# Patient Record
Sex: Female | Born: 1945
Health system: Southern US, Community
[De-identification: ages and names within clinical notes are randomized; demographics above are authoritative.]

## PROBLEM LIST (undated history)

## (undated) ENCOUNTER — Ambulatory Visit: Admission: EM

## (undated) DIAGNOSIS — N393 Stress incontinence (female) (male): Secondary | ICD-10-CM

## (undated) DIAGNOSIS — M1990 Unspecified osteoarthritis, unspecified site: Secondary | ICD-10-CM

## (undated) DIAGNOSIS — G629 Polyneuropathy, unspecified: Secondary | ICD-10-CM

## (undated) DIAGNOSIS — M797 Fibromyalgia: Secondary | ICD-10-CM

## (undated) DIAGNOSIS — I10 Essential (primary) hypertension: Secondary | ICD-10-CM

## (undated) DIAGNOSIS — R002 Palpitations: Secondary | ICD-10-CM

## (undated) DIAGNOSIS — K219 Gastro-esophageal reflux disease without esophagitis: Secondary | ICD-10-CM

## (undated) DIAGNOSIS — R0602 Shortness of breath: Secondary | ICD-10-CM

## (undated) DIAGNOSIS — R51 Headache: Secondary | ICD-10-CM

## (undated) DIAGNOSIS — I499 Cardiac arrhythmia, unspecified: Secondary | ICD-10-CM

## (undated) DIAGNOSIS — F419 Anxiety disorder, unspecified: Secondary | ICD-10-CM

## (undated) HISTORY — PX: CYSTOSCOPY: SUR368

## (undated) HISTORY — PX: FOOT SURGERY: SHX648

## (undated) HISTORY — PX: ABDOMINAL HYSTERECTOMY: SHX81

## (undated) HISTORY — PX: ARTHROSCOPY KNEE W/ DRILLING: SUR92

---

## 1981-04-19 HISTORY — PX: BARTHOLIN GLAND CYST EXCISION: SHX565

## 1987-04-20 HISTORY — PX: CHOLECYSTECTOMY: SHX55

## 2003-07-16 ENCOUNTER — Encounter: Admission: RE | Admit: 2003-07-16 | Discharge: 2003-10-14 | Payer: Self-pay | Admitting: Internal Medicine

## 2004-10-18 ENCOUNTER — Encounter: Admission: RE | Admit: 2004-10-18 | Discharge: 2004-10-18 | Payer: Self-pay | Admitting: Internal Medicine

## 2004-11-13 ENCOUNTER — Encounter: Admission: RE | Admit: 2004-11-13 | Discharge: 2004-11-13 | Payer: Self-pay | Admitting: Neurosurgery

## 2009-12-30 ENCOUNTER — Encounter: Admission: RE | Admit: 2009-12-30 | Discharge: 2009-12-30 | Payer: Self-pay | Admitting: *Deleted

## 2010-01-19 ENCOUNTER — Encounter
Admission: RE | Admit: 2010-01-19 | Discharge: 2010-03-24 | Payer: Self-pay | Source: Home / Self Care | Attending: *Deleted | Admitting: *Deleted

## 2010-04-19 HISTORY — PX: SPINAL FUSION: SHX223

## 2010-04-27 ENCOUNTER — Encounter
Admission: RE | Admit: 2010-04-27 | Discharge: 2010-04-27 | Payer: Self-pay | Source: Home / Self Care | Attending: Orthopedic Surgery | Admitting: Orthopedic Surgery

## 2011-01-26 ENCOUNTER — Other Ambulatory Visit: Payer: Self-pay | Admitting: Neurological Surgery

## 2011-01-26 DIAGNOSIS — M545 Low back pain, unspecified: Secondary | ICD-10-CM

## 2011-01-26 DIAGNOSIS — M7918 Myalgia, other site: Secondary | ICD-10-CM

## 2011-02-01 ENCOUNTER — Ambulatory Visit
Admission: RE | Admit: 2011-02-01 | Discharge: 2011-02-01 | Disposition: A | Discharge status: Home / Self Care | Payer: Medicare Other | Source: Ambulatory Visit | Attending: Neurological Surgery | Admitting: Neurological Surgery

## 2011-02-01 DIAGNOSIS — M545 Low back pain, unspecified: Secondary | ICD-10-CM

## 2011-02-01 DIAGNOSIS — M7918 Myalgia, other site: Secondary | ICD-10-CM

## 2011-03-12 ENCOUNTER — Encounter (HOSPITAL_COMMUNITY): Payer: Self-pay | Admitting: Pharmacy Technician

## 2011-03-17 ENCOUNTER — Encounter (HOSPITAL_COMMUNITY)
Admission: RE | Admit: 2011-03-17 | Discharge: 2011-03-17 | Disposition: A | Discharge status: Home / Self Care | Payer: Medicare Other | Source: Ambulatory Visit | Attending: Neurological Surgery | Admitting: Neurological Surgery

## 2011-03-17 ENCOUNTER — Other Ambulatory Visit: Payer: Self-pay

## 2011-03-17 ENCOUNTER — Encounter (HOSPITAL_COMMUNITY): Payer: Self-pay

## 2011-03-17 HISTORY — DX: Cardiac arrhythmia, unspecified: I49.9

## 2011-03-17 HISTORY — DX: Gastro-esophageal reflux disease without esophagitis: K21.9

## 2011-03-17 HISTORY — DX: Essential (primary) hypertension: I10

## 2011-03-17 HISTORY — DX: Anxiety disorder, unspecified: F41.9

## 2011-03-17 HISTORY — DX: Unspecified osteoarthritis, unspecified site: M19.90

## 2011-03-17 HISTORY — DX: Headache: R51

## 2011-03-17 HISTORY — DX: Shortness of breath: R06.02

## 2011-03-17 LAB — CBC
HCT: 36.1 % (ref 36.0–46.0)
Hemoglobin: 12 g/dL (ref 12.0–15.0)
MCH: 28.8 pg (ref 26.0–34.0)
MCHC: 33.2 g/dL (ref 30.0–36.0)
MCV: 86.6 fL (ref 78.0–100.0)
Platelets: 408 10*3/uL — ABNORMAL HIGH (ref 150–400)
RBC: 4.17 MIL/uL (ref 3.87–5.11)
RDW: 14.4 % (ref 11.5–15.5)
WBC: 7.9 10*3/uL (ref 4.0–10.5)

## 2011-03-17 LAB — DIFFERENTIAL
Basophils Absolute: 0 10*3/uL (ref 0.0–0.1)
Basophils Relative: 0 % (ref 0–1)
Eosinophils Absolute: 0.2 10*3/uL (ref 0.0–0.7)
Eosinophils Relative: 3 % (ref 0–5)
Lymphocytes Relative: 33 % (ref 12–46)
Lymphs Abs: 2.7 10*3/uL (ref 0.7–4.0)
Monocytes Absolute: 0.5 10*3/uL (ref 0.1–1.0)
Monocytes Relative: 7 % (ref 3–12)
Neutro Abs: 4.5 10*3/uL (ref 1.7–7.7)
Neutrophils Relative %: 57 % (ref 43–77)

## 2011-03-17 LAB — APTT: aPTT: 29 seconds (ref 24–37)

## 2011-03-17 LAB — TYPE AND SCREEN
ABO/RH(D): O NEG
Antibody Screen: NEGATIVE

## 2011-03-17 LAB — BASIC METABOLIC PANEL
BUN: 20 mg/dL (ref 6–23)
CO2: 28 mEq/L (ref 19–32)
Calcium: 9.6 mg/dL (ref 8.4–10.5)
Chloride: 94 mEq/L — ABNORMAL LOW (ref 96–112)
Creatinine, Ser: 1.12 mg/dL — ABNORMAL HIGH (ref 0.50–1.10)
GFR calc Af Amer: 58 mL/min — ABNORMAL LOW (ref >90)
GFR calc non Af Amer: 50 mL/min — ABNORMAL LOW (ref >90)
Glucose, Bld: 127 mg/dL — ABNORMAL HIGH (ref 70–99)
Potassium: 4.3 mEq/L (ref 3.5–5.1)
Sodium: 136 mEq/L (ref 135–145)

## 2011-03-17 LAB — PROTIME-INR
INR: 0.91 (ref 0.00–1.49)
Prothrombin Time: 12.4 seconds (ref 11.6–15.2)

## 2011-03-17 LAB — SURGICAL PCR SCREEN
MRSA, PCR: NEGATIVE
Staphylococcus aureus: NEGATIVE

## 2011-03-17 LAB — ABO/RH: ABO/RH(D): O NEG

## 2011-03-17 MED ORDER — DEXAMETHASONE SODIUM PHOSPHATE 4 MG/ML IJ SOLN: 10.0000 mg | Freq: Once | INTRAMUSCULAR | Status: DC

## 2011-03-17 MED ORDER — CHLORHEXIDINE GLUCONATE 4 % EX LIQD: Freq: Two times a day (BID) | CUTANEOUS | Status: DC

## 2011-03-17 MED ORDER — CEFAZOLIN SODIUM 1-5 GM-% IV SOLN: 1.0000 g | INTRAVENOUS | Status: DC

## 2011-03-17 NOTE — Pre-Procedure Instructions (Signed)
20 Vanessa Frank  03/17/2011   Your procedure is scheduled on:  03/26/2011  Report to [facility] at 1045 AM.  Call this number if you have problems the morning of surgery: [phone]   Remember:   Do not eat food:After Midnight.  May have clear liquids: up to 4 Hours before arrival.  Clear liquids include soda, tea, black coffee, apple or grape juice, broth.  Take these medicines the morning of surgery with A SIP OF WATER: LORCET  OXYCODONE  PRILOSEC  REGLAN  ZOLOFT   Do not wear jewelry, make-up or nail polish.  Do not wear lotions, powders, or perfumes. You may wear deodorant.  Do not shave 48 hours prior to surgery.  Do not bring valuables to the hospital.  Contacts, dentures or bridgework may not be worn into surgery.  Leave suitcase in the car. After surgery it may be brought to your room.  For patients admitted to the hospital, checkout time is 11:00 AM the day of discharge.   Patients discharged the day of surgery will not be allowed to drive home.  Name and phone number of your driver: RONMyrtie Hawk [phone]  Special Instructions: CHG Shower Use Special Wash: 1/2 bottle night before surgery and 1/2 bottle morning of surgery.   Please read over the following fact sheets that you were given: Pain Booklet, Coughing and Deep Breathing, Blood Transfusion Information, MRSA Information and Surgical Site Infection Prevention

## 2011-03-26 ENCOUNTER — Encounter (HOSPITAL_COMMUNITY): Payer: Self-pay | Admitting: Anesthesiology

## 2011-03-26 ENCOUNTER — Encounter (HOSPITAL_COMMUNITY): Payer: Self-pay | Admitting: *Deleted

## 2011-03-26 ENCOUNTER — Inpatient Hospital Stay (HOSPITAL_COMMUNITY): Payer: Medicare Other

## 2011-03-26 ENCOUNTER — Encounter (HOSPITAL_COMMUNITY): Payer: Self-pay | Admitting: Neurological Surgery

## 2011-03-26 ENCOUNTER — Encounter (HOSPITAL_COMMUNITY)
Admission: RE | Disposition: A | Discharge status: Home / Self Care | Payer: Self-pay | Source: Ambulatory Visit | Attending: Neurological Surgery

## 2011-03-26 ENCOUNTER — Inpatient Hospital Stay (HOSPITAL_COMMUNITY)
Admission: RE | Admit: 2011-03-26 | Discharge: 2011-03-29 | DRG: 455 | Disposition: A | Discharge status: Home / Self Care | Payer: Medicare Other | Source: Ambulatory Visit | Attending: Neurological Surgery | Admitting: Neurological Surgery

## 2011-03-26 ENCOUNTER — Inpatient Hospital Stay (HOSPITAL_COMMUNITY): Payer: Medicare Other | Admitting: Anesthesiology

## 2011-03-26 DIAGNOSIS — E119 Type 2 diabetes mellitus without complications: Secondary | ICD-10-CM | POA: Diagnosis present

## 2011-03-26 DIAGNOSIS — M5137 Other intervertebral disc degeneration, lumbosacral region: Principal | ICD-10-CM | POA: Diagnosis present

## 2011-03-26 DIAGNOSIS — Z981 Arthrodesis status: Secondary | ICD-10-CM

## 2011-03-26 DIAGNOSIS — M129 Arthropathy, unspecified: Secondary | ICD-10-CM | POA: Diagnosis present

## 2011-03-26 DIAGNOSIS — K5900 Constipation, unspecified: Secondary | ICD-10-CM | POA: Diagnosis not present

## 2011-03-26 DIAGNOSIS — F411 Generalized anxiety disorder: Secondary | ICD-10-CM | POA: Diagnosis present

## 2011-03-26 DIAGNOSIS — I10 Essential (primary) hypertension: Secondary | ICD-10-CM | POA: Diagnosis present

## 2011-03-26 DIAGNOSIS — K219 Gastro-esophageal reflux disease without esophagitis: Secondary | ICD-10-CM | POA: Diagnosis present

## 2011-03-26 DIAGNOSIS — M51379 Other intervertebral disc degeneration, lumbosacral region without mention of lumbar back pain or lower extremity pain: Principal | ICD-10-CM | POA: Diagnosis present

## 2011-03-26 HISTORY — PX: ANTERIOR LAT LUMBAR FUSION: SHX1168

## 2011-03-26 LAB — GLUCOSE, CAPILLARY
Glucose-Capillary: 139 mg/dL — ABNORMAL HIGH (ref 70–99)
Glucose-Capillary: 174 mg/dL — ABNORMAL HIGH (ref 70–99)
Glucose-Capillary: 190 mg/dL — ABNORMAL HIGH (ref 70–99)
Glucose-Capillary: 156 mg/dL — ABNORMAL HIGH (ref 70–99)

## 2011-03-26 SURGERY — ANTERIOR LATERAL LUMBAR FUSION 1 LEVEL
Anesthesia: General | Site: Flank | Wound class: Clean

## 2011-03-26 MED ORDER — PHENYLEPHRINE HCL 10 MG/ML IJ SOLN
10.0000 mg | INTRAVENOUS | Status: DC | PRN
  Administered 2011-03-26: 20 ug/min via INTRAVENOUS

## 2011-03-26 MED ORDER — BUPIVACAINE HCL (PF) 0.25 % IJ SOLN
INTRAMUSCULAR | Status: DC | PRN
  Administered 2011-03-26: 50 mL

## 2011-03-26 MED ORDER — METOCLOPRAMIDE HCL 10 MG PO TABS
10.0000 mg | ORAL_TABLET | Freq: Two times a day (BID) | ORAL | Status: DC
  Administered 2011-03-26 – 2011-03-29 (×5): 10 mg via ORAL
  Filled 2011-03-26 (×7): qty 1

## 2011-03-26 MED ORDER — METHOCARBAMOL 100 MG/ML IJ SOLN
500.0000 mg | INTRAVENOUS | Status: AC
  Filled 2011-03-26: qty 5

## 2011-03-26 MED ORDER — PANTOPRAZOLE SODIUM 40 MG PO TBEC
40.0000 mg | DELAYED_RELEASE_TABLET | Freq: Every day | ORAL | Status: DC
  Administered 2011-03-28 – 2011-03-29 (×2): 40 mg via ORAL
  Filled 2011-03-26 (×2): qty 1

## 2011-03-26 MED ORDER — LISINOPRIL 10 MG PO TABS
10.0000 mg | ORAL_TABLET | Freq: Every day | ORAL | Status: DC
  Administered 2011-03-26 – 2011-03-28 (×2): 10 mg via ORAL
  Filled 2011-03-26 (×4): qty 1

## 2011-03-26 MED ORDER — SODIUM CHLORIDE 0.9 % IJ SOLN
3.0000 mL | Freq: Two times a day (BID) | INTRAMUSCULAR | Status: DC
  Administered 2011-03-26 – 2011-03-29 (×5): 3 mL via INTRAVENOUS

## 2011-03-26 MED ORDER — LACTATED RINGERS IV SOLN
INTRAVENOUS | Status: DC | PRN
  Administered 2011-03-26 (×2): via INTRAVENOUS

## 2011-03-26 MED ORDER — MENTHOL 3 MG MT LOZG: 1.0000 | LOZENGE | OROMUCOSAL | Status: DC | PRN

## 2011-03-26 MED ORDER — MEPERIDINE HCL 25 MG/ML IJ SOLN: 6.2500 mg | INTRAMUSCULAR | Status: DC | PRN

## 2011-03-26 MED ORDER — LIDOCAINE HCL (CARDIAC) 20 MG/ML IV SOLN
INTRAVENOUS | Status: DC | PRN
  Administered 2011-03-26: 80 mg via INTRAVENOUS

## 2011-03-26 MED ORDER — PROPOFOL 10 MG/ML IV EMUL
INTRAVENOUS | Status: DC | PRN
  Administered 2011-03-26: 160 mg via INTRAVENOUS

## 2011-03-26 MED ORDER — ACETAMINOPHEN 650 MG RE SUPP: 650.0000 mg | RECTAL | Status: DC | PRN

## 2011-03-26 MED ORDER — METFORMIN HCL 500 MG PO TABS
1000.0000 mg | ORAL_TABLET | Freq: Two times a day (BID) | ORAL | Status: DC
  Administered 2011-03-26 – 2011-03-29 (×6): 1000 mg via ORAL
  Filled 2011-03-26 (×9): qty 2

## 2011-03-26 MED ORDER — ACETAMINOPHEN 325 MG PO TABS: 650.0000 mg | ORAL_TABLET | ORAL | Status: DC | PRN

## 2011-03-26 MED ORDER — LACTATED RINGERS IV SOLN
INTRAVENOUS | Status: DC | PRN
  Administered 2011-03-26: 07:00:00 via INTRAVENOUS

## 2011-03-26 MED ORDER — DEXAMETHASONE SODIUM PHOSPHATE 10 MG/ML IJ SOLN
INTRAMUSCULAR | Status: AC
  Filled 2011-03-26: qty 1

## 2011-03-26 MED ORDER — CEFAZOLIN SODIUM 1-5 GM-% IV SOLN
1.0000 g | Freq: Three times a day (TID) | INTRAVENOUS | Status: AC
  Administered 2011-03-26: 18:00:00 via INTRAVENOUS
  Administered 2011-03-27: 1 g via INTRAVENOUS
  Filled 2011-03-26 (×2): qty 50

## 2011-03-26 MED ORDER — HYDROMORPHONE HCL PF 1 MG/ML IJ SOLN
0.5000 mg | INTRAMUSCULAR | Status: DC | PRN
  Administered 2011-03-26: 0.5 mg via INTRAVENOUS
  Administered 2011-03-26: 1 mg via INTRAVENOUS
  Administered 2011-03-26: 0.5 mg via INTRAVENOUS
  Administered 2011-03-27 – 2011-03-28 (×7): 1 mg via INTRAVENOUS
  Filled 2011-03-26 (×8): qty 1

## 2011-03-26 MED ORDER — SENNA 8.6 MG PO TABS
1.0000 | ORAL_TABLET | Freq: Two times a day (BID) | ORAL | Status: DC
  Administered 2011-03-26 – 2011-03-29 (×6): 8.6 mg via ORAL
  Filled 2011-03-26 (×7): qty 1

## 2011-03-26 MED ORDER — SODIUM CHLORIDE 0.9 % IJ SOLN: 3.0000 mL | INTRAMUSCULAR | Status: DC | PRN

## 2011-03-26 MED ORDER — SIMVASTATIN 20 MG PO TABS
20.0000 mg | ORAL_TABLET | Freq: Every day | ORAL | Status: DC
  Administered 2011-03-26 – 2011-03-28 (×3): 20 mg via ORAL
  Filled 2011-03-26 (×4): qty 1

## 2011-03-26 MED ORDER — FUROSEMIDE 40 MG PO TABS
40.0000 mg | ORAL_TABLET | Freq: Every day | ORAL | Status: DC
  Administered 2011-03-26 – 2011-03-29 (×3): 40 mg via ORAL
  Filled 2011-03-26 (×4): qty 1

## 2011-03-26 MED ORDER — PROMETHAZINE HCL 25 MG/ML IJ SOLN: 6.2500 mg | INTRAMUSCULAR | Status: DC | PRN

## 2011-03-26 MED ORDER — DEXTROSE 5 % IV SOLN
INTRAVENOUS | Status: DC | PRN
  Administered 2011-03-26: 08:00:00 via INTRAVENOUS

## 2011-03-26 MED ORDER — DEXAMETHASONE SODIUM PHOSPHATE 10 MG/ML IJ SOLN
10.0000 mg | Freq: Once | INTRAMUSCULAR | Status: AC
  Administered 2011-03-26: 10 mg via INTRAVENOUS
  Filled 2011-03-26: qty 1

## 2011-03-26 MED ORDER — METHOCARBAMOL 100 MG/ML IJ SOLN
500.0000 mg | Freq: Four times a day (QID) | INTRAVENOUS | Status: DC | PRN
  Administered 2011-03-26: 500 mg via INTRAVENOUS
  Filled 2011-03-26 (×2): qty 5

## 2011-03-26 MED ORDER — ONDANSETRON HCL 4 MG/2ML IJ SOLN
4.0000 mg | INTRAMUSCULAR | Status: DC | PRN
  Administered 2011-03-27: 4 mg via INTRAVENOUS
  Filled 2011-03-26: qty 2

## 2011-03-26 MED ORDER — HYDROMORPHONE HCL PF 1 MG/ML IJ SOLN
0.2500 mg | INTRAMUSCULAR | Status: DC | PRN
  Administered 2011-03-26 (×4): 0.5 mg via INTRAVENOUS

## 2011-03-26 MED ORDER — PHENOL 1.4 % MT LIQD: 1.0000 | OROMUCOSAL | Status: DC | PRN

## 2011-03-26 MED ORDER — POTASSIUM CHLORIDE IN NACL 20-0.9 MEQ/L-% IV SOLN
INTRAVENOUS | Status: DC
  Administered 2011-03-26: 18:00:00 via INTRAVENOUS
  Filled 2011-03-26 (×7): qty 1000

## 2011-03-26 MED ORDER — MIDAZOLAM HCL 5 MG/5ML IJ SOLN
INTRAMUSCULAR | Status: DC | PRN
  Administered 2011-03-26: 2 mg via INTRAVENOUS

## 2011-03-26 MED ORDER — METHOCARBAMOL 500 MG PO TABS
500.0000 mg | ORAL_TABLET | Freq: Four times a day (QID) | ORAL | Status: DC | PRN
  Administered 2011-03-27 – 2011-03-28 (×4): 500 mg via ORAL
  Filled 2011-03-26 (×5): qty 1

## 2011-03-26 MED ORDER — CEFAZOLIN SODIUM-DEXTROSE 2-3 GM-% IV SOLR
2.0000 g | Freq: Once | INTRAVENOUS | Status: AC
  Administered 2011-03-26: 2 g via INTRAVENOUS

## 2011-03-26 MED ORDER — SERTRALINE HCL 100 MG PO TABS
100.0000 mg | ORAL_TABLET | Freq: Every day | ORAL | Status: DC
  Administered 2011-03-28 – 2011-03-29 (×2): 100 mg via ORAL
  Filled 2011-03-26 (×4): qty 1

## 2011-03-26 MED ORDER — SODIUM CHLORIDE 0.9 % IR SOLN
Status: DC | PRN
  Administered 2011-03-26: 1000 mL

## 2011-03-26 MED ORDER — FENTANYL CITRATE 0.05 MG/ML IJ SOLN
INTRAMUSCULAR | Status: DC | PRN
  Administered 2011-03-26 (×2): 100 ug via INTRAVENOUS
  Administered 2011-03-26 (×2): 50 ug via INTRAVENOUS

## 2011-03-26 MED ORDER — SODIUM CHLORIDE 0.9 % IV SOLN
250.0000 mL | INTRAVENOUS | Status: DC
  Administered 2011-03-26: 250 mL via INTRAVENOUS

## 2011-03-26 MED ORDER — SODIUM CHLORIDE 0.9 % IR SOLN
Status: DC | PRN
  Administered 2011-03-26: 09:00:00

## 2011-03-26 MED ORDER — ROCURONIUM BROMIDE 100 MG/10ML IV SOLN
INTRAVENOUS | Status: DC | PRN
  Administered 2011-03-26: 30 mg via INTRAVENOUS

## 2011-03-26 MED ORDER — EPHEDRINE SULFATE 50 MG/ML IJ SOLN
INTRAMUSCULAR | Status: DC | PRN
  Administered 2011-03-26: 10 mg via INTRAVENOUS

## 2011-03-26 MED ORDER — CEFAZOLIN SODIUM-DEXTROSE 2-3 GM-% IV SOLR
INTRAVENOUS | Status: AC
  Filled 2011-03-26: qty 50

## 2011-03-26 MED ORDER — ONDANSETRON HCL 4 MG/2ML IJ SOLN
INTRAMUSCULAR | Status: DC | PRN
  Administered 2011-03-26: 4 mg via INTRAVENOUS

## 2011-03-26 MED ORDER — OXYCODONE-ACETAMINOPHEN 5-325 MG PO TABS
1.0000 | ORAL_TABLET | ORAL | Status: DC | PRN
  Administered 2011-03-26 – 2011-03-27 (×3): 2 via ORAL
  Administered 2011-03-27: 1 via ORAL
  Administered 2011-03-28 – 2011-03-29 (×5): 2 via ORAL
  Administered 2011-03-29: 1 via ORAL
  Administered 2011-03-29: 2 via ORAL
  Filled 2011-03-26 (×2): qty 1
  Filled 2011-03-26 (×7): qty 2
  Filled 2011-03-26: qty 1
  Filled 2011-03-26 (×2): qty 2

## 2011-03-26 MED ORDER — PIOGLITAZONE HCL 30 MG PO TABS
30.0000 mg | ORAL_TABLET | Freq: Every day | ORAL | Status: DC
  Administered 2011-03-28 – 2011-03-29 (×2): 30 mg via ORAL
  Filled 2011-03-26 (×3): qty 1

## 2011-03-26 SURGICAL SUPPLY — 73 items
ADH SKN CLS APL DERMABOND .7 (GAUZE/BANDAGES/DRESSINGS) ×4
APL SKNCLS STERI-STRIP NONHPOA (GAUZE/BANDAGES/DRESSINGS) ×4
BAG DECANTER FOR FLEXI CONT (MISCELLANEOUS) ×6 IMPLANT
BENZOIN TINCTURE PRP APPL 2/3 (GAUZE/BANDAGES/DRESSINGS) ×6 IMPLANT
BLADE SURG ROTATE 9660 (MISCELLANEOUS) IMPLANT
BONE MATRIX OSTEOCEL PLUS 5CC (Bone Implant) ×1 IMPLANT
BUR MATCHSTICK NEURO 3.0 LAGG (BURR) ×2 IMPLANT
CANISTER SUCTION 2500CC (MISCELLANEOUS) ×3 IMPLANT
CLOTH BEACON ORANGE TIMEOUT ST (SAFETY) ×5 IMPLANT
CONT SPEC 4OZ CLIKSEAL STRL BL (MISCELLANEOUS) ×4 IMPLANT
COVER BACK TABLE 24X17X13 BIG (DRAPES) IMPLANT
COVER TABLE BACK 60X90 (DRAPES) ×2 IMPLANT
DERMABOND ADVANCED (GAUZE/BANDAGES/DRESSINGS) ×2
DERMABOND ADVANCED .7 DNX12 (GAUZE/BANDAGES/DRESSINGS) ×4 IMPLANT
DRAPE C-ARM 42X72 X-RAY (DRAPES) ×8 IMPLANT
DRAPE C-ARMOR (DRAPES) ×3 IMPLANT
DRAPE LAPAROTOMY 100X72X124 (DRAPES) ×6 IMPLANT
DRAPE POUCH INSTRU U-SHP 10X18 (DRAPES) ×5 IMPLANT
DRAPE SURG 17X23 STRL (DRAPES) ×14 IMPLANT
DRESSING TELFA 8X3 (GAUZE/BANDAGES/DRESSINGS) ×5 IMPLANT
DRSG OPSITE 4X5.5 SM (GAUZE/BANDAGES/DRESSINGS) ×10 IMPLANT
DURAPREP 26ML APPLICATOR (WOUND CARE) ×5 IMPLANT
ELECT REM PT RETURN 9FT ADLT (ELECTROSURGICAL) ×3
ELECTRODE REM PT RTRN 9FT ADLT (ELECTROSURGICAL) ×4 IMPLANT
EVACUATOR 1/8 PVC DRAIN (DRAIN) ×3 IMPLANT
GAUZE SPONGE 4X4 16PLY XRAY LF (GAUZE/BANDAGES/DRESSINGS) IMPLANT
GLOVE BIO SURGEON STRL SZ8 (GLOVE) ×6 IMPLANT
GLOVE BIOGEL PI IND STRL 7.0 (GLOVE) IMPLANT
GLOVE BIOGEL PI IND STRL 8 (GLOVE) IMPLANT
GLOVE BIOGEL PI INDICATOR 7.0 (GLOVE) ×1
GLOVE BIOGEL PI INDICATOR 8 (GLOVE) ×1
GLOVE SURG SS PI 6.5 STRL IVOR (GLOVE) ×4 IMPLANT
GLOVE SURG SS PI 7.5 STRL IVOR (GLOVE) ×3 IMPLANT
GLOVE SURG SS PI 8.0 STRL IVOR (GLOVE) ×2 IMPLANT
GOWN BRE IMP SLV AUR LG STRL (GOWN DISPOSABLE) ×1 IMPLANT
GOWN BRE IMP SLV AUR XL STRL (GOWN DISPOSABLE) ×6 IMPLANT
GOWN STRL REIN 2XL LVL4 (GOWN DISPOSABLE) ×1 IMPLANT
HEMOSTAT POWDER KIT SURGIFOAM (HEMOSTASIS) IMPLANT
IMPL COROENT XL 8X8X45 (Intraocular Lens) IMPLANT
IMPLANT COROENT XL 8X8X45 (Intraocular Lens) ×3 IMPLANT
K-WIRE NITOL (WIRE) ×2 IMPLANT
KIT BASIN OR (CUSTOM PROCEDURE TRAY) ×5 IMPLANT
KIT MAXCESS (KITS) ×1 IMPLANT
KIT ROOM TURNOVER OR (KITS) ×5 IMPLANT
M5 I-PAS III(DIAMOND TIP) INSULATED PEDICLE ACCESS SYSTEM ×1 IMPLANT
NDL HYPO 25X1 1.5 SAFETY (NEEDLE) ×4 IMPLANT
NEEDLE HYPO 25X1 1.5 SAFETY (NEEDLE) ×3 IMPLANT
NS IRRIG 1000ML POUR BTL (IV SOLUTION) ×5 IMPLANT
NVM5 EMG NEEDLE MODULE ×1 IMPLANT
PACK LAMINECTOMY NEURO (CUSTOM PROCEDURE TRAY) ×5 IMPLANT
PAD ARMBOARD 7.5X6 YLW CONV (MISCELLANEOUS) ×9 IMPLANT
PUTTY BONE DBX 2.5 MIS (Bone Implant) ×1 IMPLANT
ROD 45MM (Rod) ×1 IMPLANT
SCREW POLYAXIAL 6.5X45MM (Screw) ×1 IMPLANT
SCREW PRECEPT SET (Screw) ×2 IMPLANT
SPONGE LAP 4X18 X RAY DECT (DISPOSABLE) IMPLANT
SPONGE SURGIFOAM ABS GEL 100 (HEMOSTASIS) ×2 IMPLANT
STRIP CLOSURE SKIN 1/2X4 (GAUZE/BANDAGES/DRESSINGS) ×7 IMPLANT
SUT VIC AB 0 CT1 18XCR BRD8 (SUTURE) ×4 IMPLANT
SUT VIC AB 0 CT1 8-18 (SUTURE) ×3
SUT VIC AB 2-0 CP2 18 (SUTURE) ×5 IMPLANT
SUT VIC AB 3-0 SH 8-18 (SUTURE) ×8 IMPLANT
SWABSTICK BENZOIN STERILE (MISCELLANEOUS) ×2 IMPLANT
SYR 20ML ECCENTRIC (SYRINGE) ×5 IMPLANT
Screw 5.5x45mm ×1 IMPLANT
TAPE CLOTH 3X10 TAN LF (GAUZE/BANDAGES/DRESSINGS) ×9 IMPLANT
TOWEL OR 17X24 6PK STRL BLUE (TOWEL DISPOSABLE) ×5 IMPLANT
TOWEL OR 17X26 10 PK STRL BLUE (TOWEL DISPOSABLE) ×6 IMPLANT
TRAP SPECIMEN MUCOUS 40CC (MISCELLANEOUS) IMPLANT
TRAY FOLEY BAG SILVER LF 14FR (CATHETERS) ×1 IMPLANT
TRAY FOLEY CATH 14FRSI W/METER (CATHETERS) ×4 IMPLANT
WATER STERILE IRR 1000ML POUR (IV SOLUTION) ×5 IMPLANT
XLIF DISPOSABLE KIT ×1 IMPLANT

## 2011-03-26 NOTE — H&P (Signed)
Subjective: Patient is a 65 y.o. female admitted for XLIF L3-4. Onset of symptoms was a few yrs ago, slowly progressive since that time.  The pain is rated as significant and variable , and is located at the low back and radiates to thighs. The pain is described as aching and occurs intermittently. The symptoms have been progressive. Symptoms are exacerbated by walking, standing. MRI or CT showed severe DDD L3-4.  Past Medical History  Diagnosis Date  . Hypertension   . Dysrhythmia IRREGULAR  RATE IN HER 20'S  . Shortness of breath WITH EXERTION  . Diabetes mellitus ORAL MEDS ONLY  . GERD (gastroesophageal reflux disease)   . Headache   . Neuromuscular disorder   . Arthritis   . Anxiety     Past Surgical History  Procedure Date  . Abdominal hysterectomy 80'S  . Cholecystectomy 90'S  . Bartholin gland cyst excision 80'S  . Arthroscopy knee w/ drilling 09 LEFT   2012 RIGHT  . Cystoscopy 80'S    Prior to Admission medications   Medication Sig Start Date End Date Taking? Authorizing Provider  atorvastatin (LIPITOR) 10 MG tablet Take 10 mg by mouth daily.     Yes Historical Provider, MD  B Complex Vitamins (VITAMIN B COMPLEX IJ) Inject 1 mL as directed every 30 (thirty) days.     Yes Historical Provider, MD  Calcium Carbonate-Vitamin D (CALCIUM + D PO) Take 1 tablet by mouth daily.     Yes Historical Provider, MD  furosemide (LASIX) 40 MG tablet Take 40 mg by mouth daily.     Yes Historical Provider, MD  hydrocodone-acetaminophen (LORCET-HD) 5-500 MG per capsule Take 1 capsule by mouth 2 (two) times daily.     Yes Historical Provider, MD  lisinopril (PRINIVIL,ZESTRIL) 10 MG tablet Take 10 mg by mouth daily.     Yes Historical Provider, MD  meloxicam (MOBIC) 7.5 MG tablet Take 7.5 mg by mouth daily.     Yes Historical Provider, MD  metFORMIN (GLUCOPHAGE) 1000 MG tablet Take 1,000 mg by mouth 2 (two) times daily with a meal.     Yes Historical Provider, MD  metoCLOPramide (REGLAN) 10 MG  tablet Take 10 mg by mouth 2 (two) times daily.     Yes Historical Provider, MD  Multiple Vitamins-Minerals (MULTIVITAMINS THER. W/MINERALS) TABS Take 1 tablet by mouth daily.     Yes Historical Provider, MD  omeprazole (PRILOSEC) 20 MG capsule Take 20 mg by mouth 2 (two) times daily.     Yes Historical Provider, MD  pioglitazone (ACTOS) 30 MG tablet Take 30 mg by mouth daily.     Yes Historical Provider, MD  sertraline (ZOLOFT) 100 MG tablet Take 100 mg by mouth daily.     Yes Historical Provider, MD   Allergies  Allergen Reactions  . Adhesive (Tape) Itching  . Codeine Nausea Only  . Latex Itching    History  Substance Use Topics  . Smoking status: Not on file  . Smokeless tobacco: Not on file  . Alcohol Use: No    History reviewed. No pertinent family history.   Review of Systems  Positive ROS: neg  All other systems have been reviewed and were otherwise negative with the exception of those mentioned in the HPI and as above.  Objective: Vital signs in last 24 hours: Temp:  [98.2 F (36.8 C)] 98.2 F (36.8 C) (12/07 4098) Pulse Rate:  [96] 96  (12/07 0608) Resp:  [18] 18  (12/07 0608) BP: (115)/(77) 115/77  mmHg (12/07 0608) SpO2:  [97 %] 97 % (12/07 0454)  General Appearance: Alert, cooperative, no distress, appears stated age Head: Normocephalic, without obvious abnormality, atraumatic Eyes: PERRL, conjunctiva/corneas clear, EOM's intact, fundi benign, both eyes      Ears: Normal TM's and external ear canals, both ears Throat: Lips, mucosa, and tongue normal; teeth and gums normal Neck: Supple, symmetrical, trachea midline, no adenopathy; thyroid: No enlargement/tenderness/nodules; no carotid bruit or JVD Back: Symmetric, no curvature, ROM normal, no CVA tenderness Lungs: Clear to auscultation bilaterally, respirations unlabored Heart: Regular rate and rhythm, S1 and S2 normal, no murmur, rub or gallop Abdomen: Soft, non-tender, bowel sounds active all four quadrants,  no masses, no organomegaly Extremities: Extremities normal, atraumatic, no cyanosis or edema Pulses: 2+ and symmetric all extremities Skin: Skin color, texture, turgor normal, no rashes or lesions  NEUROLOGIC:   Mental status: Alert and oriented x4,  no aphasia, good attention span, fund of knowledge, and memory Motor Exam - grossly normal Sensory Exam - grossly normal Reflexes: 1+ Coordination - grossly normal Gait - grossly normal Balance - grossly normal Cranial Nerves: I: smell Not tested  II: visual acuity  OS: nl    OD: nl  II: visual fields Full to confrontation  II: pupils Equal, round, reactive to light  III,VII: ptosis None  III,IV,VI: extraocular muscles  Full ROM  V: mastication Normal  V: facial light touch sensation  Normal  V,VII: corneal reflex  Present  VII: facial muscle function - upper  Normal  VII: facial muscle function - lower Normal  VIII: hearing Not tested  IX: soft palate elevation  Normal  IX,X: gag reflex Present  XI: trapezius strength  5/5  XI: sternocleidomastoid strength 5/5  XI: neck flexion strength  5/5  XII: tongue strength  Normal    Data Review Lab Results  Component Value Date   WBC 7.9 03/17/2011   HGB 12.0 03/17/2011   HCT 36.1 03/17/2011   MCV 86.6 03/17/2011   PLT 408* 03/17/2011   Lab Results  Component Value Date   NA 136 03/17/2011   K 4.3 03/17/2011   CL 94* 03/17/2011   CO2 28 03/17/2011   BUN 20 03/17/2011   CREATININE 1.12* 03/17/2011   GLUCOSE 127* 03/17/2011   Lab Results  Component Value Date   INR 0.91 03/17/2011    Assessment/Plan: Patient admitted for XLIF L3-4 with Perc screws. Patient has failed conservative therapy.  I explained the condition and procedure to the patient and answered any questions.  Patient wishes to proceed with procedure as planned. Understands risks/ benefits and typical outcomes of procedure.   Kathy Wahid S 03/26/2011 6:53 AM

## 2011-03-26 NOTE — Anesthesia Procedure Notes (Signed)
Procedure Name: Intubation Date/Time: 03/26/2011 7:59 AM Performed by: Tyrone Nine Pre-anesthesia Checklist: Patient identified, Emergency Drugs available, Suction available and Patient being monitored Patient Re-evaluated:Patient Re-evaluated prior to inductionOxygen Delivery Method: Circle System Utilized Preoxygenation: Pre-oxygenation with 100% oxygen Intubation Type: IV induction Ventilation: Mask ventilation without difficulty Laryngoscope Size: Mac and 3 Grade View: Grade II Tube type: Oral Tube size: 7.5 mm Number of attempts: 1 Airway Equipment and Method: patient positioned with wedge pillow and stylet Placement Confirmation: ETT inserted through vocal cords under direct vision,  positive ETCO2 and breath sounds checked- equal and bilateral Secured at: 21 cm Tube secured with: Tape Dental Injury: Teeth and Oropharynx as per pre-operative assessment

## 2011-03-26 NOTE — Op Note (Signed)
03/26/2011  10:56 AM  PATIENT:  Vanessa Frank  65 y.o. female  PRE-OPERATIVE DIAGNOSIS:  Degenerative disc disease L3-4 with scoliosis and neural foraminal stenosis L3-4 on the right with back and right leg pain  POST-OPERATIVE DIAGNOSIS:  Same  PROCEDURE:  1. Anterolateral retroperitoneal interbody fusion L3-4 utilizing a peek interbody cage packed with morcellized allograft, 2. Posterior arthrodesis L3-4 Liza morcellized, 3. Nonsegmental fixation L3-4 utilizing percutaneous pedicle screws  SURGEON:  Marikay Alar, MD  ASSISTANTS: Dr. Gerlene Fee  ANESTHESIA:   General  EBL: 20 ml  Total I/O In: 2000 [I.V.:2000] Out: 250 [Urine:200; Blood:50]  BLOOD ADMINISTERED:none  DRAINS: None   SPECIMEN:  No Specimen  INDICATION FOR PROCEDURE: This patient presented with a long history of back pain with radiation into the right leg. She had MRI and plain films showing degenerative disc disease at L3-4 with scoliosis and collapse of the foramen on the right. She saw 2 separate surgeons who recommended a fusion at L3-4. She tried medical management quite some time without relief. Patient understood the risks, benefits, and alternatives and potential outcomes and wished to proceed with surgery in hopes of improving her pain.  PROCEDURE DETAILS: The patient was brought to the operating room and after induction of adequate generalized endotracheal anesthesia, the patient was positioned in the right lateral decubitus position exposing the left side. The patient was positioned in the typical XLIF fashion and taped into position and trajectories were confirmed with AP lateral fluoroscopy. The skin was cleaned and then prepped with DuraPrep and draped in the usual sterile fashion. 5 cc of local anesthesia was injected. A lateral incision was made over the appropriate disc space and a incision was made posterior lateral to this. Blunt finger dissection was used to enter the retroperitoneal space. I could  palpate the psoas musculature as well as the anterior face of the transverse process, and I could feel the fatty tissues of the retroperitoneum. I swept my finger up to the lateral incision and passed my first dilator down to psoas musculature utilizing my index finger. We then used EMG monitoring to monitor our dilator. We checked our position with fluoroscopy and then placed a K wire into the L3-4 disc space, and then used sequential dilators until our final retractor was in place. We then positioned it utilizing AP lateral fluoroscopy, and used our ball probe to make sure there were no neural structures within the working channel. I then placed the intradiscal shim at L3-4, and opened my retractor further. The annulus was then incised and the discectomy was done with pituitaries. The annulus was removed from the endplates with a Cobb and the opposite annulus was opened and released. I then used a series of scrapers and shavers to prepare the endplates, taking care not to violate the endplates. I then placed my 16 mm paddle across the disc space to further open opposite annulus and I checked the trajectory with AP and lateral fluoroscopy. I then used sequential trials to determine the correct size cage. The 8 mm standard trial fit the best, so a corresponding cage was selected and packed with morcellized allograft. Using sliders it was then tapped gently into the disc space utilizing AP fluoroscopy until it was appropriately positioned. I then irrigated with saline solution containing bacitracin and dried any bleeding points. I checked my final construct with AP lateral fluoroscopy, removed my retractor, and closed the incisions with layers of 0 Vicryl in the fascia, 2-0 Vicryl in the subcutaneous tissues, and  3-0 Vicryl in the subcuticular tissues. The skin was closed with Dermabond.  AP and lateral fluoroscopy was used to determine landmarks for left-sided percutaneous pedicle screws. Stab incisions were made  over the pedicles and a Jamshidi needle was passed down to identify the transverse process and the pedicle screw entry zone. Utilizing AP fluoroscopy, we started the needle at the lateral border of the pedicle and tapped it into the pedicle into it reached the medial border of the pedicle on the AP image. EMG monitoring was used. We then checked our lateral fluoroscopy to assure our needle was in good position, and then passed our K wire into the vertebral body. We did this for each pedicle in the same way. We were then able to tap over each K wire, measured the correct length of our screws, and then place our pedicle screws over the K wires until they were in correct position. The caps and the screws were monitored. We then passed our rod through the towers and locked into position with locking caps and antitorque device. We then checked our final construct with AP lateral fluoroscopy. Then irrigated with saline solution containing bacitracin and dried all bleeding points. The wounds were then closed in layers of 0 Vicryl in the fascia, 2-0 Vicryl in the subcutaneous tissues, and 3-0 Vicryl in the subcuticular tissues. The skin was closed with Dermabond.  the patient was awakened from general anesthesia and transferred to the recovery in stable condition. At the end of the procedure all sponge, needle and instrument counts were correct.  PLAN OF CARE: Admit to inpatient   PATIENT DISPOSITION:  PACU - hemodynamically stable.   Delay start of Pharmacological VTE agent (>24hrs) due to surgical blood loss or risk of bleeding:  yes

## 2011-03-26 NOTE — Anesthesia Postprocedure Evaluation (Signed)
  Anesthesia Post-op Note  Patient: Vanessa Frank  Procedure(s) Performed:  ANTERIOR LATERAL LUMBAR FUSION 1 LEVEL - Left Lumbar three-four anterolateral retroperitoneal interbody fusion ; POSTERIOR LUMBAR FUSION 1 LEVEL - Left Lumbar three-four percutaneous pedicle screws  Patient Location: PACU  Anesthesia Type: General  Level of Consciousness: awake  Airway and Oxygen Therapy: Patient Spontanous Breathing  Post-op Pain: mild  Post-op Assessment: Post-op Vital signs reviewed  Post-op Vital Signs: stable  Complications: No apparent anesthesia complications

## 2011-03-26 NOTE — Transfer of Care (Signed)
Immediate Anesthesia Transfer of Care Note  Patient: Vanessa Frank  Procedure(s) Performed:  ANTERIOR LATERAL LUMBAR FUSION 1 LEVEL - Left Lumbar three-four anterolateral retroperitoneal interbody fusion ; POSTERIOR LUMBAR FUSION 1 LEVEL - Left Lumbar three-four percutaneous pedicle screws  Patient Location: PACU  Anesthesia Type: General  Level of Consciousness: sedated, patient cooperative and responds to stimulation  Airway & Oxygen Therapy: Patient Spontanous Breathing and Patient connected to nasal cannula oxygen  Post-op Assessment: Report given to PACU RN and Post -op Vital signs reviewed and stable  Post vital signs: Reviewed and stable  Complications: No apparent anesthesia complications

## 2011-03-26 NOTE — OR Nursing (Signed)
Needle electrodes placed for Nuvasive STIM monitoring by A. Christell Constant, RN. Removed at end of case.

## 2011-03-26 NOTE — Anesthesia Preprocedure Evaluation (Addendum)
Anesthesia Evaluation  Patient identified by MRN, date of birth, ID band Patient awake    Reviewed: Allergy & Precautions, H&P , NPO status , Patient's Chart, lab work & pertinent test results  History of Anesthesia Complications (+) PONV  Airway Mallampati: II TM Distance: >3 FB Neck ROM: Full    Dental  (+) Teeth Intact,    Pulmonary  clear to auscultation        Cardiovascular Exercise Tolerance: Good hypertension, Pt. on medications Regular Normal    Neuro/Psych  Headaches,    GI/Hepatic GERD-  Medicated and Controlled,  Endo/Other  Diabetes mellitus-, Type 2, Oral Hypoglycemic Agents  Renal/GU      Musculoskeletal   Abdominal (+) obese,   Peds  Hematology   Anesthesia Other Findings   Reproductive/Obstetrics                        Anesthesia Physical Anesthesia Plan  ASA: III  Anesthesia Plan: General   Post-op Pain Management:    Induction: Intravenous  Airway Management Planned: Oral ETT  Additional Equipment:   Intra-op Plan:   Post-operative Plan: Extubation in OR  Informed Consent: I have reviewed the patients History and Physical, chart, labs and discussed the procedure including the risks, benefits and alternatives for the proposed anesthesia with the patient or authorized representative who has indicated his/her understanding and acceptance.   Dental advisory given  Plan Discussed with: CRNA  Anesthesia Plan Comments:        Anesthesia Quick Evaluation

## 2011-03-26 NOTE — Preoperative (Signed)
Beta Blockers   Reason not to administer Beta Blockers:Not Applicable 

## 2011-03-27 ENCOUNTER — Encounter (HOSPITAL_COMMUNITY): Payer: Self-pay | Admitting: *Deleted

## 2011-03-27 LAB — GLUCOSE, CAPILLARY
Glucose-Capillary: 148 mg/dL — ABNORMAL HIGH (ref 70–99)
Glucose-Capillary: 137 mg/dL — ABNORMAL HIGH (ref 70–99)

## 2011-03-27 MED ORDER — INFLUENZA VIRUS VACC SPLIT PF IM SUSP
0.5000 mL | INTRAMUSCULAR | Status: AC
  Administered 2011-03-28: 0.5 mL via INTRAMUSCULAR
  Filled 2011-03-27: qty 0.5

## 2011-03-27 NOTE — Progress Notes (Signed)
Physical Therapy Evaluation Patient Details Name: Vanessa Frank MRN: [id number] DOB: Dec 14, 1945 Today's Date: 03/27/2011  Problem List: There is no problem list on file for this patient.   Past Medical History:  Past Medical History  Diagnosis Date  . Hypertension   . Dysrhythmia IRREGULAR  RATE IN HER 20'S  . Shortness of breath WITH EXERTION  . Diabetes mellitus ORAL MEDS ONLY  . GERD (gastroesophageal reflux disease)   . Headache   . Neuromuscular disorder   . Arthritis   . Anxiety    Past Surgical History:  Past Surgical History  Procedure Date  . Abdominal hysterectomy 80'S  . Cholecystectomy 90'S  . Bartholin gland cyst excision 80'S  . Arthroscopy knee w/ drilling 09 LEFT   2012 RIGHT  . Cystoscopy 80'S    PT Assessment/Plan/Recommendation PT Assessment Clinical Impression Statement: pt is a 65 y/o female s/p L34 ant/post arthrodesis.  pt can benefit from PT on acute and at home to improve her safety and independence with mobility. PT Recommendation/Assessment: Patient will need skilled PT in the acute care venue PT Problem List: Decreased strength;Decreased activity tolerance;Decreased mobility;Decreased knowledge of use of DME;Decreased safety awareness;Decreased knowledge of precautions;Pain Barriers to Discharge: Decreased caregiver support PT Therapy Diagnosis : Generalized weakness;Acute pain PT Plan PT Frequency: Min 5X/week PT Treatment/Interventions: DME instruction;Gait training;Stair training;Functional mobility training;Therapeutic activities;Patient/family education PT Recommendation Follow Up Recommendations: Home health PT Equipment Recommended: 3 in 1 bedside comode;Rolling walker with 5" wheels PT Goals  Acute Rehab PT Goals PT Goal Formulation: With patient Time For Goal Achievement: 7 days Pt will go Supine/Side to Sit: with supervision PT Goal: Supine/Side to Sit - Progress: Other (comment) Pt will go Sit to Supine/Side: with  supervision PT Goal: Sit to Supine/Side - Progress: Other (comment) Pt will go Sit to Stand: with supervision PT Goal: Sit to Stand - Progress: Other (comment) Pt will Transfer Bed to Chair/Chair to Bed: with supervision PT Transfer Goal: Bed to Chair/Chair to Bed - Progress: Other (comment) Pt will Ambulate: >150 feet;with supervision;with least restrictive assistive device PT Goal: Ambulate - Progress: Other (comment) Pt will Go Up / Down Stairs: 3-5 stairs;with supervision;with rail(s) PT Goal: Up/Down Stairs - Progress: Other (comment)  PT Evaluation Precautions/Restrictions  Precautions Precautions: Back Required Braces or Orthoses: Yes Spinal Brace: Lumbar corset;Applied in sitting position;Other (comment) Jerrel Ivory) Spinal Brace Comments: pt needed VC's for donning the brace Prior Functioning  Home Living Lives With: Spouse;Son;Other (Comment) (x2) Receives Help From: Family Type of Home: House Home Layout: Two level;Able to live on main level with bedroom/bathroom Alternate Level Stairs-Rails: None Alternate Level Stairs-Number of Steps: 14 Home Access: Stairs to enter Entrance Stairs-Rails: None Entrance Stairs-Number of Steps: 3 Bathroom Toilet: Standard Home Adaptive Equipment: None;Crutches Prior Function Level of Independence: Independent with transfers (very little homemaking) Able to Take Stairs?: Yes Driving: Yes Cognition Cognition Arousal/Alertness: Awake/alert Overall Cognitive Status: Appears within functional limits for tasks assessed Orientation Level: Oriented X4 Sensation/Coordination Coordination Gross Motor Movements are Fluid and Coordinated: Yes Fine Motor Movements are Fluid and Coordinated: Yes Extremity Assessment RUE Assessment RUE Assessment: Within Functional Limits LUE Assessment LUE Assessment: Within Functional Limits RLE Assessment RLE Assessment: Within Functional Limits LLE Assessment LLE Assessment: Within Functional  Limits Mobility (including Balance) Bed Mobility Bed Mobility: Yes Rolling Left: 4: Min assist;With rail Rolling Left Details (indicate cue type and reason): vc for proper log rolling technique; manual A from trunk A Left Sidelying to Sit: 4: Min assist Left Sidelying to  Sit Details (indicate cue type and reason): vc's for hand placement and technique Sit to Supine - Right: Other (comment) (min guard A for sit to sidelying; vc for safe technique) Transfers Transfers: Yes Sit to Stand: Other (comment) (min guard A) Stand to Sit: To bed;Other (comment) (min guard A) Ambulation/Gait Ambulation/Gait: Yes Ambulation/Gait Assistance: Other (comment) (min guard A) Ambulation Distance (Feet): 150 Feet Assistive device: Rolling walker Gait Pattern: Within Functional Limits  Posture/Postural Control Posture/Postural Control: No significant limitations Exercise    End of Session PT - End of Session Activity Tolerance: Patient tolerated treatment well Patient left: in bed;with call bell in reach General Behavior During Session: [facility] for tasks performed Cognition: [place] for tasks performed  Morine Kohlman, Eliseo Gum 03/27/2011, 4:03 PM  03/27/2011  Odessa Bing, PT [phone] (pager)

## 2011-03-27 NOTE — Progress Notes (Signed)
Subjective: Patient reports Moderate back pain. Starting to ambulate independently. Independent activity Limited.  Objective: Vital signs in last 24 hours: Temp:  [97.4 F (36.3 C)-99.2 F (37.3 C)] 97.8 F (36.6 C) (12/08 1010) Pulse Rate:  [63-92] 63  (12/08 1010) Resp:  [10-22] 16  (12/08 1010) BP: (97-150)/(59-98) 103/64 mmHg (12/08 1010) SpO2:  [93 %-99 %] 94 % (12/08 1010) Weight:  [97.07 kg (214 lb)] 214 lb (97.07 kg) (12/07 1732)  Intake/Output from previous day: 12/07 0701 - 12/08 0700 In: 3240 [P.O.:240; I.V.:2600] Out: 1950 [Urine:1900; Blood:50] Intake/Output this shift: Total I/O In: 360 [P.O.:360] Out: -   Incisions clean and dry. No focal deficits and lower extremity motor strength.  Lab Results: No results found for this basename: WBC:2,HGB:2,HCT:2,PLT:2 in the last 72 hours BMET No results found for this basename: NA:2,K:2,CL:2,CO2:2,GLUCOSE:2,BUN:2,CREATININE:2,CALCIUM:2 in the last 72 hours  Studies/Results: Dg Lumbar Spine 2-3 Views  03/26/2011  *RADIOLOGY REPORT*  Clinical Data: L3-4 fusion.  LUMBAR SPINE - 2-3 VIEW  Comparison: 02/01/2011.  Findings: Two intraoperative C-arm views submitted for review after surgery.  This reveals placement of interbody spacer at the L3-4 level (to the right of midline) and left-sided pedicle screws and interconnecting bar.  IMPRESSION: Fusion L3-4.  Original Report Authenticated By: Fuller Canada, M.D.   Dg C-arm Gt 120 Min  03/26/2011  CLINICAL DATA: L3-4 XLIF w/perc screws   C-ARM GT 120 MIN  Fluoroscopy was utilized by the requesting physician.  No radiographic  interpretation.      Assessment/Plan: Stable day 1 status post L3-L4 decompression arthrodesis.  LOS: 1 day  Increase mobility and independence prior to discharge   Shreena Baines J 03/27/2011, 12:01 PM

## 2011-03-28 LAB — GLUCOSE, CAPILLARY
Glucose-Capillary: 116 mg/dL — ABNORMAL HIGH (ref 70–99)
Glucose-Capillary: 129 mg/dL — ABNORMAL HIGH (ref 70–99)
Glucose-Capillary: 132 mg/dL — ABNORMAL HIGH (ref 70–99)
Glucose-Capillary: 133 mg/dL — ABNORMAL HIGH (ref 70–99)
Glucose-Capillary: 141 mg/dL — ABNORMAL HIGH (ref 70–99)

## 2011-03-28 MED ORDER — POLYETHYLENE GLYCOL 3350 17 G PO PACK
17.0000 g | PACK | Freq: Every day | ORAL | Status: DC
  Administered 2011-03-28 – 2011-03-29 (×2): 17 g via ORAL
  Filled 2011-03-28 (×2): qty 1

## 2011-03-28 NOTE — Progress Notes (Signed)
Occupational Therapy Evaluation Patient Details Name: Vanessa Frank MRN: [id number] DOB: Feb 17, 1946 Today's Date: 03/28/2011  Problem List: There is no problem list on file for this patient.   Past Medical History:  Past Medical History  Diagnosis Date  . Hypertension   . Dysrhythmia IRREGULAR  RATE IN HER 20'S  . Shortness of breath WITH EXERTION  . Diabetes mellitus ORAL MEDS ONLY  . GERD (gastroesophageal reflux disease)   . Headache   . Neuromuscular disorder   . Arthritis   . Anxiety    Past Surgical History:  Past Surgical History  Procedure Date  . Abdominal hysterectomy 80'S  . Cholecystectomy 90'S  . Bartholin gland cyst excision 80'S  . Arthroscopy knee w/ drilling 09 LEFT   2012 RIGHT  . Cystoscopy 80'S    OT Assessment/Plan/Recommendation OT Assessment Clinical Impression Statement: Pt is a 65 year old woman recovering from back surgery.  Generally, pt requires min assist for mobility and min to mod assist for ADL.  Will follow acutely for ADL training.  No post hospitalization OT anticipated.  Pt will need a 3 in 1. OT Recommendation/Assessment: Patient will need skilled OT in the acute care venue OT Problem List: Impaired balance (sitting and/or standing);Decreased knowledge of use of DME or AE;Decreased knowledge of precautions;Pain;Obesity;Decreased activity tolerance OT Therapy Diagnosis : Generalized weakness;Acute pain OT Plan OT Frequency: Min 2X/week OT Treatment/Interventions: Self-care/ADL training;DME and/or AE instruction;Patient/family education OT Recommendation Follow Up Recommendations: None Equipment Recommended: 3 in 1 bedside comode;Rolling walker with 5" wheels Individuals Consulted Consulted and Agree with Results and Recommendations: Patient OT Goals Acute Rehab OT Goals OT Goal Formulation: With patient Time For Goal Achievement: 7 days ADL Goals Pt Will Perform Grooming: with modified independence;Standing at sink ADL Goal:  Grooming - Progress: Other (comment) Pt Will Perform Lower Body Bathing: with modified independence;Sitting, edge of bed;Sit to stand from bed;with adaptive equipment ADL Goal: Lower Body Bathing - Progress: Other (comment) Pt Will Perform Lower Body Dressing: with adaptive equipment;Sitting, bed;Sit to stand from bed;with modified independence ADL Goal: Lower Body Dressing - Progress: Other (comment) Pt Will Transfer to Toilet: with modified independence;3-in-1;Maintaining back safety precautions (3 in1 over toilet) ADL Goal: Toilet Transfer - Progress: Other (comment) Pt Will Perform Toileting - Hygiene: with modified independence;Sit to stand from 3-in-1/toilet (AE PRN) ADL Goal: Toileting - Hygiene - Progress: Other (comment) Pt Will Perform Tub/Shower Transfer: with supervision;Shower seat with back;Tub transfer;Maintaining back safety precautions ADL Goal: Tub/Shower Transfer - Progress: Other (comment) Miscellaneous OT Goals Miscellaneous OT Goal #1: Pt will perform bed mobility with mod I adhering to back precautions. OT Goal: Miscellaneous Goal #1 - Progress: Other (comment)  OT Evaluation Precautions/Restrictions  Precautions Precautions: Back;Fall Precaution Booklet Issued: Yes (comment) Precaution Comments: extensive education on back precautions Required Braces or Orthoses: Yes Spinal Brace: Lumbar corset;Applied in sitting position;Other (comment) Spinal Brace Comments: pt needed VC's for donning the brace Prior Functioning Home Living Lives With: Spouse;Son;Other (Comment) Receives Help From: Family Type of Home: House Home Layout: Two level;Able to live on main level with bedroom/bathroom Alternate Level Stairs-Rails: None Alternate Level Stairs-Number of Steps: 14 Home Access: Stairs to enter Entrance Stairs-Rails: None Entrance Stairs-Number of Steps: 3 Bathroom Shower/Tub: Engineer, manufacturing systems: Standard Home Adaptive Equipment: None;Crutches Prior  Function Level of Independence: Independent with transfers Able to Take Stairs?: Yes Driving: Yes ADL ADL Eating/Feeding: Simulated;Independent Where Assessed - Eating/Feeding: Bed level Grooming: Performed;Wash/dry face;Brushing hair;Set up Where Assessed - Grooming: Sitting, bed Upper  Body Bathing: Simulated;Minimal assistance Upper Body Bathing Details (indicate cue type and reason): instructed in use of long sponge for reaching back Where Assessed - Upper Body Bathing: Sitting, bed;Unsupported Lower Body Bathing: Simulated;Moderate assistance Lower Body Bathing Details (indicate cue type and reason): instructed in use and availability of AE for LB ADL Where Assessed - Lower Body Bathing: Sit to stand from bed;Sitting, bed Upper Body Dressing: Simulated;Set up Where Assessed - Upper Body Dressing: Sitting, bed Lower Body Dressing: Performed;Moderate assistance Lower Body Dressing Details (indicate cue type and reason): Instructed in use of AE for LB ADL.  Unable to cross foot over opposite knee. Where Assessed - Lower Body Dressing: Sit to stand from bed;Sitting, bed Toilet Transfer: Not assessed (pt declined) Toileting - Hygiene: Not assessed (instructed in back precautions and pericare-shown AE) Tub/Shower Transfer: Not assessed (pt has tub, discussed use of plastic chair in tub with super) Equipment Used: Rolling walker (back brace) Vision/Perception  Vision - History Baseline Vision: Wears glasses all the time Cognition Cognition Arousal/Alertness: Awake/alert Overall Cognitive Status: Appears within functional limits for tasks assessed Orientation Level: Oriented X4 Extremity Assessment RUE Assessment RUE Assessment: Within Functional Limits LUE Assessment LUE Assessment: Within Functional Limits Mobility  Bed Mobility Bed Mobility: Yes (min assist, rolling to R, R side to sit) Rolling Left: 4: Min assist;With rail Transfers Transfers: No (pt declined, took 2 steps  to [facility] only with min guard assist) Sit to Stand: 4: Min assist;From elevated surface;Without upper extremity assist;From bed Stand to Sit: To bed;5: Supervision;To elevated surface;With upper extremity assist End of Session OT - End of Session Equipment Utilized During Treatment: Back brace Activity Tolerance: Patient limited by pain Patient left: in bed;with call bell in reach;with bed alarm set General Behavior During Session: [facility] for tasks performed Cognition: [facility] for tasks performed   Evern Bio 03/28/2011, 10:11 AM [phone]

## 2011-03-28 NOTE — Progress Notes (Signed)
Patient ID: Vanessa Frank, female   DOB: Oct 05, 1945, 65 y.o.   MRN: [id number] Subjective: Patient reports Doing better. Still complains of some incisional pain. Also, constipation  Objective: Vital signs in last 24 hours: Temp:  [97.8 F (36.6 C)-98.5 F (36.9 C)] 98.2 F (36.8 C) (12/09 0865) Pulse Rate:  [63-87] 74  (12/09 0614) Resp:  [16-18] 18  (12/09 0614) BP: (103-117)/(62-81) 116/78 mmHg (12/09 0614) SpO2:  [93 %-99 %] 93 % (12/09 0614)  Intake/Output from previous day: 12/08 0701 - 12/09 0700 In: 1120 [P.O.:1120] Out: -  Intake/Output this shift:    No issues  Lab Results: No results found for this basename: WBC:2,HGB:2,HCT:2,PLT:2 in the last 72 hours BMET No results found for this basename: NA:2,K:2,CL:2,CO2:2,GLUCOSE:2,BUN:2,CREATININE:2,CALCIUM:2 in the last 72 hours  Studies/Results: Dg Lumbar Spine 2-3 Views  03/26/2011  *RADIOLOGY REPORT*  Clinical Data: L3-4 fusion.  LUMBAR SPINE - 2-3 VIEW  Comparison: 02/01/2011.  Findings: Two intraoperative C-arm views submitted for review after surgery.  This reveals placement of interbody spacer at the L3-4 level (to the right of midline) and left-sided pedicle screws and interconnecting bar.  IMPRESSION: Fusion L3-4.  Original Report Authenticated By: Fuller Canada, M.D.   Dg C-arm Gt 120 Min  03/26/2011  CLINICAL DATA: L3-4 XLIF w/perc screws   C-ARM GT 120 MIN  Fluoroscopy was utilized by the requesting physician.  No radiographic  interpretation.      Assessment/Plan: Doing well.   LOS: 2 days  laxatives   Reinaldo Meeker, MD 03/28/2011, 8:48 AM

## 2011-03-28 NOTE — Progress Notes (Signed)
Physical Therapy Treatment Patient Details Name: Vanessa Frank MRN: [id number] DOB: [birth year] Today's Date: 03/28/2011  Pt politely refusing today due to was in pain after OT eval and now too tired from muscle relaxer.    Sallyanne Kuster 03/28/2011, 1:29 PM  Sallyanne Kuster, PTA Office- [phone]

## 2011-03-29 LAB — GLUCOSE, CAPILLARY
Glucose-Capillary: 125 mg/dL — ABNORMAL HIGH (ref 70–99)
Glucose-Capillary: 121 mg/dL — ABNORMAL HIGH (ref 70–99)

## 2011-03-29 MED ORDER — OXYCODONE-ACETAMINOPHEN 5-325 MG PO TABS: 1.0000 | ORAL_TABLET | ORAL | Status: AC | PRN

## 2011-03-29 MED ORDER — METHOCARBAMOL 500 MG PO TABS: 500.0000 mg | ORAL_TABLET | Freq: Four times a day (QID) | ORAL | Status: AC | PRN

## 2011-03-29 NOTE — Progress Notes (Signed)
Physical Therapy Treatment Patient Details Name: Vanessa Frank MRN: [id number] DOB: 1945-10-02 Today's Date: 03/29/2011  PT Assessment/Plan  PT - Assessment/Plan Comments on Treatment Session: Pt initially deferring PT session & stating that she didnt feel like she needed to work on anything, but then agreeable to encouragement to perform stairs to ensure safety prior to D/C home.   PT Plan: Discharge plan remains appropriate PT Frequency: Min 5X/week Follow Up Recommendations: Home health PT Equipment Recommended: 3 in 1 bedside comode;Rolling walker with 5" wheels PT Goals  Acute Rehab PT Goals PT Goal: Supine/Side to Sit - Progress: Progressing toward goal PT Goal: Sit to Supine/Side - Progress: Progressing toward goal PT Goal: Sit to Stand - Progress: Progressing toward goal PT Goal: Ambulate - Progress: Progressing toward goal PT Goal: Up/Down Stairs - Progress: Progressing toward goal  PT Treatment Precautions/Restrictions  Precautions Precautions: Back Precaution Booklet Issued: Yes (comment) Precaution Comments: independently verbalized 3/3 back precautions but required ocassional cueing throughout to reinforce precautions.   Required Braces or Orthoses: Yes Spinal Brace: Lumbar corset;Applied in sitting position Spinal Brace Comments: Can be applied in sitting Restrictions Weight Bearing Restrictions: No Mobility (including Balance) Bed Mobility Rolling Left: 5: Supervision Rolling Left Details (indicate cue type and reason): (S) to reinforce precautions.   Right Sidelying to Sit: 5: Supervision;HOB flat Sit to Supine - Right: 4: Min assist Sit to Supine - Right Details (indicate cue type and reason): (A) to lift LE's onto bed.  Cues to reinforce technique & back precautions.   Transfers Sit to Stand: 6: Modified independent (Device/Increase time);From bed;With upper extremity assist Stand to Sit: 6: Modified independent (Device/Increase time);With upper extremity  assist;To bed Ambulation/Gait Ambulation/Gait Assistance: 5: Supervision Ambulation/Gait Assistance Details (indicate cue type and reason): (S) for safety.  Pt c/o of increased difficulty picking RLE up with increased distance.   Ambulation Distance (Feet): 200 Feet Assistive device: Rolling walker Gait Pattern: Antalgic Stairs: Yes Stairs Assistance: 4: Min assist Stairs Assistance Details (indicate cue type and reason): (A) for balance & safety.  unilateral HHA with ascending & then pt placed UE's on this clinicians' shoulders for UE support when descending steps.   Stair Management Technique: Step to pattern;Forwards;No rails Number of Stairs: 3  (2x's.  )    Exercise    End of Session PT - End of Session Equipment Utilized During Treatment: Gait belt;Back brace Activity Tolerance: Patient tolerated treatment well Patient left: in bed;with call bell in reach General Behavior During Session: [facility] for tasks performed Cognition: [facility] for tasks performed  Lara Mulch 03/29/2011, 2:20 PM [phone]

## 2011-03-29 NOTE — Progress Notes (Signed)
Occupational Therapy Evaluation Patient Details Name: Vanessa Frank MRN: [id number] DOB: [birth year] Today's Date: 03/29/2011 8:42-9:15  2sc OT Assessment/Plan OT Assessment/Plan Comments on Treatment Session: Pt doing well overall setup to modified independent level for all selfcare, toileting with appropriate DME/AE.  Will have PRN supervision from husband and son at d/c.  Plans on purchasing AE prior to d/c.  Will need 3:1 for use over the toilet at home. OT Plan: Discharge plan remains appropriate OT Frequency: Min 2X/week Follow Up Recommendations: None Equipment Recommended: 3 in 1 bedside comode;Rolling walker with 5" wheels OT Goals ADL Goals ADL Goal: Grooming - Progress: Progressing toward goals ADL Goal: Lower Body Bathing - Progress: Progressing toward goals ADL Goal: Lower Body Dressing - Progress: Progressing toward goals ADL Goal: Toilet Transfer - Progress: Met ADL Goal: Toileting - Hygiene - Progress: Met ADL Goal: Tub/Shower Transfer - Progress: Met Miscellaneous OT Goals OT Goal: Miscellaneous Goal #1 - Progress: Progressing toward goals  OT Treatment Precautions/Restrictions  Precautions Precautions: Back Required Braces or Orthoses: Yes Spinal Brace: Lumbar corset Spinal Brace Comments: Can be applied in sitting Restrictions Weight Bearing Restrictions: No   ADL ADL Eating/Feeding: Not assessed Grooming: Not assessed Upper Body Bathing: Not assessed Lower Body Bathing: Not assessed Upper Body Dressing: Performed;Set up Where Assessed - Upper Body Dressing: Sitting, bed (Pt able to donn lumbar corsett.) Lower Body Dressing: Performed;Set up Lower Body Dressing Details (indicate cue type and reason): Pt performed with AE (reacher and sockaide) Where Assessed - Lower Body Dressing: Sit to stand from bed Toilet Transfer: Simulated;Modified independent Toilet Transfer Method: Proofreader: Raised toilet seat with arms (or 3-in-1  over toilet) Toileting - Clothing Manipulation: Simulated;Modified independent Where Assessed - Toileting Clothing Manipulation: Sit to stand from 3-in-1 or toilet Toileting - Hygiene: Simulated;Modified independent Where Assessed - Toileting Hygiene: Sit to stand from 3-in-1 or toilet Tub/Shower Transfer: Performed;Supervision/safety Tub/Shower Transfer Details (indicate cue type and reason): Pt able to step over edge of tub using wall for balance.  Re-emphasized the need for a plastic chair. Tub/Shower Transfer Method: Ambulating Equipment Used: Rolling walker ADL Comments: Pt doing great, will need AE to help with LB self care. Mobility  Bed Mobility Bed Mobility: Yes Rolling Left: 5: Supervision Right Sidelying to Sit: 5: Supervision;HOB flat Transfers Sit to Stand: 6: Modified independent (Device/Increase time);With upper extremity assist;From bed Exercises    End of Session OT - End of Session Equipment Utilized During Treatment: Other (comment) (Rolling Walker) Activity Tolerance: Patient tolerated treatment well Patient left: in chair;with call bell in reach General Behavior During Session: [facility] for tasks performed Cognition: [facility] for tasks performed  Eulalah Rupert OTR/L 03/29/2011, 9:53 AM Pager number [phone]

## 2011-03-29 NOTE — Progress Notes (Signed)
Discharge planning. Spoke with patient rolling walker and 3in1 ordered.

## 2011-03-29 NOTE — Discharge Summary (Signed)
Physician Discharge Summary  Patient ID: Vanessa Frank MRN: [id number] DOB/AGE: 1946/01/18 65 y.o.  Admit date: 03/26/2011 Discharge date: 03/29/2011  Admission Diagnoses: DDD L3-4    Discharge Diagnoses: same   Discharged Condition: good  Hospital Course: The patient was admitted on 03/26/2011 and taken to the operating room where the patient underwent Fusion L3-4. The patient tolerated the procedure well and was taken to the recovery room and then to the floor in stable condition. The hospital course was routine. There were no complications. The wound remained clean dry and intact. The patient remained afebrile with stable vital signs, and tolerated a regular diet. The patient continued to increase activities, and pain was well controlled with oral pain medications.   Consults: none  Significant Diagnostic Studies:  Results for orders placed during the hospital encounter of 03/26/11  GLUCOSE, CAPILLARY      Component Value Range   Glucose-Capillary 139 (*) 70 - 99 (mg/dL)  GLUCOSE, CAPILLARY      Component Value Range   Glucose-Capillary 174 (*) 70 - 99 (mg/dL)   Comment 1 Documented in Chart     Comment 2 Notify RN    GLUCOSE, CAPILLARY      Component Value Range   Glucose-Capillary 190 (*) 70 - 99 (mg/dL)   Comment 1 Documented in Chart     Comment 2 Notify RN    GLUCOSE, CAPILLARY      Component Value Range   Glucose-Capillary 156 (*) 70 - 99 (mg/dL)  GLUCOSE, CAPILLARY      Component Value Range   Glucose-Capillary 148 (*) 70 - 99 (mg/dL)  GLUCOSE, CAPILLARY      Component Value Range   Glucose-Capillary 137 (*) 70 - 99 (mg/dL)  GLUCOSE, CAPILLARY      Component Value Range   Glucose-Capillary 129 (*) 70 - 99 (mg/dL)   Comment 1 Documented in Chart     Comment 2 Notify RN    GLUCOSE, CAPILLARY      Component Value Range   Glucose-Capillary 133 (*) 70 - 99 (mg/dL)   Comment 1 Documented in Chart     Comment 2 Notify RN    GLUCOSE, CAPILLARY      Component  Value Range   Glucose-Capillary 141 (*) 70 - 99 (mg/dL)  GLUCOSE, CAPILLARY      Component Value Range   Glucose-Capillary 132 (*) 70 - 99 (mg/dL)  GLUCOSE, CAPILLARY      Component Value Range   Glucose-Capillary 116 (*) 70 - 99 (mg/dL)   Comment 1 Documented in Chart     Comment 2 Notify RN    GLUCOSE, CAPILLARY      Component Value Range   Glucose-Capillary 125 (*) 70 - 99 (mg/dL)   Comment 1 Documented in Chart     Comment 2 Notify RN      Chest 2 View  03/17/2011  *RADIOLOGY REPORT*  Clinical Data: Preop for lumbar fusion.  CHEST - 2 VIEW  Comparison: None  Findings: The cardiac silhouette, mediastinal and hilar contours are within normal limits for age.  There is mild tortuosity and calcification of the thoracic aorta.  The lungs are clear acute process.  Small calcified granulomas are noted.  Minimal basilar scarring or atelectasis.  No effusion.  The bony thorax is intact.  IMPRESSION: Mild chronic bronchitic type lung changes and small calcified granulomas. No acute pulmonary findings.  Original Report Authenticated By: P. Loralie Champagne, M.D.   Dg Lumbar Spine 2-3 Views  03/26/2011  *  RADIOLOGY REPORT*  Clinical Data: L3-4 fusion.  LUMBAR SPINE - 2-3 VIEW  Comparison: 02/01/2011.  Findings: Two intraoperative C-arm views submitted for review after surgery.  This reveals placement of interbody spacer at the L3-4 level (to the right of midline) and left-sided pedicle screws and interconnecting bar.  IMPRESSION: Fusion L3-4.  Original Report Authenticated By: Fuller Canada, M.D.   Dg C-arm Gt 120 Min  03/26/2011  CLINICAL DATA: L3-4 XLIF w/perc screws   C-ARM GT 120 MIN  Fluoroscopy was utilized by the requesting physician.  No radiographic  interpretation.      Antibiotics:  Anti-infectives     Start     Dose/Rate Route Frequency Ordered Stop   03/26/11 1800   ceFAZolin (ANCEF) IVPB 1 g/50 mL premix        1 g 100 mL/hr over 30 Minutes Intravenous 3 times per day 03/26/11  1701 03/27/11 0558   03/26/11 0906   bacitracin 50,000 Units in sodium chloride irrigation 0.9 % 500 mL irrigation  Status:  Discontinued          As needed 03/26/11 0906 03/26/11 1103   03/26/11 0615   ceFAZolin (ANCEF) IVPB 2 g/50 mL premix        2 g 100 mL/hr over 30 Minutes Intravenous  Once 03/26/11 0605 03/26/11 0752   03/26/11 0607   ceFAZolin (ANCEF) 2-3 GM-% IVPB SOLR     Comments: SAVAGE, MICHELLE: cabinet override         03/26/11 0607 03/26/11 1814          Discharge Exam: Blood pressure 128/70, pulse 83, temperature 98.4 F (36.9 C), temperature source Oral, resp. rate 20, height 5\' 3"  (1.6 m), weight 97.07 kg (214 lb), SpO2 98.00%. Neurologic: Grossly normal  Discharge Medications:   Current Discharge Medication List    CONTINUE these medications which have NOT CHANGED   Details  atorvastatin (LIPITOR) 10 MG tablet Take 10 mg by mouth daily.      B Complex Vitamins (VITAMIN B COMPLEX IJ) Inject 1 mL as directed every 30 (thirty) days.      Calcium Carbonate-Vitamin D (CALCIUM + D PO) Take 1 tablet by mouth daily.      furosemide (LASIX) 40 MG tablet Take 40 mg by mouth daily.      hydrocodone-acetaminophen (LORCET-HD) 5-500 MG per capsule Take 1 capsule by mouth 2 (two) times daily.      lisinopril (PRINIVIL,ZESTRIL) 10 MG tablet Take 10 mg by mouth daily.      meloxicam (MOBIC) 7.5 MG tablet Take 7.5 mg by mouth daily.      metFORMIN (GLUCOPHAGE) 1000 MG tablet Take 1,000 mg by mouth 2 (two) times daily with a meal.      metoCLOPramide (REGLAN) 10 MG tablet Take 10 mg by mouth 2 (two) times daily.      Multiple Vitamins-Minerals (MULTIVITAMINS THER. W/MINERALS) TABS Take 1 tablet by mouth daily.      omeprazole (PRILOSEC) 20 MG capsule Take 20 mg by mouth 2 (two) times daily.      pioglitazone (ACTOS) 30 MG tablet Take 30 mg by mouth daily.      sertraline (ZOLOFT) 100 MG tablet Take 100 mg by mouth daily.          Disposition: home   Final  Dx: Fusion L3-4    Follow-up Information    Follow up with Alixander Rallis S in 2 weeks.   Contact information:   [address]  [place] [address] [phone]           Signed: Tia Alert 03/29/2011, 10:06 AM

## 2011-03-30 ENCOUNTER — Encounter (HOSPITAL_COMMUNITY): Payer: Self-pay | Admitting: Neurological Surgery

## 2011-04-06 ENCOUNTER — Other Ambulatory Visit: Payer: Self-pay | Admitting: Neurological Surgery

## 2011-04-06 ENCOUNTER — Ambulatory Visit
Admission: RE | Admit: 2011-04-06 | Discharge: 2011-04-06 | Disposition: A | Discharge status: Home / Self Care | Payer: Medicare Other | Source: Ambulatory Visit | Attending: Neurological Surgery | Admitting: Neurological Surgery

## 2011-04-06 DIAGNOSIS — M545 Low back pain, unspecified: Secondary | ICD-10-CM

## 2011-04-06 DIAGNOSIS — M79609 Pain in unspecified limb: Secondary | ICD-10-CM

## 2011-04-06 DIAGNOSIS — M5137 Other intervertebral disc degeneration, lumbosacral region: Secondary | ICD-10-CM

## 2011-05-11 ENCOUNTER — Other Ambulatory Visit: Payer: Self-pay | Admitting: Neurological Surgery

## 2011-05-11 DIAGNOSIS — M545 Low back pain, unspecified: Secondary | ICD-10-CM

## 2011-05-24 ENCOUNTER — Ambulatory Visit
Admission: RE | Admit: 2011-05-24 | Discharge: 2011-05-24 | Disposition: A | Discharge status: Home / Self Care | Payer: Medicare Other | Source: Ambulatory Visit | Attending: Neurological Surgery | Admitting: Neurological Surgery

## 2011-05-24 DIAGNOSIS — M545 Low back pain, unspecified: Secondary | ICD-10-CM

## 2012-01-25 ENCOUNTER — Other Ambulatory Visit: Payer: Self-pay | Admitting: Neurological Surgery

## 2012-01-25 DIAGNOSIS — M545 Low back pain, unspecified: Secondary | ICD-10-CM

## 2012-02-02 ENCOUNTER — Ambulatory Visit
Admission: RE | Admit: 2012-02-02 | Discharge: 2012-02-02 | Disposition: A | Discharge status: Home / Self Care | Payer: Medicare Other | Source: Ambulatory Visit | Attending: Neurological Surgery | Admitting: Neurological Surgery

## 2012-02-02 VITALS — BP 135/59 | HR 64

## 2012-02-02 DIAGNOSIS — M545 Low back pain, unspecified: Secondary | ICD-10-CM

## 2012-02-02 MED ORDER — IOHEXOL 180 MG/ML  SOLN
15.0000 mL | Freq: Once | INTRAMUSCULAR | Status: AC | PRN
  Administered 2012-02-02: 15 mL via INTRATHECAL

## 2012-02-02 MED ORDER — DIAZEPAM 5 MG PO TABS
5.0000 mg | ORAL_TABLET | Freq: Once | ORAL | Status: AC
  Administered 2012-02-02: 5 mg via ORAL

## 2012-02-02 MED ORDER — ONDANSETRON HCL 4 MG/2ML IJ SOLN: 4.0000 mg | Freq: Four times a day (QID) | INTRAMUSCULAR | Status: DC | PRN

## 2012-02-22 IMAGING — CT CT L SPINE W/O CM
3 of 10 series · 5 of 20 positions shown, 6 images · non-contrast
Comparison: MRI lumbar spine 02/01/2011.  Postoperative films
04/06/2011.

CLINICAL DATA: Low back and right leg pain.  Previous fusion
03/26/2011.

CT LUMBAR SPINE WITHOUT CONTRAST
TECHNIQUE: Multidetector CT imaging of the lumbar spine was
performed without intravenous contrast administration. Multiplanar
CT image reconstructions were also generated.

[Series 3: l spine bone · axial · 0.27mm/px · z∈[-88,-18]mm · 2 of 84 slices shown, 3 images]
[im 28/84  soft-tissue]
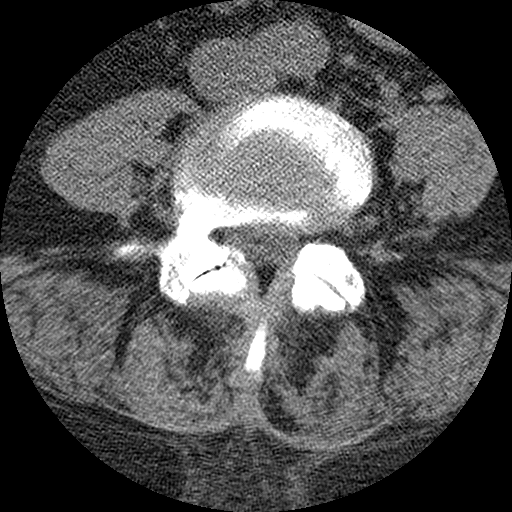
[im 28/84  bone]
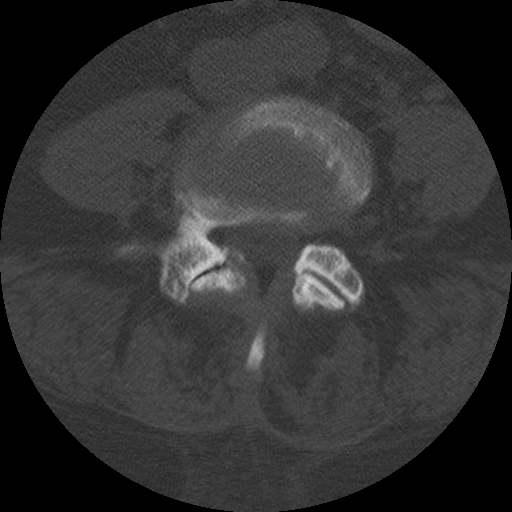
[im 56/84  bone]
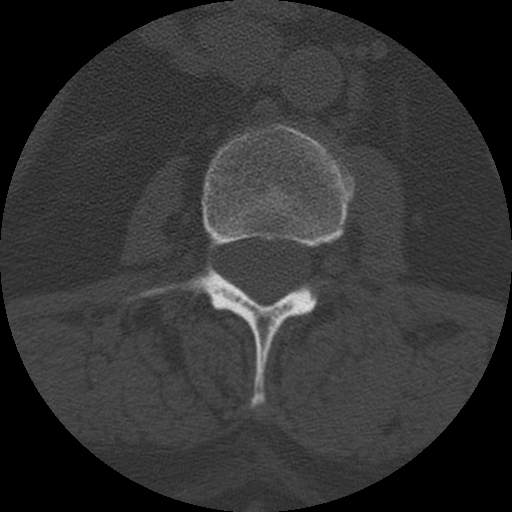

[Series 4: l spine soft · axial · 0.27mm/px · z∈[-88,-18]mm · 2 of 84 slices shown]
[im 28/84  soft-tissue]
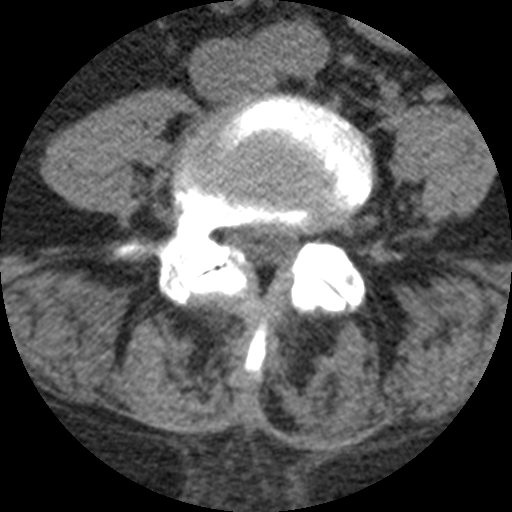
[im 56/84  soft-tissue]
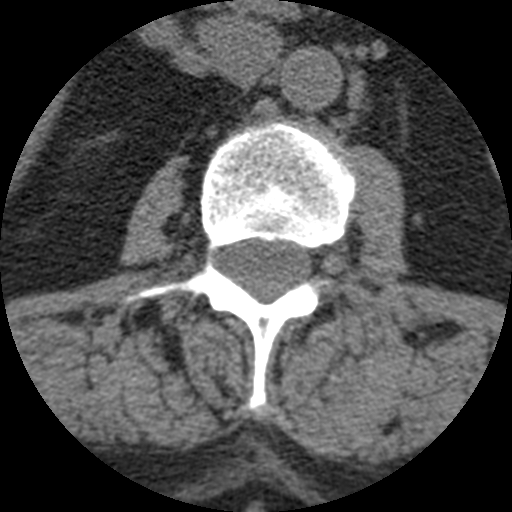

[Series 200: sagittal · sagittal · 0.42mm/px · 1 of 48 slices shown]
[im 24/48  bone]
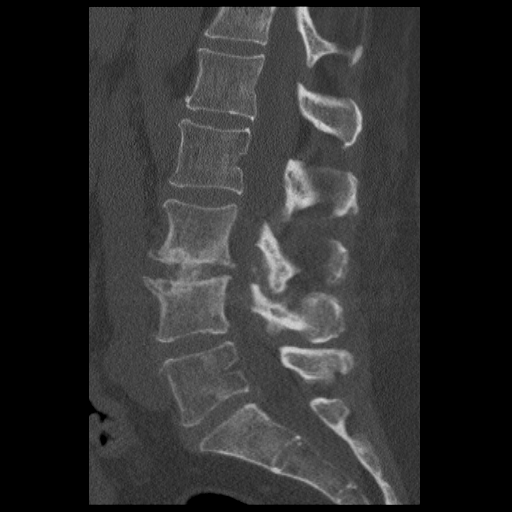

[5 of 20 positions shown; findings below may reference images not displayed]

FINDINGS: Patient is status post L3-4 XLIF with unilateral left L3
and L4 percutaneous pedicle screw fixation.  The interbody cage is
located within the interspace.  There appears to be early
incorporation into the endplates above and below L3-4 on the right.
Pedicle screws are appropriately located without loosening or
fracture.  The alignment is anatomic.  Large buttressing
osteophytes project to the right  similar to the preoperative plain
film appearance.  Overall marked improvement in interspace height
at L3-4 on the right.

Unchanged and L1-2 and L2-3 disc spaces, with a large
extraforaminal spur and far lateral protrusion on the right at L1-
L2.  At L4-5 there is mild annular bulging with marked facet
arthropathy similar to priors.  At L5-S1 there is a shallow central
protrusion with left greater than right facet arthropathy
redemonstrated.

Compared with the preoperative plain films, there is slight
improvement in the levoscoliosis centered at L3-L4.
IMPRESSION: Satisfactory appearance status post L3-4 XLIF.  Early fusion
appears to be occurring on the right.  Pedicle screws are
appropriately placed without loosening or fracture. Unchanged
remaining lumbar levels.

With regard to the the patient's right leg pain, it is unclear if
disc pathology at L1-2 could be contributing.  Selective nerve root
block might help differentiate.

## 2013-05-14 NOTE — Progress Notes (Signed)
Dr. Lequita HaltAluisio could you [facility] patient has a pre-op appointment on 05/24/2013 at 2 pm! Thank you!

## 2013-05-15 ENCOUNTER — Other Ambulatory Visit: Payer: Self-pay | Admitting: Orthopedic Surgery

## 2013-05-17 ENCOUNTER — Ambulatory Visit: Payer: Medicare Other | Attending: Specialist | Admitting: Physical Therapy

## 2013-05-17 DIAGNOSIS — M25519 Pain in unspecified shoulder: Secondary | ICD-10-CM | POA: Insufficient documentation

## 2013-05-17 DIAGNOSIS — IMO0001 Reserved for inherently not codable concepts without codable children: Secondary | ICD-10-CM | POA: Insufficient documentation

## 2013-05-17 DIAGNOSIS — I10 Essential (primary) hypertension: Secondary | ICD-10-CM | POA: Insufficient documentation

## 2013-05-17 DIAGNOSIS — M25619 Stiffness of unspecified shoulder, not elsewhere classified: Secondary | ICD-10-CM | POA: Insufficient documentation

## 2013-05-17 DIAGNOSIS — E119 Type 2 diabetes mellitus without complications: Secondary | ICD-10-CM | POA: Insufficient documentation

## 2013-05-22 ENCOUNTER — Encounter (HOSPITAL_COMMUNITY): Payer: Self-pay | Admitting: Pharmacy Technician

## 2013-05-23 NOTE — Patient Instructions (Addendum)
Vanessa HanlonLinda R Frank  05/23/2013                           YOUR PROCEDURE IS SCHEDULED ON: 05/28/13               PLEASE REPORT TO SHORT STAY CENTER AT : 5:00 AM               CALL THIS NUMBER IF ANY PROBLEMS THE DAY OF SURGERY :               [phone]                  REMEMBER:   Do not eat food or drink liquids AFTER MIDNIGHT       Take these medicines the morning of surgery with A SIP OF WATER: NEXIUM / METOCLOPRAMIDE / SERTRALINE / MAY TAKE PERCOCET IF NEEDED   Do not wear jewelry, make-up   Do not wear lotions, powders, or perfumes.   Do not shave legs or underarms 12 hrs. before surgery (men may shave face)  Do not bring valuables to the hospital.  Contacts, dentures or bridgework may not be worn into surgery.  Leave suitcase in the car. After surgery it may be brought to your room.  For patients admitted to the hospital more than one night, checkout time is                        11:00 AM                       The day of discharge.   Patients discharged the day of surgery will not be allowed to drive home.              If going home same day of surgery, must have someone stay with you first              24 hrs at home and arrange for some one to drive you home from hospital.    Special Instructions:   Please read over the following fact sheets that you were given:               1. [place] PREPARING FOR SURGERY SHEET                2. MRSA INFORMATION SHEET                3. INCENTIVE SPIROMETER                                                X_____________________________________________________________________        Failure to follow these instructions may result in cancellation of your surgery

## 2013-05-24 ENCOUNTER — Ambulatory Visit: Payer: Medicare Other | Attending: Specialist | Admitting: Physical Therapy

## 2013-05-24 ENCOUNTER — Encounter (HOSPITAL_COMMUNITY)
Admission: RE | Admit: 2013-05-24 | Discharge: 2013-05-24 | Disposition: A | Discharge status: Home / Self Care | Payer: Medicare Other | Source: Ambulatory Visit | Attending: Orthopedic Surgery | Admitting: Orthopedic Surgery

## 2013-05-24 ENCOUNTER — Encounter (HOSPITAL_COMMUNITY): Payer: Self-pay

## 2013-05-24 ENCOUNTER — Ambulatory Visit (HOSPITAL_COMMUNITY)
Admission: RE | Admit: 2013-05-24 | Discharge: 2013-05-24 | Disposition: A | Discharge status: Home / Self Care | Payer: Medicare Other | Source: Ambulatory Visit | Attending: Orthopedic Surgery | Admitting: Orthopedic Surgery

## 2013-05-24 ENCOUNTER — Encounter (INDEPENDENT_AMBULATORY_CARE_PROVIDER_SITE_OTHER): Payer: Self-pay

## 2013-05-24 DIAGNOSIS — Z01812 Encounter for preprocedural laboratory examination: Secondary | ICD-10-CM | POA: Insufficient documentation

## 2013-05-24 DIAGNOSIS — Z01810 Encounter for preprocedural cardiovascular examination: Secondary | ICD-10-CM | POA: Insufficient documentation

## 2013-05-24 DIAGNOSIS — I499 Cardiac arrhythmia, unspecified: Secondary | ICD-10-CM | POA: Insufficient documentation

## 2013-05-24 DIAGNOSIS — I10 Essential (primary) hypertension: Secondary | ICD-10-CM | POA: Insufficient documentation

## 2013-05-24 DIAGNOSIS — R0989 Other specified symptoms and signs involving the circulatory and respiratory systems: Secondary | ICD-10-CM | POA: Insufficient documentation

## 2013-05-24 DIAGNOSIS — M25619 Stiffness of unspecified shoulder, not elsewhere classified: Secondary | ICD-10-CM | POA: Insufficient documentation

## 2013-05-24 DIAGNOSIS — IMO0001 Reserved for inherently not codable concepts without codable children: Secondary | ICD-10-CM | POA: Insufficient documentation

## 2013-05-24 DIAGNOSIS — M25519 Pain in unspecified shoulder: Secondary | ICD-10-CM | POA: Insufficient documentation

## 2013-05-24 DIAGNOSIS — Z01818 Encounter for other preprocedural examination: Secondary | ICD-10-CM | POA: Insufficient documentation

## 2013-05-24 DIAGNOSIS — E119 Type 2 diabetes mellitus without complications: Secondary | ICD-10-CM | POA: Insufficient documentation

## 2013-05-24 DIAGNOSIS — R911 Solitary pulmonary nodule: Secondary | ICD-10-CM | POA: Insufficient documentation

## 2013-05-24 DIAGNOSIS — R0609 Other forms of dyspnea: Secondary | ICD-10-CM | POA: Insufficient documentation

## 2013-05-24 DIAGNOSIS — M1710 Unilateral primary osteoarthritis, unspecified knee: Secondary | ICD-10-CM | POA: Insufficient documentation

## 2013-05-24 HISTORY — DX: Fibromyalgia: M79.7

## 2013-05-24 HISTORY — DX: Stress incontinence (female) (male): N39.3

## 2013-05-24 HISTORY — DX: Polyneuropathy, unspecified: G62.9

## 2013-05-24 LAB — APTT: aPTT: 27 seconds (ref 24–37)

## 2013-05-24 LAB — CBC
HCT: 35.9 % — ABNORMAL LOW (ref 36.0–46.0)
Hemoglobin: 12.1 g/dL (ref 12.0–15.0)
MCH: 29.6 pg (ref 26.0–34.0)
MCHC: 33.7 g/dL (ref 30.0–36.0)
MCV: 87.8 fL (ref 78.0–100.0)
Platelets: 410 10*3/uL — ABNORMAL HIGH (ref 150–400)
RBC: 4.09 MIL/uL (ref 3.87–5.11)
RDW: 13.6 % (ref 11.5–15.5)
WBC: 7.7 10*3/uL (ref 4.0–10.5)

## 2013-05-24 LAB — URINALYSIS, ROUTINE W REFLEX MICROSCOPIC
Bilirubin Urine: NEGATIVE
Glucose, UA: NEGATIVE mg/dL
Hgb urine dipstick: NEGATIVE
Ketones, ur: NEGATIVE mg/dL
Nitrite: NEGATIVE
Protein, ur: NEGATIVE mg/dL
Specific Gravity, Urine: 1.008 (ref 1.005–1.030)
Urobilinogen, UA: 0.2 mg/dL (ref 0.0–1.0)
pH: 5 (ref 5.0–8.0)

## 2013-05-24 LAB — URINE MICROSCOPIC-ADD ON

## 2013-05-24 LAB — SURGICAL PCR SCREEN
MRSA, PCR: NEGATIVE
Staphylococcus aureus: NEGATIVE

## 2013-05-24 LAB — COMPREHENSIVE METABOLIC PANEL
ALT: 12 U/L (ref 0–35)
AST: 11 U/L (ref 0–37)
Albumin: 4.3 g/dL (ref 3.5–5.2)
Alkaline Phosphatase: 100 U/L (ref 39–117)
BUN: 19 mg/dL (ref 6–23)
CO2: 27 mEq/L (ref 19–32)
Calcium: 10 mg/dL (ref 8.4–10.5)
Chloride: 94 mEq/L — ABNORMAL LOW (ref 96–112)
Creatinine, Ser: 1.17 mg/dL — ABNORMAL HIGH (ref 0.50–1.10)
GFR calc Af Amer: 54 mL/min — ABNORMAL LOW (ref >90)
GFR calc non Af Amer: 46 mL/min — ABNORMAL LOW (ref >90)
Glucose, Bld: 113 mg/dL — ABNORMAL HIGH (ref 70–99)
Potassium: 4.3 mEq/L (ref 3.7–5.3)
Sodium: 136 mEq/L — ABNORMAL LOW (ref 137–147)
Total Bilirubin: 0.3 mg/dL (ref 0.3–1.2)
Total Protein: 7.8 g/dL (ref 6.0–8.3)

## 2013-05-24 LAB — PROTIME-INR
INR: 0.88 (ref 0.00–1.49)
Prothrombin Time: 11.8 seconds (ref 11.6–15.2)

## 2013-05-24 LAB — ABO/RH: ABO/RH(D): O NEG

## 2013-05-25 NOTE — Progress Notes (Signed)
05/24/13 1424  OBSTRUCTIVE SLEEP APNEA  Have you ever been diagnosed with sleep apnea through a sleep study? No  Do you snore loudly (loud enough to be heard through closed doors)?  1  Do you often feel tired, fatigued, or sleepy during the daytime? 1  Has anyone observed you stop breathing during your sleep? 0  Do you have, or are you being treated for high blood pressure? 1  BMI more than 35 kg/m2? 1  Age over 70 years old? 1  Neck circumference greater than 40 cm/18 inches? 0  Gender: 0  Obstructive Sleep Apnea Score 5  Score 4 or greater  Results sent to PCP

## 2013-05-27 ENCOUNTER — Other Ambulatory Visit: Payer: Self-pay | Admitting: Orthopedic Surgery

## 2013-05-27 NOTE — H&P (Signed)
Vanessa Frank. Kocsis  DOB: July 31, 1945 Married / Language: English / Race: White Female  Date of Admission:  05-28-2913  Chief Complaint:  Left knee pain  History of Present Illness The patient is a 68 year old female who comes in for a preoperative History and Physical. The patient is scheduled for a left total knee arthroplasty to be performed by Dr. Gus Rankin. Aluisio, MD at [facility] on 05-28-2013. The patient is a 68 year old female who presents with knee complaints. The patient is seen in referral from Dr. Yetta Barre. The patient reports left knee and right knee symptoms including: pain, instability, giving way (feels like they are going to) and (popping) which began year(s) ago without any known injury. Prior to being seen today the patient was previously evaluated by a colleague (Dr. Yetta Barre) 2 month(s) ago (or 3). Note for "Knee pain": She has been seeing Dr. Yetta Barre for her back and has had recent ESIs by Dr. Murray Hodgkins. She said she has been seen by someone else for her knees, since she was here last. She had a series of Synvisc, which was completed by our PA, Leilani Able on 06/17/11. She has not been back since she had the third injection. She feels as though the knees have gotten a lot worse since she was seen a year and a half ago. The left knee is more symptomatic than the right but both are very problematic for her. They hurt at all times. They are limiting what she can and can not do. She is not really having pain at night.She is ready to proceed with knee replacement. They have been treated conservatively in the past for the above stated problem and despite conservative measures, they continue to have progressive pain and severe functional limitations and dysfunction. They have failed non-operative management including home exercise, medications, and injections. It is felt that they would benefit from undergoing total joint replacement. Risks and benefits of the procedure have been  discussed with the patient and they elect to proceed with surgery. There are no active contraindications to surgery such as ongoing infection or rapidly progressive neurological disease.  Allergies No Known Drug Allergies. 06/01/2011 sensitivity to codeine - Patient IS able to take oxycodone and hydrocodone  Problem List/Past MedicalHistory Osteoarthritis, Knee (715.96) Adhesive capsulitis (726.0) Right shoulder pain (719.41) Osteoarthritis High blood pressure Anxiety Disorder Chronic Pain Fibromyalgia Diabetes Mellitus, Type II Impaired Hearing Shingles Depression Varicose veins Gastroesophageal Reflux Disease Hemorrhoids Urinary Incontinence Rheumatoid Arthritis Osteoporosis Degenerative Disc Disease Menopause   Family History Depression. mother and grandmother mothers side mother, sister and grandmother mothers side Cancer. First Degree Relatives. mother and father Congestive Heart Failure. First Degree Relatives. mother Severe allergy. mother Heart Disease. mother Drug / Alcohol Addiction. mother Hypertension. mother Rheumatoid Arthritis. grandmother fathers side Osteoarthritis. mother, sister and grandmother mothers side mother, grandmother mothers side and grandmother fathers side Father. Deceased, Cancer. age 76 Mother. Deceased. age 40 Siblings. Deceased. Sister, age 57, Blood Clots    Social History Exercise. Exercises rarely; does other Drug/Alcohol Rehab (Currently). no Illicit drug use. no Marital status. married Living situation. live with spouse Alcohol use. current drinker; drinks wine; only occasionally per week Current work status. 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 yes Number of flights of stairs before winded. less than 1 Previously in rehab. no Tobacco use. Never smoker. never smoker Tobacco / smoke exposure. no Drug/Alcohol Rehab (Previously).  no Post-Surgical Plans. Skilled Rehab  Medication History Butalbital-ASA-Caff-Codeine (50-325-40-30MG  Capsule, Oral) Active. Actos (30MG  Tablet, Oral) Active. Oxycodone-Acetaminophen (5-325MG  Tablet, Oral) Active. Lipitor (10MG  Tablet, Oral) Active. Furosemide (40MG  Tablet, Oral) Active. Vitamin B-12 (1000MCG/ML Solution, Injection) Inactive. Lisinopril (10MG  Tablet, Oral) Active. MetFORMIN HCl (1000MG  Tablet, Oral) Active. Sertraline HCl (100MG  Tablet, Oral) Active. Metoclopramide HCl (10MG  Tablet, Oral) Active. NexIUM (20MG  Capsule DR, Oral) Active.  Past Surgical History Spinal Fusion. Date: 2012. lower back Hysterectomy. Date: 63. Partial Spinal Surgery Arthroscopy of Knee. Date: 2007. right bilateral Gallbladder Surgery. Date: 8. open Foot Surgery. right Bartholian Gland Cyst Removal. Date: 1983.   Review of Systems General:Present- Night Sweats, Fatigue and Memory Loss. Not Present- Chills, Fever, Weight Gain and Weight Loss. Skin:Not Present- Hives, Itching, Rash, Eczema and Lesions. HEENT:Present- Tinnitus, Headache and Hearing Loss. Not Present- Double Vision, Visual Loss and Dentures. Respiratory:Present- Shortness of breath with exertion, Cough and Wheezing. Not Present- Shortness of breath at rest, Allergies, Coughing up blood and Chronic Cough. Cardiovascular:Not Present- Chest Pain, Racing/skipping heartbeats, Difficulty Breathing Lying Down, Murmur, Swelling and Palpitations. Gastrointestinal:Present- Diarrhea. Not Present- Bloody Stool, Heartburn, Abdominal Pain, Vomiting, Nausea, Constipation, Difficulty Swallowing, Jaundice and Loss of appetitie. Female Genitourinary:Present- Incontinence and Urinating at Night. Not Present- Blood in Urine, Urinary frequency, Weak urinary stream, Discharge, Flank Pain, Painful Urination, Urgency and Urinary Retention. Musculoskeletal:Present- Joint Pain, Back Pain and Morning Stiffness. Not Present-  Muscle Weakness, Muscle Pain, Joint Swelling and Spasms. Neurological:Not Present- Tremor, Dizziness, Blackout spells, Paralysis, Difficulty with balance and Weakness. Psychiatric:Not Present- Insomnia.    Vitals Weight: 222 lb Height: 62 in Weight was reported by patient. Height was reported by patient. Body Surface Area: 2.1 m Body Mass Index: 40.6 kg/m Pulse: 64 (Regular) Resp.: 14 (Unlabored) BP: 128/70 (Sitting, Left Arm, Standard)     Physical Exam The physical exam findings are as follows:   General Mental Status - Alert, cooperative and good historian. General Appearance- pleasant. Not in acute distress. Orientation- Oriented X3. Build & Nutrition- Well nourished and Well developed.   Head and Neck Head- normocephalic, atraumatic . Neck Global Assessment- supple. no bruit auscultated on the right and no bruit auscultated on the left.   Eye Vision- Wears corrective lenses. Pupil- Bilateral- Regular and Round. Motion- Bilateral- EOMI.   Chest and Lung Exam Auscultation: Breath sounds:- clear at anterior chest wall and - clear at posterior chest wall. Adventitious sounds:- No Adventitious sounds.   Cardiovascular Auscultation:Rhythm- Regular rate and rhythm. Heart Sounds- S1 WNL and S2 WNL. Murmurs & Other Heart Sounds:Auscultation of the heart reveals - No Murmurs.   Abdomen Palpation/Percussion:Tenderness- Abdomen is non-tender to palpation. Rigidity (guarding)- Abdomen is soft. Auscultation:Auscultation of the abdomen reveals - Bowel sounds normal.   Female Genitourinary Not done, not pertinent to present illness  Musculoskeletal On exam she is alert and oriented in no apparent distress. Evaluation of her hips normal range of motion, no discomfort. Left knee no effusion. Slight varus deformity. Range 5 to 125. Moderate crepitus on range of motion. Tenderness medial greater than lateral with no  instability. Right knee no deformity, range 5 to 125, moderate crepitus on range of motion, tenderness medial greater than lateral, no instability noted.  RADIOGRAPHS: AP both knees and lateral show that she has bone on bone arthritis in the medial compartment and patellofemoral compartment of the left knee. Right knee shows just about bone on bone medial and patellofemoral.   Assessment & Plan Osteoarthritis, Knee (715.96) Impression: Left Knee  Note: Plan is for a Left Total Knee Replacement by  Dr. Lequita HaltAluisio.  Plan is to go to a rehab facility.  PCP - Dr. Theressa MillardJames Osborne - Patient has been seen preoperatively and felt to be stable for surgery.  The patient does not have any contraindications and will receive TXA (tranexamic acid) prior to surgery.  Signed electronically by Lauraine RinneAlexzandrew L Perkins, [name]

## 2013-05-27 NOTE — Anesthesia Preprocedure Evaluation (Addendum)
Anesthesia Evaluation  Patient identified by MRN, date of birth, ID band Patient awake    Reviewed: Allergy & Precautions, H&P , NPO status , Patient's Chart, lab work & pertinent test results  History of Anesthesia Complications (+) PONV  Airway Mallampati: II TM Distance: >3 FB Neck ROM: Full    Dental  (+) Teeth Intact,    Pulmonary shortness of breath and with exertion,  breath sounds clear to auscultation        Cardiovascular Exercise Tolerance: Good hypertension, Pt. on medications Rhythm:Regular Rate:Normal     Neuro/Psych  Headaches, Anxiety Anterior lateral lumbar fusion 12/12 negative neurological ROS  negative psych ROS   GI/Hepatic Neg liver ROS, GERD-  Medicated and Controlled,  Endo/Other  negative endocrine ROSdiabetes, Type 2, Oral Hypoglycemic AgentsMorbid obesity  Renal/GU negative Renal ROS  negative genitourinary   Musculoskeletal  (+) Fibromyalgia -  Abdominal (+) + obese,   Peds  Hematology negative hematology ROS (+)   Anesthesia Other Findings   Reproductive/Obstetrics negative OB ROS                         Anesthesia Physical Anesthesia Plan  ASA: III  Anesthesia Plan: General   Post-op Pain Management:    Induction: Intravenous  Airway Management Planned: Oral ETT  Additional Equipment:   Intra-op Plan:   Post-operative Plan: Extubation in OR  Informed Consent: I have reviewed the patients History and Physical, chart, labs and discussed the procedure including the risks, benefits and alternatives for the proposed anesthesia with the patient or authorized representative who has indicated his/her understanding and acceptance.   Dental advisory given  Plan Discussed with: CRNA  Anesthesia Plan Comments:         Anesthesia Quick Evaluation

## 2013-05-28 ENCOUNTER — Encounter (HOSPITAL_COMMUNITY)
Admission: RE | Disposition: A | Discharge status: Skilled Nursing Facility | Payer: Self-pay | Source: Ambulatory Visit | Attending: Orthopedic Surgery

## 2013-05-28 ENCOUNTER — Inpatient Hospital Stay (HOSPITAL_COMMUNITY)
Admission: RE | Admit: 2013-05-28 | Discharge: 2013-05-31 | DRG: 470 | Disposition: A | Discharge status: Skilled Nursing Facility | Payer: Medicare Other | Source: Ambulatory Visit | Attending: Orthopedic Surgery | Admitting: Orthopedic Surgery

## 2013-05-28 ENCOUNTER — Encounter (HOSPITAL_COMMUNITY): Payer: Medicare Other | Admitting: Anesthesiology

## 2013-05-28 ENCOUNTER — Encounter (HOSPITAL_COMMUNITY): Payer: Self-pay

## 2013-05-28 ENCOUNTER — Inpatient Hospital Stay (HOSPITAL_COMMUNITY): Payer: Medicare Other | Admitting: Anesthesiology

## 2013-05-28 DIAGNOSIS — E1142 Type 2 diabetes mellitus with diabetic polyneuropathy: Secondary | ICD-10-CM | POA: Diagnosis present

## 2013-05-28 DIAGNOSIS — IMO0001 Reserved for inherently not codable concepts without codable children: Secondary | ICD-10-CM | POA: Diagnosis present

## 2013-05-28 DIAGNOSIS — E1149 Type 2 diabetes mellitus with other diabetic neurological complication: Secondary | ICD-10-CM | POA: Diagnosis present

## 2013-05-28 DIAGNOSIS — F411 Generalized anxiety disorder: Secondary | ICD-10-CM | POA: Diagnosis present

## 2013-05-28 DIAGNOSIS — M1710 Unilateral primary osteoarthritis, unspecified knee: Secondary | ICD-10-CM | POA: Diagnosis not present

## 2013-05-28 DIAGNOSIS — Z6841 Body Mass Index (BMI) 40.0 and over, adult: Secondary | ICD-10-CM

## 2013-05-28 DIAGNOSIS — I10 Essential (primary) hypertension: Secondary | ICD-10-CM | POA: Diagnosis present

## 2013-05-28 DIAGNOSIS — D62 Acute posthemorrhagic anemia: Secondary | ICD-10-CM | POA: Diagnosis not present

## 2013-05-28 DIAGNOSIS — Z981 Arthrodesis status: Secondary | ICD-10-CM

## 2013-05-28 DIAGNOSIS — E871 Hypo-osmolality and hyponatremia: Secondary | ICD-10-CM

## 2013-05-28 DIAGNOSIS — M179 Osteoarthritis of knee, unspecified: Secondary | ICD-10-CM

## 2013-05-28 DIAGNOSIS — Z8261 Family history of arthritis: Secondary | ICD-10-CM

## 2013-05-28 DIAGNOSIS — K219 Gastro-esophageal reflux disease without esophagitis: Secondary | ICD-10-CM | POA: Diagnosis present

## 2013-05-28 DIAGNOSIS — Z96652 Presence of left artificial knee joint: Secondary | ICD-10-CM

## 2013-05-28 DIAGNOSIS — Z79899 Other long term (current) drug therapy: Secondary | ICD-10-CM

## 2013-05-28 DIAGNOSIS — Z8249 Family history of ischemic heart disease and other diseases of the circulatory system: Secondary | ICD-10-CM

## 2013-05-28 HISTORY — PX: TOTAL KNEE ARTHROPLASTY: SHX125

## 2013-05-28 LAB — GLUCOSE, CAPILLARY
Glucose-Capillary: 136 mg/dL — ABNORMAL HIGH (ref 70–99)
Glucose-Capillary: 171 mg/dL — ABNORMAL HIGH (ref 70–99)
Glucose-Capillary: 188 mg/dL — ABNORMAL HIGH (ref 70–99)
Glucose-Capillary: 162 mg/dL — ABNORMAL HIGH (ref 70–99)
Glucose-Capillary: 133 mg/dL — ABNORMAL HIGH (ref 70–99)

## 2013-05-28 LAB — TYPE AND SCREEN
ABO/RH(D): O NEG
Antibody Screen: NEGATIVE

## 2013-05-28 SURGERY — ARTHROPLASTY, KNEE, TOTAL
Anesthesia: General | Site: Knee | Laterality: Left

## 2013-05-28 MED ORDER — SODIUM CHLORIDE 0.9 % IV SOLN
INTRAVENOUS | Status: DC
  Administered 2013-05-28: 12:00:00 via INTRAVENOUS

## 2013-05-28 MED ORDER — ACETAMINOPHEN 650 MG RE SUPP: 650.0000 mg | Freq: Four times a day (QID) | RECTAL | Status: DC | PRN

## 2013-05-28 MED ORDER — METFORMIN HCL 500 MG PO TABS
1000.0000 mg | ORAL_TABLET | Freq: Two times a day (BID) | ORAL | Status: DC
  Administered 2013-05-28 – 2013-05-29 (×2): 1000 mg via ORAL
  Filled 2013-05-28 (×4): qty 2

## 2013-05-28 MED ORDER — FUROSEMIDE 40 MG PO TABS
40.0000 mg | ORAL_TABLET | Freq: Every morning | ORAL | Status: DC
  Administered 2013-05-28 – 2013-05-31 (×2): 40 mg via ORAL
  Filled 2013-05-28 (×4): qty 1

## 2013-05-28 MED ORDER — ONDANSETRON HCL 4 MG/2ML IJ SOLN
INTRAMUSCULAR | Status: DC | PRN
  Administered 2013-05-28 (×2): 2 mg via INTRAVENOUS

## 2013-05-28 MED ORDER — SODIUM CHLORIDE 0.9 % IJ SOLN
INTRAMUSCULAR | Status: DC | PRN
  Administered 2013-05-28: 08:00:00

## 2013-05-28 MED ORDER — ACETAMINOPHEN 500 MG PO TABS
1000.0000 mg | ORAL_TABLET | Freq: Once | ORAL | Status: AC
  Administered 2013-05-28: 1000 mg via ORAL
  Filled 2013-05-28: qty 2

## 2013-05-28 MED ORDER — RIVAROXABAN 10 MG PO TABS
10.0000 mg | ORAL_TABLET | Freq: Every day | ORAL | Status: DC
  Administered 2013-05-29 – 2013-05-31 (×3): 10 mg via ORAL
  Filled 2013-05-28 (×4): qty 1

## 2013-05-28 MED ORDER — PHENOL 1.4 % MT LIQD
1.0000 | OROMUCOSAL | Status: DC | PRN
  Filled 2013-05-28: qty 177

## 2013-05-28 MED ORDER — BUPIVACAINE LIPOSOME 1.3 % IJ SUSP
20.0000 mL | Freq: Once | INTRAMUSCULAR | Status: DC
  Filled 2013-05-28: qty 20

## 2013-05-28 MED ORDER — POLYETHYLENE GLYCOL 3350 17 G PO PACK: 17.0000 g | PACK | Freq: Every day | ORAL | Status: DC | PRN

## 2013-05-28 MED ORDER — CEFAZOLIN SODIUM-DEXTROSE 2-3 GM-% IV SOLR
2.0000 g | Freq: Four times a day (QID) | INTRAVENOUS | Status: AC
  Administered 2013-05-28 (×2): 2 g via INTRAVENOUS
  Filled 2013-05-28 (×2): qty 50

## 2013-05-28 MED ORDER — INSULIN ASPART 100 UNIT/ML ~~LOC~~ SOLN
0.0000 [IU] | Freq: Three times a day (TID) | SUBCUTANEOUS | Status: DC
  Administered 2013-05-28 (×2): 3 [IU] via SUBCUTANEOUS
  Administered 2013-05-29: 5 [IU] via SUBCUTANEOUS
  Administered 2013-05-29 – 2013-05-30 (×2): 3 [IU] via SUBCUTANEOUS
  Administered 2013-05-30 – 2013-05-31 (×2): 2 [IU] via SUBCUTANEOUS

## 2013-05-28 MED ORDER — ONDANSETRON HCL 4 MG/2ML IJ SOLN
INTRAMUSCULAR | Status: AC
  Filled 2013-05-28: qty 2

## 2013-05-28 MED ORDER — NEOSTIGMINE METHYLSULFATE 1 MG/ML IJ SOLN
INTRAMUSCULAR | Status: AC
  Filled 2013-05-28: qty 10

## 2013-05-28 MED ORDER — ONDANSETRON HCL 4 MG PO TABS: 4.0000 mg | ORAL_TABLET | Freq: Four times a day (QID) | ORAL | Status: DC | PRN

## 2013-05-28 MED ORDER — PROPOFOL 10 MG/ML IV BOLUS
INTRAVENOUS | Status: AC
  Filled 2013-05-28: qty 20

## 2013-05-28 MED ORDER — SUCCINYLCHOLINE CHLORIDE 20 MG/ML IJ SOLN
INTRAMUSCULAR | Status: DC | PRN
  Administered 2013-05-28: 140 mg via INTRAVENOUS

## 2013-05-28 MED ORDER — FENTANYL CITRATE 0.05 MG/ML IJ SOLN
INTRAMUSCULAR | Status: AC
  Filled 2013-05-28: qty 2

## 2013-05-28 MED ORDER — SODIUM CHLORIDE 0.9 % IJ SOLN
INTRAMUSCULAR | Status: AC
  Filled 2013-05-28: qty 50

## 2013-05-28 MED ORDER — PANTOPRAZOLE SODIUM 40 MG PO TBEC
40.0000 mg | DELAYED_RELEASE_TABLET | Freq: Two times a day (BID) | ORAL | Status: DC
  Administered 2013-05-28: 40 mg via ORAL
  Filled 2013-05-28 (×3): qty 1

## 2013-05-28 MED ORDER — CEFAZOLIN SODIUM-DEXTROSE 2-3 GM-% IV SOLR
INTRAVENOUS | Status: AC
  Filled 2013-05-28: qty 50

## 2013-05-28 MED ORDER — KETAMINE HCL 10 MG/ML IJ SOLN
INTRAMUSCULAR | Status: DC | PRN
  Administered 2013-05-28: 25 mg via INTRAVENOUS

## 2013-05-28 MED ORDER — CHLORHEXIDINE GLUCONATE CLOTH 2 % EX PADS: 6.0000 | MEDICATED_PAD | Freq: Once | CUTANEOUS | Status: DC

## 2013-05-28 MED ORDER — FLEET ENEMA 7-19 GM/118ML RE ENEM: 1.0000 | ENEMA | Freq: Once | RECTAL | Status: AC | PRN

## 2013-05-28 MED ORDER — SERTRALINE HCL 100 MG PO TABS
100.0000 mg | ORAL_TABLET | Freq: Every morning | ORAL | Status: DC
  Administered 2013-05-29 – 2013-05-31 (×3): 100 mg via ORAL
  Filled 2013-05-28 (×4): qty 1

## 2013-05-28 MED ORDER — TRANEXAMIC ACID 100 MG/ML IV SOLN
1000.0000 mg | INTRAVENOUS | Status: AC
  Administered 2013-05-28: 1000 mg via INTRAVENOUS
  Filled 2013-05-28: qty 10

## 2013-05-28 MED ORDER — MORPHINE SULFATE 2 MG/ML IJ SOLN
INTRAMUSCULAR | Status: AC
  Filled 2013-05-28: qty 1

## 2013-05-28 MED ORDER — DOCUSATE SODIUM 100 MG PO CAPS
100.0000 mg | ORAL_CAPSULE | Freq: Two times a day (BID) | ORAL | Status: DC
  Administered 2013-05-28 – 2013-05-31 (×6): 100 mg via ORAL

## 2013-05-28 MED ORDER — DEXAMETHASONE SODIUM PHOSPHATE 10 MG/ML IJ SOLN
10.0000 mg | Freq: Once | INTRAMUSCULAR | Status: AC
  Administered 2013-05-28: 10 mg via INTRAVENOUS

## 2013-05-28 MED ORDER — CEFAZOLIN SODIUM-DEXTROSE 2-3 GM-% IV SOLR
2.0000 g | INTRAVENOUS | Status: AC
  Administered 2013-05-28: 2 g via INTRAVENOUS

## 2013-05-28 MED ORDER — SODIUM CHLORIDE 0.9 % IJ SOLN
INTRAMUSCULAR | Status: AC
  Filled 2013-05-28: qty 10

## 2013-05-28 MED ORDER — EPHEDRINE SULFATE 50 MG/ML IJ SOLN
INTRAMUSCULAR | Status: AC
  Filled 2013-05-28: qty 1

## 2013-05-28 MED ORDER — ACETAMINOPHEN 325 MG PO TABS: 650.0000 mg | ORAL_TABLET | Freq: Four times a day (QID) | ORAL | Status: DC | PRN

## 2013-05-28 MED ORDER — MIDAZOLAM HCL 5 MG/5ML IJ SOLN
INTRAMUSCULAR | Status: DC | PRN
  Administered 2013-05-28: 1 mg via INTRAVENOUS

## 2013-05-28 MED ORDER — TRAMADOL HCL 50 MG PO TABS: 50.0000 mg | ORAL_TABLET | Freq: Four times a day (QID) | ORAL | Status: DC | PRN

## 2013-05-28 MED ORDER — ATORVASTATIN CALCIUM 10 MG PO TABS
10.0000 mg | ORAL_TABLET | Freq: Every day | ORAL | Status: DC
Start: 2013-05-28 — End: 2013-05-31
  Administered 2013-05-28 – 2013-05-30 (×3): 10 mg via ORAL
  Filled 2013-05-28 (×5): qty 1

## 2013-05-28 MED ORDER — HYDROMORPHONE HCL PF 2 MG/ML IJ SOLN
INTRAMUSCULAR | Status: AC
  Filled 2013-05-28: qty 1

## 2013-05-28 MED ORDER — NEOSTIGMINE METHYLSULFATE 1 MG/ML IJ SOLN
INTRAMUSCULAR | Status: DC | PRN
  Administered 2013-05-28: 3 mg via INTRAVENOUS

## 2013-05-28 MED ORDER — BISACODYL 10 MG RE SUPP: 10.0000 mg | Freq: Every day | RECTAL | Status: DC | PRN

## 2013-05-28 MED ORDER — HYDROMORPHONE HCL PF 1 MG/ML IJ SOLN
INTRAMUSCULAR | Status: DC | PRN
  Administered 2013-05-28 (×3): 0.5 mg via INTRAVENOUS

## 2013-05-28 MED ORDER — HYDROMORPHONE HCL PF 1 MG/ML IJ SOLN
INTRAMUSCULAR | Status: AC
  Filled 2013-05-28: qty 1

## 2013-05-28 MED ORDER — DEXAMETHASONE SODIUM PHOSPHATE 10 MG/ML IJ SOLN
10.0000 mg | Freq: Every day | INTRAMUSCULAR | Status: AC
  Filled 2013-05-28: qty 1

## 2013-05-28 MED ORDER — PROMETHAZINE HCL 25 MG/ML IJ SOLN: 6.2500 mg | INTRAMUSCULAR | Status: DC | PRN

## 2013-05-28 MED ORDER — KETOROLAC TROMETHAMINE 15 MG/ML IJ SOLN
7.5000 mg | Freq: Four times a day (QID) | INTRAMUSCULAR | Status: AC | PRN
  Administered 2013-05-28 – 2013-05-29 (×3): 7.5 mg via INTRAVENOUS
  Filled 2013-05-28 (×3): qty 1

## 2013-05-28 MED ORDER — METOCLOPRAMIDE HCL 10 MG PO TABS
10.0000 mg | ORAL_TABLET | Freq: Two times a day (BID) | ORAL | Status: DC
  Administered 2013-05-28 – 2013-05-31 (×6): 10 mg via ORAL
  Filled 2013-05-28 (×7): qty 1

## 2013-05-28 MED ORDER — ACETAMINOPHEN 500 MG PO TABS
1000.0000 mg | ORAL_TABLET | Freq: Four times a day (QID) | ORAL | Status: AC
  Administered 2013-05-28 – 2013-05-29 (×3): 1000 mg via ORAL
  Filled 2013-05-28 (×3): qty 2

## 2013-05-28 MED ORDER — MENTHOL 3 MG MT LOZG
1.0000 | LOZENGE | OROMUCOSAL | Status: DC | PRN
  Filled 2013-05-28: qty 9

## 2013-05-28 MED ORDER — LACTATED RINGERS IV SOLN
INTRAVENOUS | Status: DC | PRN
  Administered 2013-05-28: 07:00:00 via INTRAVENOUS

## 2013-05-28 MED ORDER — KETAMINE HCL 10 MG/ML IJ SOLN
INTRAMUSCULAR | Status: AC
  Filled 2013-05-28: qty 1

## 2013-05-28 MED ORDER — OXYCODONE HCL 5 MG PO TABS
5.0000 mg | ORAL_TABLET | ORAL | Status: DC | PRN
  Administered 2013-05-28 – 2013-05-29 (×3): 10 mg via ORAL
  Administered 2013-05-29 – 2013-05-31 (×11): 15 mg via ORAL
  Filled 2013-05-28: qty 3
  Filled 2013-05-28: qty 2
  Filled 2013-05-28 (×4): qty 3
  Filled 2013-05-28: qty 2
  Filled 2013-05-28: qty 3
  Filled 2013-05-28: qty 2
  Filled 2013-05-28: qty 1
  Filled 2013-05-28 (×2): qty 3
  Filled 2013-05-28: qty 2
  Filled 2013-05-28 (×2): qty 3

## 2013-05-28 MED ORDER — DEXAMETHASONE 6 MG PO TABS
10.0000 mg | ORAL_TABLET | Freq: Every day | ORAL | Status: AC
  Administered 2013-05-29: 10 mg via ORAL
  Filled 2013-05-28: qty 1

## 2013-05-28 MED ORDER — FENTANYL CITRATE 0.05 MG/ML IJ SOLN
INTRAMUSCULAR | Status: DC | PRN
  Administered 2013-05-28: 50 ug via INTRAVENOUS
  Administered 2013-05-28: 25 ug via INTRAVENOUS
  Administered 2013-05-28: 75 ug via INTRAVENOUS
  Administered 2013-05-28 (×2): 50 ug via INTRAVENOUS

## 2013-05-28 MED ORDER — PIOGLITAZONE HCL 30 MG PO TABS
30.0000 mg | ORAL_TABLET | Freq: Every day | ORAL | Status: DC
  Administered 2013-05-28 – 2013-05-31 (×4): 30 mg via ORAL
  Filled 2013-05-28 (×5): qty 1

## 2013-05-28 MED ORDER — DEXAMETHASONE SODIUM PHOSPHATE 10 MG/ML IJ SOLN
INTRAMUSCULAR | Status: AC
  Filled 2013-05-28: qty 1

## 2013-05-28 MED ORDER — CHLORHEXIDINE GLUCONATE 4 % EX LIQD: 60.0000 mL | Freq: Once | CUTANEOUS | Status: DC

## 2013-05-28 MED ORDER — BUPIVACAINE HCL 0.25 % IJ SOLN
INTRAMUSCULAR | Status: DC | PRN
  Administered 2013-05-28: 20 mL

## 2013-05-28 MED ORDER — ONDANSETRON HCL 4 MG/2ML IJ SOLN: 4.0000 mg | Freq: Four times a day (QID) | INTRAMUSCULAR | Status: DC | PRN

## 2013-05-28 MED ORDER — MORPHINE SULFATE 2 MG/ML IJ SOLN
1.0000 mg | INTRAMUSCULAR | Status: DC | PRN
  Administered 2013-05-28: 2 mg via INTRAVENOUS

## 2013-05-28 MED ORDER — PROPOFOL 10 MG/ML IV BOLUS
INTRAVENOUS | Status: DC | PRN
  Administered 2013-05-28: 50 mg via INTRAVENOUS
  Administered 2013-05-28: 200 mg via INTRAVENOUS

## 2013-05-28 MED ORDER — EPHEDRINE SULFATE 50 MG/ML IJ SOLN
INTRAMUSCULAR | Status: DC | PRN
  Administered 2013-05-28: 10 mg via INTRAVENOUS

## 2013-05-28 MED ORDER — LIDOCAINE HCL (CARDIAC) 20 MG/ML IV SOLN
INTRAVENOUS | Status: DC | PRN
  Administered 2013-05-28: 30 mg via INTRAVENOUS

## 2013-05-28 MED ORDER — HYDROMORPHONE HCL PF 1 MG/ML IJ SOLN
0.2500 mg | INTRAMUSCULAR | Status: DC | PRN
  Administered 2013-05-28: 0.5 mg via INTRAVENOUS

## 2013-05-28 MED ORDER — METOCLOPRAMIDE HCL 10 MG PO TABS: 5.0000 mg | ORAL_TABLET | Freq: Three times a day (TID) | ORAL | Status: DC | PRN

## 2013-05-28 MED ORDER — LIDOCAINE HCL (CARDIAC) 20 MG/ML IV SOLN
INTRAVENOUS | Status: AC
  Filled 2013-05-28: qty 5

## 2013-05-28 MED ORDER — METOCLOPRAMIDE HCL 5 MG/ML IJ SOLN: 5.0000 mg | Freq: Three times a day (TID) | INTRAMUSCULAR | Status: DC | PRN

## 2013-05-28 MED ORDER — MIDAZOLAM HCL 2 MG/2ML IJ SOLN
INTRAMUSCULAR | Status: AC
  Filled 2013-05-28: qty 2

## 2013-05-28 MED ORDER — BUPIVACAINE HCL (PF) 0.25 % IJ SOLN
INTRAMUSCULAR | Status: AC
  Filled 2013-05-28: qty 30

## 2013-05-28 MED ORDER — CISATRACURIUM BESYLATE 20 MG/10ML IV SOLN
INTRAVENOUS | Status: AC
  Filled 2013-05-28: qty 10

## 2013-05-28 MED ORDER — METHOCARBAMOL 500 MG PO TABS
500.0000 mg | ORAL_TABLET | Freq: Four times a day (QID) | ORAL | Status: DC | PRN
  Administered 2013-05-29 – 2013-05-30 (×5): 500 mg via ORAL
  Filled 2013-05-28 (×5): qty 1

## 2013-05-28 MED ORDER — GLYCOPYRROLATE 0.2 MG/ML IJ SOLN
INTRAMUSCULAR | Status: DC | PRN
  Administered 2013-05-28: 0.4 mg via INTRAVENOUS

## 2013-05-28 MED ORDER — SODIUM CHLORIDE 0.9 % IV SOLN
INTRAVENOUS | Status: DC
Start: 2013-05-28 — End: 2013-05-28

## 2013-05-28 MED ORDER — LACTATED RINGERS IV SOLN: INTRAVENOUS | Status: DC

## 2013-05-28 MED ORDER — METHOCARBAMOL 100 MG/ML IJ SOLN
500.0000 mg | Freq: Four times a day (QID) | INTRAVENOUS | Status: DC | PRN
  Administered 2013-05-28 (×2): 500 mg via INTRAVENOUS
  Filled 2013-05-28 (×2): qty 5

## 2013-05-28 MED ORDER — GLYCOPYRROLATE 0.2 MG/ML IJ SOLN
INTRAMUSCULAR | Status: AC
Start: 2013-05-28 — End: 2013-05-28
  Filled 2013-05-28: qty 2

## 2013-05-28 MED ORDER — CISATRACURIUM BESYLATE (PF) 10 MG/5ML IV SOLN
INTRAVENOUS | Status: DC | PRN
  Administered 2013-05-28: 4 mg via INTRAVENOUS
  Administered 2013-05-28: 6 mg via INTRAVENOUS

## 2013-05-28 MED ORDER — DIPHENHYDRAMINE HCL 12.5 MG/5ML PO ELIX: 12.5000 mg | ORAL_SOLUTION | ORAL | Status: DC | PRN

## 2013-05-28 SURGICAL SUPPLY — 55 items
BAG SPEC THK2 15X12 ZIP CLS (MISCELLANEOUS)
BAG ZIPLOCK 12X15 (MISCELLANEOUS) ×1 IMPLANT
BANDAGE ELASTIC 6 VELCRO ST LF (GAUZE/BANDAGES/DRESSINGS) ×2 IMPLANT
BANDAGE ESMARK 6X9 LF (GAUZE/BANDAGES/DRESSINGS) ×1 IMPLANT
BLADE SAG 18X100X1.27 (BLADE) ×2 IMPLANT
BLADE SAW SGTL 11.0X1.19X90.0M (BLADE) ×2 IMPLANT
BNDG CMPR 9X6 STRL LF SNTH (GAUZE/BANDAGES/DRESSINGS) ×1
BNDG ESMARK 6X9 LF (GAUZE/BANDAGES/DRESSINGS) ×2
BOWL SMART MIX CTS (DISPOSABLE) ×2 IMPLANT
CAPT RP KNEE ×1 IMPLANT
CEMENT HV SMART SET (Cement) ×4 IMPLANT
CUFF TOURN SGL QUICK 34 (TOURNIQUET CUFF) ×2
CUFF TRNQT CYL 34X4X40X1 (TOURNIQUET CUFF) ×1 IMPLANT
DECANTER SPIKE VIAL GLASS SM (MISCELLANEOUS) ×2 IMPLANT
DRAPE EXTREMITY T 121X128X90 (DRAPE) ×2 IMPLANT
DRAPE POUCH INSTRU U-SHP 10X18 (DRAPES) ×2 IMPLANT
DRAPE U-SHAPE 47X51 STRL (DRAPES) ×2 IMPLANT
DRSG ADAPTIC 3X8 NADH LF (GAUZE/BANDAGES/DRESSINGS) ×2 IMPLANT
DURAPREP 26ML APPLICATOR (WOUND CARE) ×2 IMPLANT
ELECT REM PT RETURN 9FT ADLT (ELECTROSURGICAL) ×2
ELECTRODE REM PT RTRN 9FT ADLT (ELECTROSURGICAL) ×1 IMPLANT
EVACUATOR 1/8 PVC DRAIN (DRAIN) ×2 IMPLANT
FACESHIELD LNG OPTICON STERILE (SAFETY) ×10 IMPLANT
GLOVE BIO SURGEON STRL SZ7.5 (GLOVE) ×5 IMPLANT
GLOVE BIO SURGEON STRL SZ8 (GLOVE) ×1 IMPLANT
GLOVE BIOGEL PI IND STRL 8 (GLOVE) ×2 IMPLANT
GLOVE BIOGEL PI INDICATOR 8 (GLOVE) ×1
GLOVE SURG SS PI 6.5 STRL IVOR (GLOVE) ×1 IMPLANT
GOWN STRL REUS W/TWL LRG LVL3 (GOWN DISPOSABLE) ×2 IMPLANT
GOWN STRL REUS W/TWL XL LVL3 (GOWN DISPOSABLE) IMPLANT
HANDPIECE INTERPULSE COAX TIP (DISPOSABLE) ×2
IMMOBILIZER KNEE 20 (SOFTGOODS) ×2 IMPLANT
KIT BASIN OR (CUSTOM PROCEDURE TRAY) ×2 IMPLANT
MANIFOLD NEPTUNE II (INSTRUMENTS) ×2 IMPLANT
NDL SAFETY ECLIPSE 18X1.5 (NEEDLE) ×2 IMPLANT
NEEDLE HYPO 18GX1.5 SHARP (NEEDLE) ×4
NS IRRIG 1000ML POUR BTL (IV SOLUTION) ×2 IMPLANT
PACK TOTAL JOINT (CUSTOM PROCEDURE TRAY) ×2 IMPLANT
PAD ABD 8X10 STRL (GAUZE/BANDAGES/DRESSINGS) ×2 IMPLANT
PADDING CAST COTTON 6X4 STRL (CAST SUPPLIES) ×6 IMPLANT
POSITIONER SURGICAL ARM (MISCELLANEOUS) ×2 IMPLANT
SET HNDPC FAN SPRY TIP SCT (DISPOSABLE) ×1 IMPLANT
SPONGE GAUZE 4X4 12PLY (GAUZE/BANDAGES/DRESSINGS) ×2 IMPLANT
STRIP CLOSURE SKIN 1/2X4 (GAUZE/BANDAGES/DRESSINGS) ×4 IMPLANT
SUCTION FRAZIER 12FR DISP (SUCTIONS) ×2 IMPLANT
SUT MNCRL AB 4-0 PS2 18 (SUTURE) ×2 IMPLANT
SUT VIC AB 2-0 CT1 27 (SUTURE) ×6
SUT VIC AB 2-0 CT1 TAPERPNT 27 (SUTURE) ×3 IMPLANT
SUT VLOC 180 0 24IN GS25 (SUTURE) ×2 IMPLANT
SYR 20CC LL (SYRINGE) ×2 IMPLANT
SYR 50ML LL SCALE MARK (SYRINGE) ×2 IMPLANT
TOWEL OR 17X26 10 PK STRL BLUE (TOWEL DISPOSABLE) ×4 IMPLANT
TRAY FOLEY CATH 14FRSI W/METER (CATHETERS) ×2 IMPLANT
WATER STERILE IRR 1500ML POUR (IV SOLUTION) ×2 IMPLANT
WRAP KNEE MAXI GEL POST OP (GAUZE/BANDAGES/DRESSINGS) ×2 IMPLANT

## 2013-05-28 NOTE — Op Note (Signed)
Pre-operative diagnosis- Osteoarthritis  Left knee(s)  Post-operative diagnosis- Osteoarthritis Left knee(s)  Procedure-  Left  Total Knee Arthroplasty  Surgeon- Gus RankinFrank V. Arvel Oquinn, MD  Assistant- Dimitri PedAmber Constable, PA-C   Anesthesia-  General EBL-* No blood loss amount entered *  Drains Hemovac  Tourniquet time- 36 minutes @300  mm Hg  Complications- None  Condition-PACU - hemodynamically stable.   Brief Clinical Note  Vanessa HanlonLinda R Frank is a 68 y.o. year old female with end stage OA of her left knee with progressively worsening pain and dysfunction. She has constant pain, with activity and at rest and significant functional deficits with difficulties even with ADLs. She has had extensive non-op management including analgesics, injections of cortisone and viscosupplements, and home exercise program, but remains in significant pain with significant dysfunction. Radiographs show bone on bone arthritis medial and patellofemoral. She presents now for left Total Knee Arthroplasty.    Procedure in detail---   The patient is brought into the operating room and positioned supine on the operating table. After successful administration of  General,   a tourniquet is placed high on the  Left thigh(s) and the lower extremity is prepped and draped in the usual sterile fashion. Time out is performed by the operating team and then the  Left lower extremity is wrapped in Esmarch, knee flexed and the tourniquet inflated to 300 mmHg.       A midline incision is made with a ten blade through the subcutaneous tissue to the level of the extensor mechanism. A fresh blade is used to make a medial parapatellar arthrotomy. Soft tissue over the proximal medial tibia is subperiosteally elevated to the joint line with a knife and into the semimembranosus bursa with a Cobb elevator. Soft tissue over the proximal lateral tibia is elevated with attention being paid to avoiding the patellar tendon on the tibial tubercle. The  patella is everted, knee flexed 90 degrees and the ACL and PCL are removed. Findings are bone on bone medial and patellofemoral with large medial osteophytes.        The drill is used to create a starting hole in the distal femur and the canal is thoroughly irrigated with sterile saline to remove the fatty contents. The 5 degree Left  valgus alignment guide is placed into the femoral canal and the distal femoral cutting block is pinned to remove 10 mm off the distal femur. Resection is made with an oscillating saw.      The tibia is subluxed forward and the menisci are removed. The extramedullary alignment guide is placed referencing proximally at the medial aspect of the tibial tubercle and distally along the second metatarsal axis and tibial crest. The block is pinned to remove 2mm off the more deficient medial  side. Resection is made with an oscillating saw. Size 2.5is the most appropriate size for the tibia and the proximal tibia is prepared with the modular drill and keel punch for that size.      The femoral sizing guide is placed and size 2.5 is most appropriate. Rotation is marked off the epicondylar axis and confirmed by creating a rectangular flexion gap at 90 degrees. The size 2.5 cutting block is pinned in this rotation and the anterior, posterior and chamfer cuts are made with the oscillating saw. The intercondylar block is then placed and that cut is made.      Trial size 2.5 tibial component, trial size 2.5 posterior stabilized femur and a 10  mm posterior stabilized rotating platform insert  trial is placed. Full extension is achieved with excellent varus/valgus and anterior/posterior balance throughout full range of motion. The patella is everted and thickness measured to be 22  mm. Free hand resection is taken to 12 mm, a 35 template is placed, lug holes are drilled, trial patella is placed, and it tracks normally. Osteophytes are removed off the posterior femur with the trial in place. All  trials are removed and the cut bone surfaces prepared with pulsatile lavage. Cement is mixed and once ready for implantation, the size 2.5 tibial implant, size  2.5 posterior stabilized femoral component, and the size 35 patella are cemented in place and the patella is held with the clamp. The trial insert is placed and the knee held in full extension. The Exparel (20 ml mixed with 30 ml saline) and .25% Bupivicaine, are injected into the extensor mechanism, posterior capsule, medial and lateral gutters and subcutaneous tissues.  All extruded cement is removed and once the cement is hard the permanent 10 mm posterior stabilized rotating platform insert is placed into the tibial tray.      The wound is copiously irrigated with saline solution and the extensor mechanism closed over a hemovac drain with #1 PDS suture. The tourniquet is released for a total tourniquet time of 36  minutes. Flexion against gravity is 140 degrees and the patella tracks normally. Subcutaneous tissue is closed with 2.0 vicryl and subcuticular with running 4.0 Monocryl. The incision is cleaned and dried and steri-strips and a bulky sterile dressing are applied. The limb is placed into a knee immobilizer and the patient is awakened and transported to recovery in stable condition.      Please note that a surgical assistant was a medical necessity for this procedure in order to perform it in a safe and expeditious manner. Surgical assistant was necessary to retract the ligaments and vital neurovascular structures to prevent injury to them and also necessary for proper positioning of the limb to allow for anatomic placement of the prosthesis.   Gus Rankin Lajuan Godbee, MD    05/28/2013, 8:19 AM

## 2013-05-28 NOTE — Evaluation (Signed)
Physical Therapy Evaluation Patient Details Name: Vanessa HanlonLinda R Frank MRN: [id number] DOB: [birth year] Today's Date: 05/28/2013 Time: [id number] PT Time Calculation (min): 20 min  PT Assessment / Plan / Recommendation History of Present Illness  s/p L TKA  Clinical Impression  Pt will benefit from PT to address deficits below; Pt is planning for SNF, she wants Dr. Lequita HaltAluisio to choose for her    PT Assessment       Follow Up Recommendations  SNF    Does the patient have the potential to tolerate intense rehabilitation      Barriers to Discharge        Equipment Recommendations  None recommended by PT    Recommendations for Other Services     Frequency      Precautions / Restrictions Precautions Precautions: Knee Restrictions Other Position/Activity Restrictions: WBAT   Pertinent Vitals/Pain VSS, pain controlled      Mobility  Bed Mobility Overal bed mobility: Needs Assistance Bed Mobility: Supine to Sit Supine to sit: Mod assist General bed mobility comments: cues for technique and to self assist Transfers Overall transfer level: Needs assistance Equipment used: Rolling walker (2 wheeled) Transfers: Sit to/from Stand General transfer comment: cues for hand placement, LE management,  and  overall safe technique  Ambulation/Gait Ambulation/Gait assistance: Min assist Ambulation Distance (Feet): 60 Feet Assistive device: Rolling walker (2 wheeled) Gait Pattern/deviations: Step-to pattern Gait velocity: decr General Gait Details: cues for sequence and RW safety    Exercises Total Joint Exercises Ankle Circles/Pumps: AROM;Both;5 reps   PT Diagnosis:    PT Problem List:   PT Treatment Interventions:       PT Goals(Current goals can be found in the care plan section) Acute Rehab PT Goals Patient Stated Goal: I  PT Goal Formulation: With patient Time For Goal Achievement: 06/04/13 Potential to Achieve Goals: Good  Visit Information  Last PT Received On:  05/28/13 Assistance Needed: +1 History of Present Illness: s/p L TKA       Prior Functioning  Home Living Family/patient expects to be discharged to:: Skilled nursing facility Living Arrangements: Spouse/significant other Prior Function Level of Independence: Independent Communication Communication: No difficulties    Cognition  Cognition Arousal/Alertness: Awake/alert Behavior During Therapy: WFL for tasks assessed/performed Overall Cognitive Status: Within Functional Limits for tasks assessed    Extremity/Trunk Assessment Upper Extremity Assessment Upper Extremity Assessment:  (pt reports "bad shoulder on R") Lower Extremity Assessment Lower Extremity Assessment: LLE deficits/detail LLE Deficits / Details: assist with SLR, ankle WFL   Balance    End of Session PT - End of Session Equipment Utilized During Treatment: Gait belt;Left knee immobilizer Activity Tolerance: Patient tolerated treatment well Patient left: in chair;with call bell/phone within reach Nurse Communication: Mobility status CPM Left Knee CPM Left Knee: Off  GP     [facility] County Memorial HospitalWILLIAMS,Asher Babilonia 05/28/2013, 4:47 PM

## 2013-05-28 NOTE — Transfer of Care (Signed)
Immediate Anesthesia Transfer of Care Note  Patient: Vanessa HanlonLinda R Arrowood  Procedure(s) Performed: Procedure(s): LEFT TOTAL KNEE ARTHROPLASTY (Left)  Patient Location: PACU  Anesthesia Type:General  Level of Consciousness: awake, alert , oriented and patient cooperative  Airway & Oxygen Therapy: Patient Spontanous Breathing and Patient connected to face mask oxygen  Post-op Assessment: Report given to PACU RN and Post -op Vital signs reviewed and stable  Post vital signs: stable  Complications: No apparent anesthesia complications

## 2013-05-28 NOTE — Anesthesia Postprocedure Evaluation (Signed)
Anesthesia Post Note  Patient: Vanessa HanlonLinda R Hochman  Procedure(s) Performed: Procedure(s) (LRB): LEFT TOTAL KNEE ARTHROPLASTY (Left)  Anesthesia type: General  Patient location: PACU  Post pain: Pain level controlled  Post assessment: Post-op Vital signs reviewed  Last Vitals:  Filed Vitals:   05/28/13 1421  BP: 123/73  Pulse: 85  Temp: 36.9 C  Resp: 16    Post vital signs: Reviewed  Level of consciousness: sedated  Complications: No apparent anesthesia complications

## 2013-05-28 NOTE — H&P (View-Only) (Signed)
Vanessa Frank. Isley  DOB: July 31, 1945 Married / Language: English / Race: White Female  Date of Admission:  05-28-2913  Chief Complaint:  Left knee pain  History of Present Illness The patient is a 68 year old female who comes in for a preoperative History and Physical. The patient is scheduled for a left total knee arthroplasty to be performed by Dr. Gus Rankin. Aluisio, MD at [facility] on 05-28-2013. The patient is a 68 year old female who presents with knee complaints. The patient is seen in referral from Dr. Yetta Barre. The patient reports left knee and right knee symptoms including: pain, instability, giving way (feels like they are going to) and (popping) which began year(s) ago without any known injury. Prior to being seen today the patient was previously evaluated by a colleague (Dr. Yetta Barre) 2 month(s) ago (or 3). Note for "Knee pain": She has been seeing Dr. Yetta Barre for her back and has had recent ESIs by Dr. Murray Hodgkins. She said she has been seen by someone else for her knees, since she was here last. She had a series of Synvisc, which was completed by our PA, Leilani Able on 06/17/11. She has not been back since she had the third injection. She feels as though the knees have gotten a lot worse since she was seen a year and a half ago. The left knee is more symptomatic than the right but both are very problematic for her. They hurt at all times. They are limiting what she can and can not do. She is not really having pain at night.She is ready to proceed with knee replacement. They have been treated conservatively in the past for the above stated problem and despite conservative measures, they continue to have progressive pain and severe functional limitations and dysfunction. They have failed non-operative management including home exercise, medications, and injections. It is felt that they would benefit from undergoing total joint replacement. Risks and benefits of the procedure have been  discussed with the patient and they elect to proceed with surgery. There are no active contraindications to surgery such as ongoing infection or rapidly progressive neurological disease.  Allergies No Known Drug Allergies. 06/01/2011 sensitivity to codeine - Patient IS able to take oxycodone and hydrocodone  Problem List/Past MedicalHistory Osteoarthritis, Knee (715.96) Adhesive capsulitis (726.0) Right shoulder pain (719.41) Osteoarthritis High blood pressure Anxiety Disorder Chronic Pain Fibromyalgia Diabetes Mellitus, Type II Impaired Hearing Shingles Depression Varicose veins Gastroesophageal Reflux Disease Hemorrhoids Urinary Incontinence Rheumatoid Arthritis Osteoporosis Degenerative Disc Disease Menopause   Family History Depression. mother and grandmother mothers side mother, sister and grandmother mothers side Cancer. First Degree Relatives. mother and father Congestive Heart Failure. First Degree Relatives. mother Severe allergy. mother Heart Disease. mother Drug / Alcohol Addiction. mother Hypertension. mother Rheumatoid Arthritis. grandmother fathers side Osteoarthritis. mother, sister and grandmother mothers side mother, grandmother mothers side and grandmother fathers side Father. Deceased, Cancer. age 76 Mother. Deceased. age 40 Siblings. Deceased. Sister, age 57, Blood Clots    Social History Exercise. Exercises rarely; does other Drug/Alcohol Rehab (Currently). no Illicit drug use. no Marital status. married Living situation. live with spouse Alcohol use. current drinker; drinks wine; only occasionally per week Current work status. 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 yes Number of flights of stairs before winded. less than 1 Previously in rehab. no Tobacco use. Never smoker. never smoker Tobacco / smoke exposure. no Drug/Alcohol Rehab (Previously).  no Post-Surgical Plans. Skilled Rehab  Medication History Butalbital-ASA-Caff-Codeine (50-325-40-30MG  Capsule, Oral) Active. Actos (30MG  Tablet, Oral) Active. Oxycodone-Acetaminophen (5-325MG  Tablet, Oral) Active. Lipitor (10MG  Tablet, Oral) Active. Furosemide (40MG  Tablet, Oral) Active. Vitamin B-12 (1000MCG/ML Solution, Injection) Inactive. Lisinopril (10MG  Tablet, Oral) Active. MetFORMIN HCl (1000MG  Tablet, Oral) Active. Sertraline HCl (100MG  Tablet, Oral) Active. Metoclopramide HCl (10MG  Tablet, Oral) Active. NexIUM (20MG  Capsule DR, Oral) Active.  Past Surgical History Spinal Fusion. Date: 2012. lower back Hysterectomy. Date: 63. Partial Spinal Surgery Arthroscopy of Knee. Date: 2007. right bilateral Gallbladder Surgery. Date: 8. open Foot Surgery. right Bartholian Gland Cyst Removal. Date: 1983.   Review of Systems General:Present- Night Sweats, Fatigue and Memory Loss. Not Present- Chills, Fever, Weight Gain and Weight Loss. Skin:Not Present- Hives, Itching, Rash, Eczema and Lesions. HEENT:Present- Tinnitus, Headache and Hearing Loss. Not Present- Double Vision, Visual Loss and Dentures. Respiratory:Present- Shortness of breath with exertion, Cough and Wheezing. Not Present- Shortness of breath at rest, Allergies, Coughing up blood and Chronic Cough. Cardiovascular:Not Present- Chest Pain, Racing/skipping heartbeats, Difficulty Breathing Lying Down, Murmur, Swelling and Palpitations. Gastrointestinal:Present- Diarrhea. Not Present- Bloody Stool, Heartburn, Abdominal Pain, Vomiting, Nausea, Constipation, Difficulty Swallowing, Jaundice and Loss of appetitie. Female Genitourinary:Present- Incontinence and Urinating at Night. Not Present- Blood in Urine, Urinary frequency, Weak urinary stream, Discharge, Flank Pain, Painful Urination, Urgency and Urinary Retention. Musculoskeletal:Present- Joint Pain, Back Pain and Morning Stiffness. Not Present-  Muscle Weakness, Muscle Pain, Joint Swelling and Spasms. Neurological:Not Present- Tremor, Dizziness, Blackout spells, Paralysis, Difficulty with balance and Weakness. Psychiatric:Not Present- Insomnia.    Vitals Weight: 222 lb Height: 62 in Weight was reported by patient. Height was reported by patient. Body Surface Area: 2.1 m Body Mass Index: 40.6 kg/m Pulse: 64 (Regular) Resp.: 14 (Unlabored) BP: 128/70 (Sitting, Left Arm, Standard)     Physical Exam The physical exam findings are as follows:   General Mental Status - Alert, cooperative and good historian. General Appearance- pleasant. Not in acute distress. Orientation- Oriented X3. Build & Nutrition- Well nourished and Well developed.   Head and Neck Head- normocephalic, atraumatic . Neck Global Assessment- supple. no bruit auscultated on the right and no bruit auscultated on the left.   Eye Vision- Wears corrective lenses. Pupil- Bilateral- Regular and Round. Motion- Bilateral- EOMI.   Chest and Lung Exam Auscultation: Breath sounds:- clear at anterior chest wall and - clear at posterior chest wall. Adventitious sounds:- No Adventitious sounds.   Cardiovascular Auscultation:Rhythm- Regular rate and rhythm. Heart Sounds- S1 WNL and S2 WNL. Murmurs & Other Heart Sounds:Auscultation of the heart reveals - No Murmurs.   Abdomen Palpation/Percussion:Tenderness- Abdomen is non-tender to palpation. Rigidity (guarding)- Abdomen is soft. Auscultation:Auscultation of the abdomen reveals - Bowel sounds normal.   Female Genitourinary Not done, not pertinent to present illness  Musculoskeletal On exam she is alert and oriented in no apparent distress. Evaluation of her hips normal range of motion, no discomfort. Left knee no effusion. Slight varus deformity. Range 5 to 125. Moderate crepitus on range of motion. Tenderness medial greater than lateral with no  instability. Right knee no deformity, range 5 to 125, moderate crepitus on range of motion, tenderness medial greater than lateral, no instability noted.  RADIOGRAPHS: AP both knees and lateral show that she has bone on bone arthritis in the medial compartment and patellofemoral compartment of the left knee. Right knee shows just about bone on bone medial and patellofemoral.   Assessment & Plan Osteoarthritis, Knee (715.96) Impression: Left Knee  Note: Plan is for a Left Total Knee Replacement by  Dr. Lequita HaltAluisio.  Plan is to go to a rehab facility.  PCP - Dr. Theressa MillardJames Osborne - Patient has been seen preoperatively and felt to be stable for surgery.  The patient does not have any contraindications and will receive TXA (tranexamic acid) prior to surgery.  Signed electronically by Lauraine RinneAlexzandrew L Tresia Revolorio, [name]

## 2013-05-28 NOTE — Progress Notes (Signed)
Pt arrived to room 1616 from PACU on bed w/ CPM to LLE. Pt drowsy but arousable, Ox4. VSS, foley draining cl/y urine. Hemovac to lt knee w/ scant bloody drainage. NV checks WNL to BLE. Pt and husband oriented to callbell and environment. POC discussed.

## 2013-05-28 NOTE — Interval H&P Note (Signed)
History and Physical Interval Note:  05/28/2013 6:54 AM  Vanessa HanlonLinda R Frank  has presented today for surgery, with the diagnosis of Osteoarthritis of the Left Knee  The various methods of treatment have been discussed with the patient and family. After consideration of risks, benefits and other options for treatment, the patient has consented to  Procedure(s): LEFT TOTAL KNEE ARTHROPLASTY (Left) as a surgical intervention .  The patient's history has been reviewed, patient examined, no change in status, stable for surgery.  I have reviewed the patient's chart and labs.  Questions were answered to the patient's satisfaction.     Loanne DrillingALUISIO,Shayleen Eppinger V

## 2013-05-28 NOTE — Progress Notes (Signed)
Utilization review completed.  

## 2013-05-29 DIAGNOSIS — E871 Hypo-osmolality and hyponatremia: Secondary | ICD-10-CM | POA: Diagnosis present

## 2013-05-29 DIAGNOSIS — D62 Acute posthemorrhagic anemia: Secondary | ICD-10-CM | POA: Diagnosis not present

## 2013-05-29 LAB — BASIC METABOLIC PANEL
BUN: 21 mg/dL (ref 6–23)
CO2: 28 mEq/L (ref 19–32)
Calcium: 8.3 mg/dL — ABNORMAL LOW (ref 8.4–10.5)
Chloride: 94 mEq/L — ABNORMAL LOW (ref 96–112)
Creatinine, Ser: 1.51 mg/dL — ABNORMAL HIGH (ref 0.50–1.10)
GFR calc Af Amer: 40 mL/min — ABNORMAL LOW (ref >90)
GFR calc non Af Amer: 34 mL/min — ABNORMAL LOW (ref >90)
Glucose, Bld: 134 mg/dL — ABNORMAL HIGH (ref 70–99)
Potassium: 4.2 mEq/L (ref 3.7–5.3)
Sodium: 133 mEq/L — ABNORMAL LOW (ref 137–147)

## 2013-05-29 LAB — CBC
HCT: 29.6 % — ABNORMAL LOW (ref 36.0–46.0)
Hemoglobin: 9.8 g/dL — ABNORMAL LOW (ref 12.0–15.0)
MCH: 29.3 pg (ref 26.0–34.0)
MCHC: 33.1 g/dL (ref 30.0–36.0)
MCV: 88.4 fL (ref 78.0–100.0)
Platelets: 349 10*3/uL (ref 150–400)
RBC: 3.35 MIL/uL — ABNORMAL LOW (ref 3.87–5.11)
RDW: 13.9 % (ref 11.5–15.5)
WBC: 9.4 10*3/uL (ref 4.0–10.5)

## 2013-05-29 LAB — GLUCOSE, CAPILLARY
Glucose-Capillary: 145 mg/dL — ABNORMAL HIGH (ref 70–99)
Glucose-Capillary: 160 mg/dL — ABNORMAL HIGH (ref 70–99)
Glucose-Capillary: 201 mg/dL — ABNORMAL HIGH (ref 70–99)
Glucose-Capillary: 189 mg/dL — ABNORMAL HIGH (ref 70–99)

## 2013-05-29 MED ORDER — ESOMEPRAZOLE MAGNESIUM 20 MG PO CPDR
20.0000 mg | DELAYED_RELEASE_CAPSULE | Freq: Every day | ORAL | Status: DC
  Administered 2013-05-29 – 2013-05-31 (×3): 20 mg via ORAL
  Filled 2013-05-29 (×3): qty 1

## 2013-05-29 NOTE — Progress Notes (Signed)
Clinical Social Work Department CLINICAL SOCIAL WORK PLACEMENT NOTE 05/29/2013  Patient:  Vanessa Frank,[name]  Account Number:  [id number] Admit date:  05/28/2013  Clinical Social Worker:  Cori RazorJAMIE Ziggy Chanthavong, LCSW  Date/time:  05/29/2013 11:58 AM  Clinical Social Work is seeking post-discharge placement for this patient at the following level of care:   SKILLED NURSING   (*CSW will update this form in Epic as items are completed)   05/29/2013  [facility]/family provided with [facility] of Clinical [facility] Work's list of facilities offering this level of care within the geographic area requested by the patient (or if unable, by the patient's family).  05/29/2013  Patient/family informed of their freedom to choose among providers that offer the needed level of care, that participate in Medicare, Medicaid or managed care program needed by the patient, have an available bed and are willing to accept the patient.    Patient/family informed of [facility]' ownership interest in [facility], as well as of the fact that they are under no obligation to receive care at this facility.  PASARR submitted to EDS on 05/29/2013 PASARR number received from EDS on 05/29/2013  FL2 transmitted to all facilities in geographic area requested by pt/family on  05/29/2013 FL2 transmitted to all facilities within larger geographic area on   Patient informed that his/her managed care company has contracts with or will negotiate with  certain facilities, including the following:     Patient/family informed of bed offers received:  05/29/2013 [facility] Physician recommends and patient chooses bed at    [facility] on   Patient to be transferred to facility by   The following physician request were entered in Epic:   Additional Comments:   Cori RazorJamie Marilena Trevathan LCSW [phone]

## 2013-05-29 NOTE — Progress Notes (Signed)
Clinical Social Work Department BRIEF PSYCHOSOCIAL ASSESSMENT 05/29/2013  Patient:  Vanessa Frank, Vanessa Frank     Account Number:  [id number]     Admit date:  05/28/2013  Clinical Social Worker:  Lacie Scotts  Date/Time:  05/29/2013 11:51 AM  Referred by:  Physician  Date Referred:  05/29/2013 Referred for  SNF Placement   Other Referral:   Interview type:  Patient Other interview type:    PSYCHOSOCIAL DATA Living Status:  HUSBAND Admitted from facility:   Level of care:   Primary support name:  Boris Sharper Primary support relationship to patient:  SPOUSE Degree of support available:   supportive    CURRENT CONCERNS Current Concerns  Post-Acute Placement   Other Concerns:    SOCIAL WORK ASSESSMENT / PLAN Pt is a 68 yr old female living at home prior to hospitalization. CSW met with pt to assist with d/c planning. ST Rehab is needed following hospital d/c. SNF search initiated and bed offers provided. Pt has chosen [facility] for [employer]. CSW will continue to follow to assist with d/c planning to SNF.   Assessment/plan status:  Psychosocial Support/Ongoing Assessment of Needs Other assessment/ plan:   Information/referral to community resources:   SNF list provided. Insurance coverage for SNF reviewed.    PATIENT'S/FAMILY'S RESPONSE TO PLAN OF CARE: " My MD recommended [facility]. That's where I'd like to have my rehab. "  Pt's affect is bright. She is looking forward to having rehab at [facility].    Werner Lean LCSW [phone]

## 2013-05-29 NOTE — Progress Notes (Signed)
Physical Therapy Treatment Patient Details Name: Nolberto HanlonLinda R Clute MRN: [id number] DOB: 1945/10/21 Today's Date: 05/29/2013 Time: [id number] PT Time Calculation (min): 26 min  PT Assessment / Plan / Recommendation  History of Present Illness s/p L TKA   PT Comments   Pt ambulating well.   Follow Up Recommendations  SNF     Does the patient have the potential to tolerate intense rehabilitation     Barriers to Discharge        Equipment Recommendations  None recommended by PT    Recommendations for Other Services    Frequency 7X/week   Progress towards PT Goals Progress towards PT goals: Progressing toward goals  Plan Current plan remains appropriate    Precautions / Restrictions Precautions Precautions: Knee Required Braces or Orthoses: Knee Immobilizer - Left   Pertinent Vitals/Pain 7, requested muscle relaxer    Mobility  Bed Mobility Bed Mobility: Sit to Supine Sit to supine: Min assist General bed mobility comments: cues for technique and to self assist, support of LLE onto bed Transfers Overall transfer level: Needs assistance Equipment used: Rolling walker (2 wheeled) Transfers: Sit to/from Stand Sit to Stand: Min guard General transfer comment: cues for hand placement, LE management,  and  overall safe technique  Ambulation/Gait Ambulation/Gait assistance: Min guard Ambulation Distance (Feet): 65 Feet (then 20') Assistive device: Rolling walker (2 wheeled) Gait Pattern/deviations: Step-to pattern;Antalgic Gait velocity: decr General Gait Details: cues for sequence and RW safety    Exercises     PT Diagnosis:    PT Problem List:   PT Treatment Interventions:     PT Goals (current goals can now be found in the care plan section)    Visit Information  Last PT Received On: 05/29/13 Assistance Needed: +1 History of Present Illness: s/p L TKA    Subjective Data      Cognition  Cognition Arousal/Alertness: Awake/alert    Balance     End of  Session PT - End of Session Equipment Utilized During Treatment: Left knee immobilizer Activity Tolerance: Patient tolerated treatment well Patient left: in bed;with call bell/phone within reach Nurse Communication: Mobility status CPM Left Knee CPM Left Knee: On   GP     Rada HayHill, Tatelyn Vanhecke Elizabeth 05/29/2013, 3:13 PM

## 2013-05-29 NOTE — Progress Notes (Signed)
   Subjective: 1 Day Post-Op Procedure(s) (LRB): LEFT TOTAL KNEE ARTHROPLASTY (Left) Patient reports pain as mild with her back. Patient seen in rounds with Dr. Lequita HaltAluisio. Patient is well, but has had some minor complaints of pain in the back, requiring pain medications We will start therapy today.  Plan is to go Rehab after hospital stay.  Objective: Vital signs in last 24 hours: Temp:  [97.9 F (36.6 C)-98.8 F (37.1 C)] 98.3 F (36.8 C) (02/10 0557) Pulse Rate:  [66-93] 66 (02/10 0557) Resp:  [8-18] 17 (02/10 0557) BP: (109-171)/(59-82) 118/71 mmHg (02/10 0557) SpO2:  [91 %-100 %] 98 % (02/10 0557) Weight:  [100.9 kg (222 lb 7.1 oz)] 100.9 kg (222 lb 7.1 oz) (02/09 1043)  Intake/Output from previous day:  Intake/Output Summary (Last 24 hours) at 05/29/13 0759 Last data filed at 05/29/13 0558  Gross per 24 hour  Intake   2350 ml  Output   2440 ml  Net    -90 ml    Intake/Output this shift:    Labs:  Recent Labs  05/29/13 0434  HGB 9.8*    Recent Labs  05/29/13 0434  WBC 9.4  RBC 3.35*  HCT 29.6*  PLT 349    Recent Labs  05/29/13 0434  NA 133*  K 4.2  CL 94*  CO2 28  BUN 21  CREATININE 1.51*  GLUCOSE 134*  CALCIUM 8.3*   No results found for this basename: LABPT, INR,  in the last 72 hours  EXAM General - Patient is Alert, Appropriate and Oriented Extremity - Neurovascular intact Sensation intact distally Dorsiflexion/Plantar flexion intact Dressing - dressing C/D/I Motor Function - intact, moving foot and toes well on exam.  Hemovac pulled without difficulty.  Past Medical History  Diagnosis Date  . Hypertension   . Dysrhythmia IRREGULAR  RATE IN HER 20'S  . Diabetes mellitus ORAL MEDS ONLY  . GERD (gastroesophageal reflux disease)   . Headache(784.0)   . Arthritis   . Anxiety   . Complication of anesthesia   . Shortness of breath WITH EXERTION  . Fibromyalgia   . Neuropathy     TOES  . Stress incontinence      Assessment/Plan: 1 Day Post-Op Procedure(s) (LRB): LEFT TOTAL KNEE ARTHROPLASTY (Left) Principal Problem:   OA (osteoarthritis) of knee Active Problems:   Postoperative anemia due to acute blood loss   Hyponatremia  Estimated body mass index is 40.68 kg/(m^2) as calculated from the following:   Height as of this encounter: 5\' 2"  (1.575 m).   Weight as of this encounter: 100.9 kg (222 lb 7.1 oz). Advance diet Up with therapy Discharge to SNF  DVT Prophylaxis - Xarelto Weight-Bearing as tolerated to left leg D/C O2 and Pulse OX and try on Room Air  Kiren Mcisaac 05/29/2013, 7:59 AM

## 2013-05-29 NOTE — Discharge Instructions (Addendum)
° °Dr. Frank Aluisio °Total Joint Specialist °[facility] °[address] °[place], [place] [address] °[phone] ° °TOTAL KNEE REPLACEMENT POSTOPERATIVE DIRECTIONS ° ° ° °Knee Rehabilitation, Guidelines Following Surgery  °Results after knee surgery are often greatly improved when you follow the exercise, range of motion and muscle strengthening exercises prescribed by your doctor. Safety measures are also important to protect the knee from further injury. Any time any of these exercises cause you to have increased pain or swelling in your knee joint, decrease the amount until you are comfortable again and slowly increase them. If you have problems or questions, call your caregiver or physical therapist for advice.  ° °HOME CARE INSTRUCTIONS  °Remove items at home which could result in a fall. This includes throw rugs or furniture in walking pathways.  °Continue medications as instructed at time of discharge. °You may have some home medications which will be placed on hold until you complete the course of blood thinner medication.  °You may start showering once you are discharged home but do not submerge the incision under water. Just pat the incision dry and apply a dry gauze dressing on daily. °Walk with walker as instructed.  °You may resume a sexual relationship in one month or when given the OK by  your doctor.  °· Use walker as long as suggested by your caregivers. °· Avoid periods of inactivity such as sitting longer than an hour when not asleep. This helps prevent blood clots.  °You may put full weight on your legs and walk as much as is comfortable.  °You may return to work once you are cleared by your doctor.  °Do not drive a car for 6 weeks or until released by you surgeon.  °· Do not drive while taking narcotics.  °Wear the elastic stockings for three weeks following surgery during the day but you may remove then at night. °Make sure you keep all of your appointments after your  operation with all of your doctors and caregivers. You should call the office at the above phone number and make an appointment for approximately two weeks after the date of your surgery. °Change the dressing daily and reapply a dry dressing each time. °Please pick up a stool softener and laxative for home use as long as you are requiring pain medications. °· Continue to use ice on the knee for pain and swelling from surgery. You may notice swelling that will progress down to the foot and ankle.  This is normal after surgery.  Elevate the leg when you are not up walking on it.   °It is important for you to complete the blood thinner medication as prescribed by your doctor. °· Continue to use the breathing machine which will help keep your temperature down.  It is common for your temperature to cycle up and down following surgery, especially at night when you are not up moving around and exerting yourself.  The breathing machine keeps your lungs expanded and your temperature down. ° °RANGE OF MOTION AND STRENGTHENING EXERCISES  °Rehabilitation of the knee is important following a knee injury or an operation. After just a few days of immobilization, the muscles of the thigh which control the knee become weakened and shrink (atrophy). Knee exercises are designed to build up the tone and strength of the thigh muscles and to improve knee motion. Often times heat used for twenty to thirty minutes before working out will loosen up your tissues and help with improving the   range of motion but do not use heat for the first two weeks following surgery. These exercises can be done on a training (exercise) mat, on the floor, on a table or on a bed. Use what ever works the best and is most comfortable for you Knee exercises include:  Leg Lifts - While your knee is still immobilized in a splint or cast, you can do straight leg raises. Lift the leg to 60 degrees, hold for 3 sec, and slowly lower the leg. Repeat 10-20 times 2-3  times daily. Perform this exercise against resistance later as your knee gets better.  Quad and Hamstring Sets - Tighten up the muscle on the front of the thigh (Quad) and hold for 5-10 sec. Repeat this 10-20 times hourly. Hamstring sets are done by pushing the foot backward against an object and holding for 5-10 sec. Repeat as with quad sets.  A rehabilitation program following serious knee injuries can speed recovery and prevent re-injury in the future due to weakened muscles. Contact your doctor or a physical therapist for more information on knee rehabilitation.   SKILLED REHAB INSTRUCTIONS: If the patient is transferred to a skilled rehab facility following release from the hospital, a list of the current medications will be sent to the facility for the patient to continue.  When discharged from the skilled rehab facility, please have the facility set up the patient's Home Health Physical Therapy prior to being released. Also, the skilled facility will be responsible for providing the patient with their medications at time of release from the facility to include their pain medication, the muscle relaxants, and their blood thinner medication. If the patient is still at the rehab facility at time of the two week follow up appointment, the skilled rehab facility will also need to assist the patient in arranging follow up appointment in our office and any transportation needs.  MAKE SURE YOU:  Understand these instructions.  Will watch your condition.  Will get help right away if you are not doing well or get worse.    Pick up stool softner and laxative for home. Do not submerge incision under water. May shower. Continue to use ice for pain and swelling from surgery.  Take Xarelto for two and a half more weeks, then discontinue Xarelto. Once the patient has completed the blood thinner regimen, then take a Baby 81 mg Aspirin daily for four more weeks.  When discharged from the skilled rehab  facility, please have the facility set up the patient's Home Health Physical Therapy prior to being released.  Also provide the patient with their medications at time of release from the facility to include their pain medication, the muscle relaxants, and their blood thinner medication.  If the patient is still at the rehab facility at time of follow up appointment, please also assist the patient in arranging follow up appointment in our office and any transportation needs.     Information on my medicine - XARELTO (Rivaroxaban)  This medication education was reviewed with me or my healthcare representative as part of my discharge preparation.  The pharmacist that spoke with me during my hospital stay was:  Borgerding, Loma MessingMary Patricia, [facility]  Why was Xarelto prescribed for you? Xarelto was prescribed for you to reduce the risk of blood clots forming after orthopedic surgery. The medical term for these abnormal blood clots is venous thromboembolism (VTE).  What do you need to know about xarelto ? Take your Xarelto ONCE DAILY at the same time  every day. You may take it either with or without food.  If you have difficulty swallowing the tablet whole, you may crush it and mix in applesauce just prior to taking your dose.  Take Xarelto exactly as prescribed by your doctor and DO NOT stop taking Xarelto without talking to the doctor who prescribed the medication.  Stopping without other VTE prevention medication to take the place of Xarelto may increase your risk of developing a clot.  After discharge, you should have regular check-up appointments with your healthcare provider that is prescribing your Xarelto.    What do you do if you miss a dose? If you miss a dose, take it as soon as you remember on the same day then continue your regularly scheduled once daily regimen the next day. Do not take two doses of Xarelto on the same day.   Important Safety Information A possible side effect of  Xarelto is bleeding. You should call your healthcare provider right away if you experience any of the following:   Bleeding from an injury or your nose that does not stop.   Unusual colored urine (red or dark brown) or unusual colored stools (red or black).   Unusual bruising for unknown reasons.   A serious fall or if you hit your head (even if there is no bleeding).  Some medicines may interact with Xarelto and might increase your risk of bleeding while on Xarelto. To help avoid this, consult your healthcare provider or pharmacist prior to using any new prescription or non-prescription medications, including herbals, vitamins, non-steroidal anti-inflammatory drugs (NSAIDs) and supplements.  This website has more information on Xarelto: [email or web address]

## 2013-05-29 NOTE — Progress Notes (Signed)
OT Cancellation Note  Patient Details Name: Vanessa HanlonLinda R Auriemma MRN: [id number] DOB: 13-Mar-1946   Cancelled Treatment:    Reason Eval/Treat Not Completed: Other (comment) Note pt has Medicare and planning SNF. OT eval not needed for skilled. Please notify if OT eval is needed for rehab stay.  Lennox LaityStone, Daiel Strohecker Stafford [phone] 05/29/2013, 11:43 AM

## 2013-05-29 NOTE — Progress Notes (Signed)
Physical Therapy Treatment Patient Details Name: Vanessa HanlonLinda R Frank MRN: [id number] DOB: 09/02/1945 Today's Date: 05/29/2013 Time: [id number] PT Time Calculation (min): 25 min  PT Assessment / Plan / Recommendation  History of Present Illness s/p L TKA   PT Comments   Pt progressing, plan is for SNF  Follow Up Recommendations  SNF     Does the patient have the potential to tolerate intense rehabilitation     Barriers to Discharge        Equipment Recommendations  None recommended by PT    Recommendations for Other Services    Frequency 7X/week   Progress towards PT Goals Progress towards PT goals: Progressing toward goals  Plan Current plan remains appropriate    Precautions / Restrictions Precautions Precautions: Knee Required Braces or Orthoses: Knee Immobilizer - Left Restrictions Other Position/Activity Restrictions: WBAT   Pertinent Vitals/Pain Pain controlled, was premedicated    Mobility  Transfers Overall transfer level: Needs assistance Equipment used: Rolling walker (2 wheeled) Transfers: Sit to/from Stand Sit to Stand: Min guard General transfer comment: cues for hand placement, LE management,  and  overall safe technique  Ambulation/Gait Ambulation/Gait assistance: Min guard Ambulation Distance (Feet): 88 Feet Assistive device: Rolling walker (2 wheeled) Gait Pattern/deviations: Step-to pattern Gait velocity: decr General Gait Details: cues for sequence and RW safety    Exercises Total Joint Exercises Ankle Circles/Pumps: AROM;Both;10 reps Quad Sets: AROM;Both;10 reps Short Arc Quad: AROM;10 reps;Left Heel Slides: AAROM;Left;10 reps Straight Leg Raises: AAROM;Left;10 reps   PT Diagnosis:    PT Problem List:   PT Treatment Interventions:     PT Goals (current goals can now be found in the care plan section) Acute Rehab PT Goals Patient Stated Goal: I  Time For Goal Achievement: 06/04/13 Potential to Achieve Goals: Good  Visit Information  Last PT Received On: 05/29/13 Assistance Needed: +1 History of Present Illness: s/p L TKA    Subjective Data  Patient Stated Goal: I    Cognition  Cognition Arousal/Alertness: Awake/alert Behavior During Therapy: WFL for tasks assessed/performed Overall Cognitive Status: Within Functional Limits for tasks assessed    Balance     End of Session PT - End of Session Equipment Utilized During Treatment: Gait belt;Left knee immobilizer Activity Tolerance: Patient tolerated treatment well Patient left: in chair;with call bell/phone within reach Nurse Communication: Mobility status   GP     Vanessa Frank,Vanessa Frank 05/29/2013, 11:54 AM

## 2013-05-30 LAB — GLUCOSE, CAPILLARY
Glucose-Capillary: 132 mg/dL — ABNORMAL HIGH (ref 70–99)
Glucose-Capillary: 171 mg/dL — ABNORMAL HIGH (ref 70–99)
Glucose-Capillary: 113 mg/dL — ABNORMAL HIGH (ref 70–99)
Glucose-Capillary: 122 mg/dL — ABNORMAL HIGH (ref 70–99)

## 2013-05-30 LAB — BASIC METABOLIC PANEL
BUN: 22 mg/dL (ref 6–23)
CO2: 28 mEq/L (ref 19–32)
Calcium: 8.7 mg/dL (ref 8.4–10.5)
Chloride: 98 mEq/L (ref 96–112)
Creatinine, Ser: 1.14 mg/dL — ABNORMAL HIGH (ref 0.50–1.10)
GFR calc Af Amer: 56 mL/min — ABNORMAL LOW (ref >90)
GFR calc non Af Amer: 48 mL/min — ABNORMAL LOW (ref >90)
Glucose, Bld: 157 mg/dL — ABNORMAL HIGH (ref 70–99)
Potassium: 4.3 mEq/L (ref 3.7–5.3)
Sodium: 136 mEq/L — ABNORMAL LOW (ref 137–147)

## 2013-05-30 LAB — CBC
HCT: 29.5 % — ABNORMAL LOW (ref 36.0–46.0)
Hemoglobin: 9.7 g/dL — ABNORMAL LOW (ref 12.0–15.0)
MCH: 29.1 pg (ref 26.0–34.0)
MCHC: 32.9 g/dL (ref 30.0–36.0)
MCV: 88.6 fL (ref 78.0–100.0)
Platelets: 353 10*3/uL (ref 150–400)
RBC: 3.33 MIL/uL — ABNORMAL LOW (ref 3.87–5.11)
RDW: 13.9 % (ref 11.5–15.5)
WBC: 8.5 10*3/uL (ref 4.0–10.5)

## 2013-05-30 NOTE — Care Management Note (Signed)
    Page 1 of 1   05/30/2013     5:31:22 PM   CARE MANAGEMENT NOTE 05/30/2013  Patient:  Vanessa Frank,Vanessa Frank   Account Number:  [id number]  Date Initiated:  05/30/2013  Documentation initiated by:  Colleen CanMANNING,Chandlar  Subjective/Objective Assessment:   d xleft knee replacemnt     Action/Plan:   SNF rehab   Anticipated DC Date:  05/31/2013   Anticipated DC Plan:  SKILLED NURSING FACILITY  In-house referral  Clinical Social Worker      DC Planning Services  CM consult      Choice offered to / List presented to:             Status of service:  Completed, signed off Medicare Important Message given?  NA - LOS <3 / Initial given by admissions (If response is "NO", the following Medicare IM given date fields will be blank) Date Medicare IM given:   Date Additional Medicare IM given:    Discharge Disposition:    Per UR Regulation:    If discussed at Long Length of Stay Meetings, dates discussed:    Comments:

## 2013-05-30 NOTE — Progress Notes (Signed)
   Subjective: 2 Days Post-Op Procedure(s) (LRB): LEFT TOTAL KNEE ARTHROPLASTY (Left) Patient reports pain as mild.   Patient seen in rounds for Dr. Lequita HaltAluisio.  Sitting up in chair. Patient is well, but has had some minor complaints of pain in the knee, requiring pain medications Plan is to go Skilled nursing facility after hospital stay.  Objective: Vital signs in last 24 hours: Temp:  [98 F (36.7 C)-98.4 F (36.9 C)] 98.3 F (36.8 C) (02/11 1427) Pulse Rate:  [82-96] 82 (02/11 1427) Resp:  [16] 16 (02/11 1427) BP: (128-151)/(76-85) 137/82 mmHg (02/11 1427) SpO2:  [94 %-95 %] 95 % (02/11 1427)  Intake/Output from previous day:  Intake/Output Summary (Last 24 hours) at 05/30/13 1731 Last data filed at 05/30/13 1301  Gross per 24 hour  Intake    720 ml  Output      0 ml  Net    720 ml    Intake/Output this shift: Total I/O In: 480 [P.O.:480] Out: -   Labs:  Recent Labs  05/29/13 0434 05/30/13 0430  HGB 9.8* 9.7*    Recent Labs  05/29/13 0434 05/30/13 0430  WBC 9.4 8.5  RBC 3.35* 3.33*  HCT 29.6* 29.5*  PLT 349 353    Recent Labs  05/29/13 0434 05/30/13 0430  NA 133* 136*  K 4.2 4.3  CL 94* 98  CO2 28 28  BUN 21 22  CREATININE 1.51* 1.14*  GLUCOSE 134* 157*  CALCIUM 8.3* 8.7   No results found for this basename: LABPT, INR,  in the last 72 hours  EXAM General - Patient is Alert, Appropriate and Oriented Extremity - Neurovascular intact Sensation intact distally Dorsiflexion/Plantar flexion intact Dressing/Incision - clean, dry, no drainage, healing Motor Function - intact, moving foot and toes well on exam.   Past Medical History  Diagnosis Date  . Hypertension   . Dysrhythmia IRREGULAR  RATE IN HER 20'S  . Diabetes mellitus ORAL MEDS ONLY  . GERD (gastroesophageal reflux disease)   . Headache(784.0)   . Arthritis   . Anxiety   . Complication of anesthesia   . Shortness of breath WITH EXERTION  . Fibromyalgia   . Neuropathy    TOES  . Stress incontinence     Assessment/Plan: 2 Days Post-Op Procedure(s) (LRB): LEFT TOTAL KNEE ARTHROPLASTY (Left) Principal Problem:   OA (osteoarthritis) of knee Active Problems:   Postoperative anemia due to acute blood loss   Hyponatremia  Estimated body mass index is 40.68 kg/(m^2) as calculated from the following:   Height as of this encounter: 5\' 2"  (1.575 m).   Weight as of this encounter: 100.9 kg (222 lb 7.1 oz). Up with therapy Plan for discharge tomorrow Discharge to SNF  DVT Prophylaxis - Xarelto Weight-Bearing as tolerated to left leg  Vanessa Frank 05/30/2013, 5:31 PM

## 2013-05-30 NOTE — Progress Notes (Signed)
Physical Therapy Treatment Patient Details Name: Vanessa HanlonLinda R Frank MRN: [id number] DOB: 01-31-1946 Today's Date: 05/30/2013 Time: 0950-1020 PT Time Calculation (min): 30 min  PT Assessment / Plan / Recommendation  History of Present Illness s/p L TKA   PT Comments   Pt is progressing nicely. Pt is ambulating without KI  Follow Up Recommendations  SNF     Does the patient have the potential to tolerate intense rehabilitation     Barriers to Discharge        Equipment Recommendations  None recommended by PT    Recommendations for Other Services    Frequency 7X/week   Progress towards PT Goals Progress towards PT goals: Progressing toward goals  Plan Current plan remains appropriate    Precautions / Restrictions Precautions Precautions: Knee Required Braces or Orthoses:  (dicontinued KI) Restrictions Weight Bearing Restrictions: No   Pertinent Vitals/Pain < 4 , ice applied.    Mobility  Bed Mobility Bed Mobility: Supine to Sit Supine to sit: Min assist General bed mobility comments: cues for technique and to self assist, support of LLE  Transfers Overall transfer level: Needs assistance Equipment used: Rolling walker (2 wheeled) Transfers: Sit to/from Stand Sit to Stand: From elevated surface;Supervision General transfer comment: cues for hand placement, LE management,  and  overall safe technique  Ambulation/Gait Ambulation/Gait assistance: Min guard Ambulation Distance (Feet): 125 Feet Gait Pattern/deviations: Step-to pattern;Antalgic General Gait Details: cues for sequence and RW safety    Exercises Total Joint Exercises Quad Sets: AROM;Both;10 reps Hip ABduction/ADduction: AAROM;Left;10 reps;Seated Straight Leg Raises: AAROM;Left;10 reps;Seated Knee Flexion: AROM;Left;10 reps;Seated Goniometric ROM: 10-560 *   PT Diagnosis:    PT Problem List:   PT Treatment Interventions:     PT Goals (current goals can now be found in the care plan section)    Visit  Information  Last PT Received On: 05/30/13 Assistance Needed: +1 History of Present Illness: s/p L TKA    Subjective Data      Cognition  Cognition Arousal/Alertness: Awake/alert    Balance     End of Session PT - End of Session Activity Tolerance: Patient tolerated treatment well Patient left: in chair;with call bell/phone within reach CPM Left Knee CPM Left Knee: Off   GP     Rada HayHill, Emerald Shor Elizabeth 05/30/2013, 11:21 AM Blanchard KelchKaren Kennedie Pardoe PT [phone]

## 2013-05-31 ENCOUNTER — Other Ambulatory Visit: Payer: Self-pay | Admitting: *Deleted

## 2013-05-31 LAB — CBC
HCT: 29.9 % — ABNORMAL LOW (ref 36.0–46.0)
Hemoglobin: 9.7 g/dL — ABNORMAL LOW (ref 12.0–15.0)
MCH: 29 pg (ref 26.0–34.0)
MCHC: 32.4 g/dL (ref 30.0–36.0)
MCV: 89.5 fL (ref 78.0–100.0)
Platelets: 350 10*3/uL (ref 150–400)
RBC: 3.34 MIL/uL — ABNORMAL LOW (ref 3.87–5.11)
RDW: 14.2 % (ref 11.5–15.5)
WBC: 8.1 10*3/uL (ref 4.0–10.5)

## 2013-05-31 LAB — GLUCOSE, CAPILLARY: Glucose-Capillary: 147 mg/dL — ABNORMAL HIGH (ref 70–99)

## 2013-05-31 MED ORDER — BISACODYL 10 MG RE SUPP
10.0000 mg | Freq: Once | RECTAL | Status: AC
  Administered 2013-05-31: 10 mg via RECTAL
  Filled 2013-05-31: qty 1

## 2013-05-31 MED ORDER — TRAMADOL HCL 50 MG PO TABS: 50.0000 mg | ORAL_TABLET | Freq: Four times a day (QID) | ORAL | Status: DC | PRN

## 2013-05-31 MED ORDER — RIVAROXABAN 10 MG PO TABS: 10.0000 mg | ORAL_TABLET | Freq: Every day | ORAL | Status: DC

## 2013-05-31 MED ORDER — OXYCODONE HCL 5 MG PO TABS: 5.0000 mg | ORAL_TABLET | ORAL | Status: DC | PRN

## 2013-05-31 MED ORDER — DSS 100 MG PO CAPS: 100.0000 mg | ORAL_CAPSULE | Freq: Two times a day (BID) | ORAL | Status: DC

## 2013-05-31 MED ORDER — ACETAMINOPHEN 325 MG PO TABS: 650.0000 mg | ORAL_TABLET | Freq: Four times a day (QID) | ORAL | Status: DC | PRN

## 2013-05-31 MED ORDER — METHOCARBAMOL 500 MG PO TABS: 500.0000 mg | ORAL_TABLET | Freq: Four times a day (QID) | ORAL | Status: DC | PRN

## 2013-05-31 MED ORDER — BISACODYL 10 MG RE SUPP: 10.0000 mg | Freq: Every day | RECTAL | Status: DC | PRN

## 2013-05-31 MED ORDER — ONDANSETRON HCL 4 MG PO TABS: 4.0000 mg | ORAL_TABLET | Freq: Four times a day (QID) | ORAL | Status: DC | PRN

## 2013-05-31 MED ORDER — OXYCODONE HCL 5 MG PO TABS: ORAL_TABLET | ORAL | Status: DC

## 2013-05-31 MED ORDER — TRAMADOL HCL 50 MG PO TABS: ORAL_TABLET | ORAL | Status: DC

## 2013-05-31 NOTE — Progress Notes (Signed)
Physical Therapy Treatment Patient Details Name: Vanessa HanlonLinda R Frank MRN: [id number] DOB: [birth year] Today's Date: 05/31/2013 Time: [id number] PT Time Calculation (min): 15 min  PT Assessment / Plan / Recommendation  History of Present Illness s/p L TKA   PT Comments   POD # 3 assisted pt OOB to amb in hallway then to BR.  Pt requested to stay in bathroom for quite some time to try to move bowels.  Pt plans to D/c to rehab today.   Follow Up Recommendations  SNF     Does the patient have the potential to tolerate intense rehabilitation     Barriers to Discharge        Equipment Recommendations       Recommendations for Other Services    Frequency     Progress towards PT Goals Progress towards PT goals: Progressing toward goals  Plan      Precautions / Restrictions Precautions Precautions: Knee Restrictions Weight Bearing Restrictions: No Other Position/Activity Restrictions: WBAT   Pertinent Vitals/Pain C/o 3/10 pain Pre medicated    Mobility  Bed Mobility Overal bed mobility: Needs Assistance Bed Mobility: Supine to Sit Supine to sit: Min assist General bed mobility comments: cues for technique and to self assist, support of LLE  Transfers Overall transfer level: Needs assistance Equipment used: Rolling walker (2 wheeled) Transfers: Sit to/from Stand Sit to Stand: From elevated surface;Supervision General transfer comment: cues for hand placement, LE management,  and  overall safe technique  Ambulation/Gait Ambulation/Gait assistance: Min guard Ambulation Distance (Feet): 115 Feet Assistive device: Rolling walker (2 wheeled) Gait Pattern/deviations: Step-to pattern;Decreased stance time - left Gait velocity: decr General Gait Details: cues for sequence and RW safety     PT Goals (current goals can now be found in the care plan section)    Visit Information  Last PT Received On: 05/31/13 Assistance Needed: +1 History of Present Illness: s/p L TKA     Subjective Data      Cognition       Balance     End of Session PT - End of Session Equipment Utilized During Treatment: Gait belt Activity Tolerance: Patient tolerated treatment well Patient left: Other (comment) (in bathroom per pt request) Nurse Communication: Mobility status   Felecia ShellingLori Vance Hochmuth  PTA [facility]  Acute  Rehab Pager      [phone]

## 2013-05-31 NOTE — Discharge Summary (Signed)
Physician Discharge Summary   Patient ID: Vanessa Frank MRN: [id number] DOB/AGE: 1945/07/19 68 y.o.  Admit date: 05/28/2013 Discharge date: 05-31-2013  Primary Diagnosis:  Osteoarthritis Left knee(s)  Admission Diagnoses:  Past Medical History  Diagnosis Date  . Hypertension   . Dysrhythmia IRREGULAR  RATE IN HER 20'S  . Diabetes mellitus ORAL MEDS ONLY  . GERD (gastroesophageal reflux disease)   . Headache(784.0)   . Arthritis   . Anxiety   . Complication of anesthesia   . Shortness of breath WITH EXERTION  . Fibromyalgia   . Neuropathy     TOES  . Stress incontinence    Discharge Diagnoses:   Principal Problem:   OA (osteoarthritis) of knee Active Problems:   Postoperative anemia due to acute blood loss   Hyponatremia  Estimated body mass index is 40.68 kg/(m^2) as calculated from the following:   Height as of this encounter: 5' 2"  (1.575 m).   Weight as of this encounter: 100.9 kg (222 lb 7.1 oz).  Procedure:  Procedure(s) (LRB): LEFT TOTAL KNEE ARTHROPLASTY (Left)   Consults: None  HPI: Vanessa Frank is a 68 y.o. year old female with end stage OA of her left knee with progressively worsening pain and dysfunction. She has constant pain, with activity and at rest and significant functional deficits with difficulties even with ADLs. She has had extensive non-op management including analgesics, injections of cortisone and viscosupplements, and home exercise program, but remains in significant pain with significant dysfunction. Radiographs show bone on bone arthritis medial and patellofemoral. She presents now for left Total Knee Arthroplasty.   Laboratory Data: Admission on 05/28/2013  Component Date Value Ref Range Status  . Glucose-Capillary 05/28/2013 136* 70 - 99 mg/dL Final  . Glucose-Capillary 05/28/2013 171* 70 - 99 mg/dL Final  . Comment 1 05/28/2013 Documented in Chart   Final  . Glucose-Capillary 05/28/2013 188* 70 - 99 mg/dL Final  . Comment 1 05/28/2013  Documented in Chart   Final  . Comment 2 05/28/2013 Notify RN   Final  . Glucose-Capillary 05/28/2013 162* 70 - 99 mg/dL Final  . WBC 05/29/2013 9.4  4.0 - 10.5 K/uL Final  . RBC 05/29/2013 3.35* 3.87 - 5.11 MIL/uL Final  . Hemoglobin 05/29/2013 9.8* 12.0 - 15.0 g/dL Final  . HCT 05/29/2013 29.6* 36.0 - 46.0 % Final  . MCV 05/29/2013 88.4  78.0 - 100.0 fL Final  . MCH 05/29/2013 29.3  26.0 - 34.0 pg Final  . MCHC 05/29/2013 33.1  30.0 - 36.0 g/dL Final  . RDW 05/29/2013 13.9  11.5 - 15.5 % Final  . Platelets 05/29/2013 349  150 - 400 K/uL Final  . Sodium 05/29/2013 133* 137 - 147 mEq/L Final  . Potassium 05/29/2013 4.2  3.7 - 5.3 mEq/L Final  . Chloride 05/29/2013 94* 96 - 112 mEq/L Final  . CO2 05/29/2013 28  19 - 32 mEq/L Final  . Glucose, Bld 05/29/2013 134* 70 - 99 mg/dL Final  . BUN 05/29/2013 21  6 - 23 mg/dL Final  . Creatinine, Ser 05/29/2013 1.51* 0.50 - 1.10 mg/dL Final  . Calcium 05/29/2013 8.3* 8.4 - 10.5 mg/dL Final  . GFR calc non Af Amer 05/29/2013 34* >90 mL/min Final  . GFR calc Af Amer 05/29/2013 40* >90 mL/min Final   Comment: (NOTE)                          The eGFR has been calculated using  the CKD EPI equation.                          This calculation has not been validated in all clinical situations.                          eGFR's persistently <90 mL/min signify possible Chronic Kidney                          Disease.  . Glucose-Capillary 05/28/2013 133* 70 - 99 mg/dL Final  . Glucose-Capillary 05/29/2013 145* 70 - 99 mg/dL Final  . Glucose-Capillary 05/29/2013 160* 70 - 99 mg/dL Final  . WBC 05/30/2013 8.5  4.0 - 10.5 K/uL Final  . RBC 05/30/2013 3.33* 3.87 - 5.11 MIL/uL Final  . Hemoglobin 05/30/2013 9.7* 12.0 - 15.0 g/dL Final  . HCT 05/30/2013 29.5* 36.0 - 46.0 % Final  . MCV 05/30/2013 88.6  78.0 - 100.0 fL Final  . MCH 05/30/2013 29.1  26.0 - 34.0 pg Final  . MCHC 05/30/2013 32.9  30.0 - 36.0 g/dL Final  . RDW 05/30/2013 13.9  11.5 - 15.5 % Final    . Platelets 05/30/2013 353  150 - 400 K/uL Final  . Sodium 05/30/2013 136* 137 - 147 mEq/L Final  . Potassium 05/30/2013 4.3  3.7 - 5.3 mEq/L Final  . Chloride 05/30/2013 98  96 - 112 mEq/L Final  . CO2 05/30/2013 28  19 - 32 mEq/L Final  . Glucose, Bld 05/30/2013 157* 70 - 99 mg/dL Final  . BUN 05/30/2013 22  6 - 23 mg/dL Final  . Creatinine, Ser 05/30/2013 1.14* 0.50 - 1.10 mg/dL Final  . Calcium 05/30/2013 8.7  8.4 - 10.5 mg/dL Final  . GFR calc non Af Amer 05/30/2013 48* >90 mL/min Final  . GFR calc Af Amer 05/30/2013 56* >90 mL/min Final   Comment: (NOTE)                          The eGFR has been calculated using the CKD EPI equation.                          This calculation has not been validated in all clinical situations.                          eGFR's persistently <90 mL/min signify possible Chronic Kidney                          Disease.  . Glucose-Capillary 05/29/2013 201* 70 - 99 mg/dL Final  . Glucose-Capillary 05/29/2013 189* 70 - 99 mg/dL Final  . Glucose-Capillary 05/30/2013 132* 70 - 99 mg/dL Final  . Glucose-Capillary 05/30/2013 171* 70 - 99 mg/dL Final  . WBC 05/31/2013 8.1  4.0 - 10.5 K/uL Final  . RBC 05/31/2013 3.34* 3.87 - 5.11 MIL/uL Final  . Hemoglobin 05/31/2013 9.7* 12.0 - 15.0 g/dL Final  . HCT 05/31/2013 29.9* 36.0 - 46.0 % Final  . MCV 05/31/2013 89.5  78.0 - 100.0 fL Final  . MCH 05/31/2013 29.0  26.0 - 34.0 pg Final  . MCHC 05/31/2013 32.4  30.0 - 36.0 g/dL Final  . RDW 05/31/2013 14.2  11.5 - 15.5 % Final  . Platelets 05/31/2013 350  150 - 400 K/uL Final  . Glucose-Capillary 05/30/2013 113* 70 - 99 mg/dL Final  . Glucose-Capillary 05/30/2013 122* 70 - 99 mg/dL Final  . Glucose-Capillary 05/31/2013 147* 70 - 99 mg/dL Final  . Comment 1 05/31/2013 Notify RN   Final  . Comment 2 05/31/2013 Documented in [facility] Outpatient Visit on 05/24/2013  Component Date Value Ref Range Status  . MRSA, PCR 05/24/2013 NEGATIVE  NEGATIVE Final   . Staphylococcus aureus 05/24/2013 NEGATIVE  NEGATIVE Final   Comment:                                 The Xpert SA Assay (FDA                          approved for NASAL specimens                          in patients over 81 years of age),                          is one component of                          a comprehensive surveillance                          program.  Test performance has                          been validated by [employer] for patients greater                          than or equal to 91 year old.                          It is not intended                          to diagnose infection nor to                          guide or monitor treatment.  Marland Kitchen aPTT 05/24/2013 27  24 - 37 seconds Final  . WBC 05/24/2013 7.7  4.0 - 10.5 K/uL Final  . RBC 05/24/2013 4.09  3.87 - 5.11 MIL/uL Final  . Hemoglobin 05/24/2013 12.1  12.0 - 15.0 g/dL Final  . HCT 05/24/2013 35.9* 36.0 - 46.0 % Final  . MCV 05/24/2013 87.8  78.0 - 100.0 fL Final  . MCH 05/24/2013 29.6  26.0 - 34.0 pg Final  . MCHC 05/24/2013 33.7  30.0 - 36.0 g/dL Final  . RDW 05/24/2013 13.6  11.5 - 15.5 % Final  . Platelets 05/24/2013 410* 150 - 400 K/uL Final  . Sodium 05/24/2013 136* 137 - 147 mEq/L Final  . Potassium 05/24/2013 4.3  3.7 - 5.3 mEq/L Final  . Chloride 05/24/2013 94* 96 - 112 mEq/L Final  . CO2 05/24/2013 27  19 -  32 mEq/L Final  . Glucose, Bld 05/24/2013 113* 70 - 99 mg/dL Final  . BUN 05/24/2013 19  6 - 23 mg/dL Final  . Creatinine, Ser 05/24/2013 1.17* 0.50 - 1.10 mg/dL Final  . Calcium 05/24/2013 10.0  8.4 - 10.5 mg/dL Final  . Total Protein 05/24/2013 7.8  6.0 - 8.3 g/dL Final  . Albumin 05/24/2013 4.3  3.5 - 5.2 g/dL Final  . AST 05/24/2013 11  0 - 37 U/L Final  . ALT 05/24/2013 12  0 - 35 U/L Final  . Alkaline Phosphatase 05/24/2013 100  39 - 117 U/L Final  . Total Bilirubin 05/24/2013 0.3  0.3 - 1.2 mg/dL Final  . GFR calc non Af Amer 05/24/2013 46* >90  mL/min Final  . GFR calc Af Amer 05/24/2013 54* >90 mL/min Final   Comment: (NOTE)                          The eGFR has been calculated using the CKD EPI equation.                          This calculation has not been validated in all clinical situations.                          eGFR's persistently <90 mL/min signify possible Chronic Kidney                          Disease.  Marland Kitchen Prothrombin Time 05/24/2013 11.8  11.6 - 15.2 seconds Final  . INR 05/24/2013 0.88  0.00 - 1.49 Final  . ABO/RH(D) 05/24/2013 O NEG   Final  . Antibody Screen 05/24/2013 NEG   Final  . Sample Expiration 05/24/2013 05/31/2013   Final  . Color, Urine 05/24/2013 YELLOW  YELLOW Final  . APPearance 05/24/2013 CLEAR  CLEAR Final  . Specific Gravity, Urine 05/24/2013 1.008  1.005 - 1.030 Final  . pH 05/24/2013 5.0  5.0 - 8.0 Final  . Glucose, UA 05/24/2013 NEGATIVE  NEGATIVE mg/dL Final  . Hgb urine dipstick 05/24/2013 NEGATIVE  NEGATIVE Final  . Bilirubin Urine 05/24/2013 NEGATIVE  NEGATIVE Final  . Ketones, ur 05/24/2013 NEGATIVE  NEGATIVE mg/dL Final  . Protein, ur 05/24/2013 NEGATIVE  NEGATIVE mg/dL Final  . Urobilinogen, UA 05/24/2013 0.2  0.0 - 1.0 mg/dL Final  . Nitrite 05/24/2013 NEGATIVE  NEGATIVE Final  . Leukocytes, UA 05/24/2013 TRACE* NEGATIVE Final  . WBC, UA 05/24/2013 0-2  <3 WBC/hpf Final  . ABO/RH(D) 05/24/2013 O NEG   Final     X-Rays:Dg Chest 2 View  05/24/2013   CLINICAL DATA:  Preoperative study for left total knee replacement, history of dysrhythmia and chronic dyspnea  EXAM: CHEST  2 VIEW  COMPARISON:  DG CHEST 2 VIEW dated 03/17/2011  FINDINGS: The lungs are well-expanded. There is a stable calcified 5 mm nodule in the right mid lung consistent with previous granulomatous infection. The cardiac silhouette is normal in size. The pulmonary vascularity is not engorged. The mediastinum is normal in width. There is no pleural effusion. There is curvature of the thoracolumbar spine with the convexity  towards the right.  IMPRESSION: There is no evidence of active cardiopulmonary disease. There is evidence of previous granulomatous infection.   Electronically Signed   By: David  Martinique   On: 05/24/2013 15:35    EKG: Orders placed  during the hospital encounter of 05/24/13  . EKG 12-LEAD  . EKG 12-LEAD     Hospital Course: Vanessa Frank is a 68 y.o. who was admitted to [facility]. They were brought to the operating room on 05/28/2013 and underwent Procedure(s): LEFT TOTAL KNEE ARTHROPLASTY.  Patient tolerated the procedure well and was later transferred to the recovery room and then to the orthopaedic floor for postoperative care.  They were given PO and IV analgesics for pain control following their surgery.  They were given 24 hours of postoperative antibiotics of  Anti-infectives   Start     Dose/Rate Route Frequency Ordered Stop   05/28/13 1300  ceFAZolin (ANCEF) IVPB 2 g/50 mL premix     2 g 100 mL/hr over 30 Minutes Intravenous Every 6 hours 05/28/13 1036 05/28/13 1838   05/28/13 0509  ceFAZolin (ANCEF) IVPB 2 g/50 mL premix     2 g 100 mL/hr over 30 Minutes Intravenous On call to O.R. 05/28/13 7425 05/28/13 0715     and started on DVT prophylaxis in the form of Xarelto.   PT and OT were ordered for total joint protocol.  Discharge planning consulted to help with postop disposition and equipment needs.  Patient had a tough night on the evening of surgery with complaints of her back being sore.  They started to get up OOB with therapy on day one. Hemovac drain was pulled without difficulty.  Continued to work with therapy into day two. She was feeling better.  Dressing was changed on day two and the incision was healing well.  By day three, the patient had progressed with therapy and meeting their goals.  Incision was healing well.  Patient was seen in rounds and was ready to go [facility] for more therapy.   Discharge Medications: Prior to Admission medications   Medication  Sig Start Date End Date Taking? Authorizing Provider  atorvastatin (LIPITOR) 10 MG tablet Take 10 mg by mouth at bedtime.    Yes Historical Provider, MD  esomeprazole (NEXIUM) 20 MG capsule Take 20 mg by mouth 2 (two) times daily before a meal.   Yes Historical Provider, MD  furosemide (LASIX) 40 MG tablet Take 40 mg by mouth every morning.    Yes Historical Provider, MD  lisinopril (PRINIVIL,ZESTRIL) 10 MG tablet Take 10 mg by mouth at bedtime.    Yes Historical Provider, MD  metFORMIN (GLUCOPHAGE) 1000 MG tablet Take 1,000 mg by mouth 2 (two) times daily with a meal.     Yes Historical Provider, MD  metoCLOPramide (REGLAN) 10 MG tablet Take 10 mg by mouth 2 (two) times daily.     Yes Historical Provider, MD  pioglitazone (ACTOS) 30 MG tablet Take 30 mg by mouth every morning.    Yes Historical Provider, MD  sertraline (ZOLOFT) 100 MG tablet Take 100 mg by mouth every morning.    Yes Historical Provider, MD  acetaminophen (TYLENOL) 325 MG tablet Take 2 tablets (650 mg total) by mouth every 6 (six) hours as needed for mild pain (or Fever >/= 101). 05/31/13   Giavanni Odonovan Dara Lords, PA-C  bisacodyl (DULCOLAX) 10 MG suppository Place 1 suppository (10 mg total) rectally daily as needed for moderate constipation. 05/31/13   Denisia Harpole, PA-C  docusate sodium 100 MG CAPS Take 100 mg by mouth 2 (two) times daily. 05/31/13   Leviticus Harton Dara Lords, PA-C  methocarbamol (ROBAXIN) 500 MG tablet Take 1 tablet (500 mg total) by mouth every 6 (six) hours as needed  for muscle spasms. 05/31/13   Adrinne Sze, PA-C  ondansetron (ZOFRAN) 4 MG tablet Take 1 tablet (4 mg total) by mouth every 6 (six) hours as needed for nausea. 05/31/13   Ricardo Kayes, PA-C  oxyCODONE (OXY IR/ROXICODONE) 5 MG immediate release tablet Take 1-4 tablets (5-20 mg total) by mouth every 3 (three) hours as needed for moderate pain, severe pain or breakthrough pain. 05/31/13   Detravion Tester Dara Lords, PA-C  rivaroxaban (XARELTO) 10 MG  TABS tablet Take 1 tablet (10 mg total) by mouth daily with breakfast. Take Xarelto for two and a half more weeks, then discontinue Xarelto. Once the patient has completed the blood thinner regimen, then take a Baby 81 mg Aspirin daily for four more weeks. 05/31/13   Morrissa Shein Dara Lords, PA-C  traMADol (ULTRAM) 50 MG tablet Take 1-2 tablets (50-100 mg total) by mouth every 6 (six) hours as needed (mild to moderate pain). 05/31/13   Athaliah Baumbach Dara Lords, PA-C   Discharge to SNF  Diet - Cardiac diet and Diabetic diet  Follow up - in 2 weeks  Activity - WBAT  Disposition - Skilled nursing facility  Condition Upon Discharge - Good  D/C Meds - See DC Summary  DVT Prophylaxis - Xarelto       Discharge Orders   Future Orders Complete By Expires   Call MD / Call 911  As directed    Comments:     If you experience chest pain or shortness of breath, CALL 911 and be transported to the hospital emergency room.  If you develope a fever above 101 F, pus (white drainage) or increased drainage or redness at the wound, or calf pain, call your surgeon's office.   Change dressing  As directed    Comments:     Change dressing daily with sterile 4 x 4 inch gauze dressing and apply TED hose. Do not submerge the incision under water.   Constipation Prevention  As directed    Comments:     Drink plenty of fluids.  Prune juice may be helpful.  You may use a stool softener, such as Colace (over the counter) 100 mg twice a day.  Use MiraLax (over the counter) for constipation as needed.   Diet - low sodium heart healthy  As directed    Diet Carb Modified  As directed    Discharge instructions  As directed    Comments:     Pick up stool softner and laxative for home. Do not submerge incision under water. May shower. Continue to use ice for pain and swelling from surgery.  Take Xarelto for two and a half more weeks, then discontinue Xarelto. Once the patient has completed the blood thinner regimen, then take a  Baby 81 mg Aspirin daily for four more weeks.   Discharge instructions  As directed    Comments:     Pick up stool softner and laxative for home. Do not submerge incision under water. May shower. Continue to use ice for pain and swelling from surgery.  Take Xarelto for two and a half more weeks, then discontinue Xarelto. Once the patient has completed the blood thinner regimen, then take a Baby 81 mg Aspirin daily for four more weeks.  When discharged from the skilled rehab facility, please have the facility set up the patient's [place] prior to being released.  Also provide the patient with their medications at time of release from the facility to include their pain medication, the muscle relaxants, and their  blood thinner medication.  If the patient is still at the rehab facility at time of follow up appointment, please also assist the patient in arranging follow up appointment in our office and any transportation needs.   Do not put a pillow under the knee. Place it under the heel.  As directed    Do not sit on low chairs, stoools or toilet seats, as it may be difficult to get up from low surfaces  As directed    Driving restrictions  As directed    Comments:     No driving until released by the physician.   Increase activity slowly as tolerated  As directed    Lifting restrictions  As directed    Comments:     No lifting until released by the physician.   Patient may shower  As directed    Comments:     You may shower without a dressing once there is no drainage.  Do not wash over the wound.  If drainage remains, do not shower until drainage stops.   TED hose  As directed    Comments:     Use stockings (TED hose) for 3 weeks on both leg(s).  You may remove them at night for sleeping.   Weight bearing as tolerated  As directed    Questions:     Laterality:     Extremity:         Medication List    STOP taking these medications       B-complex with vitamin  C tablet     oxyCODONE-acetaminophen 10-325 MG per tablet  Commonly known as:  PERCOCET      TAKE these medications       acetaminophen 325 MG tablet  Commonly known as:  TYLENOL  Take 2 tablets (650 mg total) by mouth every 6 (six) hours as needed for mild pain (or Fever >/= 101).     atorvastatin 10 MG tablet  Commonly known as:  LIPITOR  Take 10 mg by mouth at bedtime.     bisacodyl 10 MG suppository  Commonly known as:  DULCOLAX  Place 1 suppository (10 mg total) rectally daily as needed for moderate constipation.     DSS 100 MG Caps  Take 100 mg by mouth 2 (two) times daily.     esomeprazole 20 MG capsule  Commonly known as:  NEXIUM  Take 20 mg by mouth 2 (two) times daily before a meal.     furosemide 40 MG tablet  Commonly known as:  LASIX  Take 40 mg by mouth every morning.     lisinopril 10 MG tablet  Commonly known as:  PRINIVIL,ZESTRIL  Take 10 mg by mouth at bedtime.     metFORMIN 1000 MG tablet  Commonly known as:  GLUCOPHAGE  Take 1,000 mg by mouth 2 (two) times daily with a meal.     methocarbamol 500 MG tablet  Commonly known as:  ROBAXIN  Take 1 tablet (500 mg total) by mouth every 6 (six) hours as needed for muscle spasms.     metoCLOPramide 10 MG tablet  Commonly known as:  REGLAN  Take 10 mg by mouth 2 (two) times daily.     ondansetron 4 MG tablet  Commonly known as:  ZOFRAN  Take 1 tablet (4 mg total) by mouth every 6 (six) hours as needed for nausea.     oxyCODONE 5 MG immediate release tablet  Commonly known as:  Oxy IR/ROXICODONE  Take  1-4 tablets (5-20 mg total) by mouth every 3 (three) hours as needed for moderate pain, severe pain or breakthrough pain.     pioglitazone 30 MG tablet  Commonly known as:  ACTOS  Take 30 mg by mouth every morning.     rivaroxaban 10 MG Tabs tablet  Commonly known as:  XARELTO  - Take 1 tablet (10 mg total) by mouth daily with breakfast. Take Xarelto for two and a half more weeks, then discontinue  Xarelto.  - Once the patient has completed the blood thinner regimen, then take a Baby 81 mg Aspirin daily for four more weeks.     sertraline 100 MG tablet  Commonly known as:  ZOLOFT  Take 100 mg by mouth every morning.     traMADol 50 MG tablet  Commonly known as:  ULTRAM  Take 1-2 tablets (50-100 mg total) by mouth every 6 (six) hours as needed (mild to moderate pain).       Follow-up Information   Follow up with Gearlean Alf, MD. Schedule an appointment as soon as possible for a visit on 06/12/2013.   Specialty:  Orthopedic Surgery   Contact information:   [address] [place] [address] [phone]       Signed: Mickel Crow 05/31/2013, 7:59 AM

## 2013-05-31 NOTE — Telephone Encounter (Signed)
[facility] 

## 2013-05-31 NOTE — Progress Notes (Signed)
Clinical Social Work Department CLINICAL SOCIAL WORK PLACEMENT NOTE 05/31/2013  Patient:  Vanessa Frank,Vanessa Frank  Account Number:  [id number] Admit date:  05/28/2013  Clinical Social Worker:  Cori RazorJAMIE Trevionne Advani, LCSW  Date/time:  05/29/2013 11:58 AM  Clinical Social Work is seeking post-discharge placement for this patient at the following level of care:   SKILLED NURSING   (*CSW will update this form in Epic as items are completed)   05/29/2013  [facility]/family provided with [facility] Department of Clinical Social Work's list of facilities offering this level of care within the geographic area requested by the patient (or if unable, by the patient's family).  05/29/2013  Patient/family informed of their freedom to choose among providers that offer the needed level of care, that participate in Medicare, Medicaid or managed care program needed by the patient, have an available bed and are willing to accept the patient.    Patient/family informed of [facility]' ownership interest in [facility], as well as of the fact that they are under no obligation to receive care at this facility.  PASARR submitted to EDS on 05/29/2013 PASARR number received from EDS on 05/29/2013  FL2 transmitted to all facilities in geographic area requested by pt/family on  05/29/2013 FL2 transmitted to all facilities within larger geographic area on   Patient informed that his/her managed care company has contracts with or will negotiate with  certain facilities, including the following:     Patient/family informed of bed offers received:  05[facility]29[facility]2013 [facility] recommends and patient chooses bed at    [facility]  05/31/2013 Patient to be transferred to facility by [facility]  The following physician request were entered in Epic:   Additional Comments:  Cori RazorJamie Deitra Craine LCSW [phone]

## 2013-05-31 NOTE — Progress Notes (Signed)
   Subjective: 3 Days Post-Op Procedure(s) (LRB): LEFT TOTAL KNEE ARTHROPLASTY (Left) Patient reports pain as mild.   Patient seen in rounds by Dr. Lequita HaltAluisio. Patient is well, and has had no acute complaints or problems Patient is ready to go to the SNF.  Objective: Vital signs in last 24 hours: Temp:  [98 F (36.7 C)-98.3 F (36.8 C)] 98 F (36.7 C) (02/12 0500) Pulse Rate:  [81-89] 81 (02/12 0500) Resp:  [16] 16 (02/12 0500) BP: (128-147)/(76-88) 140/88 mmHg (02/12 0500) SpO2:  [95 %-96 %] 95 % (02/12 0500)  Intake/Output from previous day:  Intake/Output Summary (Last 24 hours) at 05/31/13 0743 Last data filed at 05/31/13 0513  Gross per 24 hour  Intake    720 ml  Output      2 ml  Net    718 ml    Labs:  Recent Labs  05/29/13 0434 05/30/13 0430 05/31/13 0458  HGB 9.8* 9.7* 9.7*    Recent Labs  05/30/13 0430 05/31/13 0458  WBC 8.5 8.1  RBC 3.33* 3.34*  HCT 29.5* 29.9*  PLT 353 350    Recent Labs  05/29/13 0434 05/30/13 0430  NA 133* 136*  K 4.2 4.3  CL 94* 98  CO2 28 28  BUN 21 22  CREATININE 1.51* 1.14*  GLUCOSE 134* 157*  CALCIUM 8.3* 8.7   No results found for this basename: LABPT, INR,  in the last 72 hours  EXAM: General - Patient is Alert, Appropriate and Oriented Extremity - Neurovascular intact Sensation intact distally Dorsiflexion/Plantar flexion intact Incision - clean, dry, no drainage Motor Function - intact, moving foot and toes well on exam.   Assessment/Plan: 3 Days Post-Op Procedure(s) (LRB): LEFT TOTAL KNEE ARTHROPLASTY (Left) Procedure(s) (LRB): LEFT TOTAL KNEE ARTHROPLASTY (Left) Past Medical History  Diagnosis Date  . Hypertension   . Dysrhythmia IRREGULAR  RATE IN HER 20'S  . Diabetes mellitus ORAL MEDS ONLY  . GERD (gastroesophageal reflux disease)   . Headache(784.0)   . Arthritis   . Anxiety   . Complication of anesthesia   . Shortness of breath WITH EXERTION  . Fibromyalgia   . Neuropathy     TOES  .  Stress incontinence    Principal Problem:   OA (osteoarthritis) of knee Active Problems:   Postoperative anemia due to acute blood loss   Hyponatremia  Estimated body mass index is 40.68 kg/(m^2) as calculated from the following:   Height as of this encounter: 5\' 2"  (1.575 m).   Weight as of this encounter: 100.9 kg (222 lb 7.1 oz). Discharge to SNF Diet - Cardiac diet and Diabetic diet Follow up - in 2 weeks Activity - WBAT Disposition - Skilled nursing facility Condition Upon Discharge - Good D/C Meds - See DC Summary DVT Prophylaxis - Xarelto  PERKINS, ALEXZANDREW 05/31/2013, 7:43 AM

## 2013-05-31 NOTE — Progress Notes (Signed)
Reviewed discharge papers with patient. Called report to [facility]. IV was removed. There were no further questions at this time.

## 2013-06-01 ENCOUNTER — Non-Acute Institutional Stay (SKILLED_NURSING_FACILITY): Payer: Medicare Other | Admitting: Adult Health

## 2013-06-01 ENCOUNTER — Encounter: Payer: Self-pay | Admitting: Adult Health

## 2013-06-01 DIAGNOSIS — E119 Type 2 diabetes mellitus without complications: Secondary | ICD-10-CM | POA: Insufficient documentation

## 2013-06-01 DIAGNOSIS — IMO0002 Reserved for concepts with insufficient information to code with codable children: Secondary | ICD-10-CM

## 2013-06-01 DIAGNOSIS — E871 Hypo-osmolality and hyponatremia: Secondary | ICD-10-CM

## 2013-06-01 DIAGNOSIS — D62 Acute posthemorrhagic anemia: Secondary | ICD-10-CM

## 2013-06-01 DIAGNOSIS — E785 Hyperlipidemia, unspecified: Secondary | ICD-10-CM

## 2013-06-01 DIAGNOSIS — K219 Gastro-esophageal reflux disease without esophagitis: Secondary | ICD-10-CM

## 2013-06-01 DIAGNOSIS — F329 Major depressive disorder, single episode, unspecified: Secondary | ICD-10-CM

## 2013-06-01 DIAGNOSIS — F3289 Other specified depressive episodes: Secondary | ICD-10-CM

## 2013-06-01 DIAGNOSIS — M1710 Unilateral primary osteoarthritis, unspecified knee: Secondary | ICD-10-CM

## 2013-06-01 DIAGNOSIS — M179 Osteoarthritis of knee, unspecified: Secondary | ICD-10-CM

## 2013-06-01 DIAGNOSIS — F32A Depression, unspecified: Secondary | ICD-10-CM | POA: Insufficient documentation

## 2013-06-01 DIAGNOSIS — I10 Essential (primary) hypertension: Secondary | ICD-10-CM | POA: Insufficient documentation

## 2013-06-01 NOTE — Progress Notes (Signed)
Patient ID: Vanessa HanlonLinda R Enke, female   DOB: May 30, 1945, 68 y.o.   MRN: [id number]               PROGRESS NOTE  DATE: 06/01/2013  FACILITY: Nursing Home Location: [facility]  LEVEL OF CARE: SNF (31)  Acute Visit  CHIEF COMPLAINT:  Follow-up Hospitalization  HISTORY OF PRESENT ILLNESS: This is a 68 year old female who has been [facility] 05/31/[facility] with Osteoarthritis S/P Left Total Knee Arthroplasty. She has been admitted for a short-term rehabilitation.  REASSESSMENT OF ONGOING PROBLEM(S):  HTN: Pt 's HTN remains stable.  Denies CP, sob, DOE, pedal edema, headaches, dizziness or visual disturbances.  No complications from the medications currently being used.  Last BP : 118/72  ANEMIA: The anemia has been stable. The patient denies fatigue, melena or hematochezia. No complications from the medications currently being used. 2/15 hgb 9.7  DEPRESSION: The depression remains stable. Patient denies ongoing feelings of sadness, insomnia, anedhonia or lack of appetite. No complications reported from the medications currently being used. Staff do not report behavioral problems.  PAST MEDICAL HISTORY : Reviewed.  No changes.  [facility]S: [facility]  REVIEW OF SYSTEMS:  GENERAL: no change in appetite, no fatigue, no weight changes, no fever, chills or weakness RESPIRATORY: no cough, SOB, DOE, wheezing, hemoptysis CARDIAC: no chest pain, edema or palpitations GI: no abdominal pain, diarrhea, constipation, heart burn, nausea or vomiting  PHYSICAL EXAMINATION   GENERAL: no acute distress, normal body habitus EYES: conjunctivae normal, sclerae normal, normal eye lids NECK: supple, trachea midline, no neck masses, no thyroid tenderness, no thyromegaly LYMPHATICS: no LAN in the neck, no supraclavicular LAN RESPIRATORY: breathing is even & unlabored, BS CTAB CARDIAC: RRR, no murmur,no extra heart sounds, no edema GI:  abdomen soft, normal BS, no masses, no tenderness, no hepatomegaly, no splenomegaly PSYCHIATRIC: the patient is alert & oriented to person, affect & behavior appropriate  LABS/RADIOLOGY: Labs reviewed: Basic Metabolic Panel:  Recent Labs  30/86/5700/08/31 1505 05/29/13 0434 05/30/13 0430  NA 136* 133* 136*  K 4.3 4.2 4.3  CL 94* 94* 98  CO2 27 28 28   GLUCOSE 113* 134* 157*  BUN 19 21 22   CREATININE 1.17* 1.51* 1.14*  CALCIUM 10.0 8.3* 8.7   Liver Function Tests:  Recent Labs  05/24/13 1505  AST 11  ALT 12  ALKPHOS 100  BILITOT 0.3  PROT 7.8  ALBUMIN 4.3   CBC:  Recent Labs  05/29/13 0434 05/30/13 0430 05/31/13 0458  WBC 9.4 8.5 8.1  HGB 9.8* 9.7* 9.7*  HCT 29.6* 29.5* 29.9*  MCV 88.4 88.6 89.5  PLT 349 353 350    CBG:  Recent Labs  05/30/13 1754 05/30/13 2226 05/31/13 0725  GLUCAP 113* 122* 147*    ASSESSMENT/PLAN:  Osteoarthritis S/P Left Total Knee Arthroplasty - for rehabilitation Hyperlipidemia - continue Lipitor Hypertension - well-controlled Depression - continue Zoloft GERD - stable Diabetes Mellitus, type 2 - well-controlled Anemia - stable; check cbc Hyponatremia - stable;  Check bmp    CPT CODE: [id number]

## 2013-06-14 ENCOUNTER — Encounter: Payer: Self-pay | Admitting: Internal Medicine

## 2013-06-14 ENCOUNTER — Non-Acute Institutional Stay (SKILLED_NURSING_FACILITY): Payer: Medicare Other | Admitting: Internal Medicine

## 2013-06-14 DIAGNOSIS — F3289 Other specified depressive episodes: Secondary | ICD-10-CM

## 2013-06-14 DIAGNOSIS — D62 Acute posthemorrhagic anemia: Secondary | ICD-10-CM

## 2013-06-14 DIAGNOSIS — K219 Gastro-esophageal reflux disease without esophagitis: Secondary | ICD-10-CM

## 2013-06-14 DIAGNOSIS — F32A Depression, unspecified: Secondary | ICD-10-CM

## 2013-06-14 DIAGNOSIS — E119 Type 2 diabetes mellitus without complications: Secondary | ICD-10-CM

## 2013-06-14 DIAGNOSIS — E785 Hyperlipidemia, unspecified: Secondary | ICD-10-CM

## 2013-06-14 DIAGNOSIS — F329 Major depressive disorder, single episode, unspecified: Secondary | ICD-10-CM

## 2013-06-14 DIAGNOSIS — Z96659 Presence of unspecified artificial knee joint: Secondary | ICD-10-CM

## 2013-06-14 DIAGNOSIS — I10 Essential (primary) hypertension: Secondary | ICD-10-CM

## 2013-06-14 NOTE — Assessment & Plan Note (Signed)
Will continue Zoloft 100 mg daily

## 2013-06-14 NOTE — Assessment & Plan Note (Signed)
Continue zestril and lasix

## 2013-06-14 NOTE — Assessment & Plan Note (Signed)
2/2 endstage osteoarthritis; OT/PT; prophylaxed on Xarelto

## 2013-06-14 NOTE — Assessment & Plan Note (Signed)
Continue Lipitor 10 mg;most recent LFT's are nl

## 2013-06-14 NOTE — Assessment & Plan Note (Signed)
Will continue actos and metformin; pt on a ACE and statin

## 2013-06-14 NOTE — Assessment & Plan Note (Signed)
Hb 9.7 two  weeks ago; rechecked 2/16 and it was 9.8 so holding well

## 2013-06-14 NOTE — Assessment & Plan Note (Signed)
Nexium 20 mg increased to 40 mg to better control sx

## 2013-06-14 NOTE — Progress Notes (Signed)
MRN: [id number] Name: Vanessa Frank  Sex: female Age: 68 y.o. DOB: 11-30-1945  [facility] Facility/Room: 602 Level Of Care: SNF Provider: Merrilee SeashoreALEXANDER, [name] Emergency Contacts: Extended Emergency Contact Information Primary Emergency Contact: [facility] Address: [address]          [place], [place] [address] [place] Home Phone: [phone] Mobile Phone: [phone] Relation: Spouse Secondary Emergency Contact: Holdsworth,Jefferey Address: [address]          [place] , [place] [address] [place] Home Phone: [phone] Mobile Phone: [phone] Relation: Son  Code Status: FULL  Allergies: Adhesive; Codeine; and Latex  Chief Complaint  Patient presents with  . nursing home admission    HPI: Patient is 68 y.o. female who is admitted for OT/PT s/p total L knee arthroplasty.  Past Medical History  Diagnosis Date  . Hypertension   . Dysrhythmia IRREGULAR  RATE IN HER 20'S  . Diabetes mellitus ORAL MEDS ONLY  . GERD (gastroesophageal reflux disease)   . Headache(784.0)   . Arthritis   . Anxiety   . Complication of anesthesia   . Shortness of breath WITH EXERTION  . Fibromyalgia   . Neuropathy     TOES  . Stress incontinence     Past Surgical History  Procedure Laterality Date  . Abdominal hysterectomy  80'S  . Cholecystectomy  90'S  . Bartholin gland cyst excision  80'S  . Arthroscopy knee w/ drilling  09 LEFT   2012 RIGHT  . Cystoscopy  80'S  . Anterior lat lumbar fusion  03/26/2011    Procedure: ANTERIOR LATERAL LUMBAR FUSION 1 LEVEL;  Surgeon: [name];  Location: [facility];  Service: Neurosurgery;  Laterality: Left;  Left Lumbar three-four anterolateral retroperitoneal interbody fusion   . Total knee arthroplasty Left 05/28/2013    Procedure: LEFT TOTAL KNEE ARTHROPLASTY;  Surgeon: [name], MD;  Location: [facility];  Service: Orthopedics;  Laterality: Left;      Medication List       This list is  accurate as of: 06/14/13  7:26 PM.  Always use your most recent med list.               acetaminophen 325 MG tablet  Commonly known as:  TYLENOL  Take 2 tablets (650 mg total) by mouth every 6 (six) hours as needed for mild pain (or Fever >/= 101).     atorvastatin 10 MG tablet  Commonly known as:  LIPITOR  Take 10 mg by mouth at bedtime.     bisacodyl 10 MG suppository  Commonly known as:  DULCOLAX  Place 1 suppository (10 mg total) rectally daily as needed for moderate constipation.     DSS 100 MG Caps  Take 100 mg by mouth 2 (two) times daily.     esomeprazole 20 MG capsule  Commonly known as:  NEXIUM  Take 20 mg by mouth 2 (two) times daily before a meal.     furosemide 40 MG tablet  Commonly known as:  LASIX  Take 40 mg by mouth every morning.     lisinopril 10 MG tablet  Commonly known as:  PRINIVIL,ZESTRIL  Take 10 mg by mouth at bedtime.     metFORMIN 1000 MG tablet  Commonly known as:  GLUCOPHAGE  Take 1,000 mg by mouth 2 (two) times daily with a meal.     methocarbamol 500 MG tablet  Commonly known as:  ROBAXIN  Take 1  tablet (500 mg total) by mouth every 6 (six) hours as needed for muscle spasms.     metoCLOPramide 10 MG tablet  Commonly known as:  REGLAN  Take 10 mg by mouth 2 (two) times daily.     ondansetron 4 MG tablet  Commonly known as:  ZOFRAN  Take 1 tablet (4 mg total) by mouth every 6 (six) hours as needed for nausea.     oxyCODONE 5 MG immediate release tablet  Commonly known as:  Oxy IR/ROXICODONE  Take one to three tablets by mouth every three hours as needed for moderate,severe or breakthrough pain     pioglitazone 30 MG tablet  Commonly known as:  ACTOS  Take 30 mg by mouth every morning.     rivaroxaban 10 MG Tabs tablet  Commonly known as:  XARELTO  - Take 1 tablet (10 mg total) by mouth daily with breakfast. Take Xarelto for two and a half more weeks, then discontinue Xarelto.  - Once the patient has completed the blood thinner  regimen, then take a Baby 81 mg Aspirin daily for four more weeks.     sertraline 100 MG tablet  Commonly known as:  ZOLOFT  Take 100 mg by mouth every morning.     traMADol 50 MG tablet  Commonly known as:  ULTRAM  Take one to two tablets by mouth every 6 hours as needed for mild to moderate pain        No orders of the defined types were placed in this encounter.    Immunization History  Administered Date(s) Administered  . Influenza Split 03/28/2011    History  Substance Use Topics  . Smoking status: Never Smoker   . Smokeless tobacco: Never Used  . Alcohol Use: No    Family history is noncontributory    Review of Systems  DATA OBTAINED: from patient, family member GENERAL: Feels well no fevers, fatigue, appetite changes SKIN: No itching, rash or wounds EYES: No eye pain, redness, discharge EARS: No earache, tinnitus, change in hearing NOSE: No congestion, drainage or bleeding  MOUTH/THROAT: No mouth or tooth pain, No sore throat, No difficulty chewing or swallowing  RESPIRATORY: No cough, wheezing, SOB CARDIAC: No chest pain, palpitations, lower extremity edema  GI: No abdominal pain, No N/V/D or constipation, No heartburn or reflux  GU: No dysuria, frequency or urgency, or incontinence  MUSCULOSKELETAL: hurts some after PT session but doing well NEUROLOGIC: No headache, dizziness or focal weakness PSYCHIATRIC: No overt anxiety or sadness. Sleeps well. No behavior issue.   Filed Vitals:   06/14/13 1512  BP: 124/62  Pulse: 81  Temp: 97.3 F (36.3 C)  Resp: 20    Physical Exam  GENERAL APPEARANCE: Alert, conversant. Appropriately groomed. No acute distress.  SKIN: No diaphoresis rash HEAD: Normocephalic, atraumatic  EYES: Conjunctiva/lids clear. Pupils round, reactive. EOMs intact.  EARS: External exam WNL, canals clear. Hearing grossly normal.  NOSE: No deformity or discharge.  MOUTH/THROAT: Lips w/o lesions.  RESPIRATORY: Breathing is even,  unlabored. Lung sounds are clear   CARDIOVASCULAR: Heart RRR no murmurs, rubs or gallops. No peripheral edema.  GASTROINTESTINAL: Abdomen is soft, non-tender, not distended w/ normal bowel sounds GENITOURINARY: Bladder non tender, not distended  NEUROLOGIC: Oriented X3. Cranial nerves 2-12 grossly intact. Moves all extremities no tremor. PSYCHIATRIC: Mood and affect appropriate to situation, no behavioral issues  Patient Active Problem List   Diagnosis Date Noted  . Hx of total knee arthroplasty 06/14/2013  . Hyperlipidemia 06/01/2013  .  Hypertension 06/01/2013  . Depression 06/01/2013  . GERD (gastroesophageal reflux disease) 06/01/2013  . Diabetes mellitus 06/01/2013  . Postoperative anemia due to acute blood loss 05/29/2013  . Hyponatremia 05/29/2013  . OA (osteoarthritis) of knee 05/28/2013    CBC    Component Value Date/Time   WBC 8.1 05/31/2013 0458   RBC 3.34* 05/31/2013 0458   HGB 9.7* 05/31/2013 0458   HCT 29.9* 05/31/2013 0458   PLT 350 05/31/2013 0458   MCV 89.5 05/31/2013 0458   LYMPHSABS 2.7 03/17/2011 1522   MONOABS 0.5 03/17/2011 1522   EOSABS 0.2 03/17/2011 1522   BASOSABS 0.0 03/17/2011 1522    CMP     Component Value Date/Time   NA 136* 05/30/2013 0430   K 4.3 05/30/2013 0430   CL 98 05/30/2013 0430   CO2 28 05/30/2013 0430   GLUCOSE 157* 05/30/2013 0430   BUN 22 05/30/2013 0430   CREATININE 1.14* 05/30/2013 0430   CALCIUM 8.7 05/30/2013 0430   PROT 7.8 05/24/2013 1505   ALBUMIN 4.3 05/24/2013 1505   AST 11 05/24/2013 1505   ALT 12 05/24/2013 1505   ALKPHOS 100 05/24/2013 1505   BILITOT 0.3 05/24/2013 1505   GFRNONAA 48* 05/30/2013 0430   GFRAA 56* 05/30/2013 0430    Assessment and Plan  Hx of total knee arthroplasty 2/2 endstage osteoarthritis; OT/PT; prophylaxed on Xarelto  Postoperative anemia due to acute blood loss Hb 9.7 two  weeks ago; rechecked 2/16 and it was 9.8 so holding well  Hypertension Continue zestril and lasix  GERD (gastroesophageal reflux  disease) Nexium 20 mg increased to 40 mg to better control sx  Diabetes mellitus Will continue actos and metformin; pt on a ACE and statin  Hyperlipidemia Continue Lipitor 10 mg;most recent LFT's are nl  Depression Will continue Zoloft 100 mg daily     Margit Hanks, MD

## 2013-06-15 ENCOUNTER — Non-Acute Institutional Stay (SKILLED_NURSING_FACILITY): Payer: Medicare Other | Admitting: Adult Health

## 2013-06-15 DIAGNOSIS — M179 Osteoarthritis of knee, unspecified: Secondary | ICD-10-CM

## 2013-06-15 DIAGNOSIS — E871 Hypo-osmolality and hyponatremia: Secondary | ICD-10-CM

## 2013-06-15 DIAGNOSIS — IMO0002 Reserved for concepts with insufficient information to code with codable children: Secondary | ICD-10-CM

## 2013-06-15 DIAGNOSIS — E119 Type 2 diabetes mellitus without complications: Secondary | ICD-10-CM

## 2013-06-15 DIAGNOSIS — M1710 Unilateral primary osteoarthritis, unspecified knee: Secondary | ICD-10-CM

## 2013-06-15 DIAGNOSIS — F3289 Other specified depressive episodes: Secondary | ICD-10-CM

## 2013-06-15 DIAGNOSIS — D62 Acute posthemorrhagic anemia: Secondary | ICD-10-CM

## 2013-06-15 DIAGNOSIS — E785 Hyperlipidemia, unspecified: Secondary | ICD-10-CM

## 2013-06-15 DIAGNOSIS — F32A Depression, unspecified: Secondary | ICD-10-CM

## 2013-06-15 DIAGNOSIS — K219 Gastro-esophageal reflux disease without esophagitis: Secondary | ICD-10-CM

## 2013-06-15 DIAGNOSIS — F329 Major depressive disorder, single episode, unspecified: Secondary | ICD-10-CM

## 2013-06-15 DIAGNOSIS — I10 Essential (primary) hypertension: Secondary | ICD-10-CM

## 2013-06-16 NOTE — Progress Notes (Signed)
Patient ID: Vanessa Frank, female   DOB: 1945-05-06, 68 y.o.   MRN: [id number]                 PROGRESS NOTE  DATE: 06/15/13  FACILITY: Nursing Home Location: [facility]  LEVEL OF CARE: SNF (31)  Acute Visit  CHIEF COMPLAINT:  Discharge Notes  HISTORY OF PRESENT ILLNESS: This is a 68 year old female who is for discharge home with Home health PT and OT. She has been admitted to [facility] on 05/31/13 from [facility] with Osteoarthritis S/P Left Total Knee Arthroplasty.  Patient was admitted to this facility for short-term rehabilitation after the patient's recent hospitalization.  Patient has completed SNF rehabilitation and therapy has cleared the patient for discharge.  REASSESSMENT OF ONGOING PROBLEM(S):  HTN: Pt 's HTN remains stable.  Denies CP, sob, DOE, pedal edema, headaches, dizziness or visual disturbances.  No complications from the medications currently being used.  Last BP : 114/71  GERD: pt's GERD is stable.  Denies ongoing heartburn, abd. Pain, nausea or vomiting.  Currently on a PPI & tolerates it without any adverse reactions.  ANEMIA: The anemia has been stable. The patient denies fatigue, melena or hematochezia. No complications from the medications currently being used. 2/15 hgb 9.8   PAST MEDICAL HISTORY : Reviewed.  No changes.  CURRENT MEDICATIONS: Reviewed per [facility]  REVIEW OF SYSTEMS:  GENERAL: no change in appetite, no fatigue, no weight changes, no fever, chills or weakness RESPIRATORY: no cough, SOB, DOE, wheezing, hemoptysis CARDIAC: no chest pain, edema or palpitations GI: no abdominal pain, diarrhea, constipation, heart burn, nausea or vomiting  PHYSICAL EXAMINATION  GENERAL: no acute distress, obese NECK: supple, trachea midline, no neck masses, no thyroid tenderness, no thyromegaly LYMPHATICS: no LAN in the neck, no supraclavicular LAN RESPIRATORY: breathing is even & unlabored, BS CTAB CARDIAC: RRR, no murmur,no  extra heart sounds, no edema GI: abdomen soft, normal BS, no masses, no tenderness, no hepatomegaly, no splenomegaly PSYCHIATRIC: the patient is alert & oriented to person, affect & behavior appropriate  LABS/RADIOLOGY:  Labs reviewed:  06/04/13  Wbc 7.2  hgb 9.8  hct 31.0 Basic Metabolic Panel:  Recent Labs  30/09/23 1505 05/29/13 0434 05/30/13 0430  NA 136* 133* 136*  K 4.3 4.2 4.3  CL 94* 94* 98  CO2 27 28 28   GLUCOSE 113* 134* 157*  BUN 19 21 22   CREATININE 1.17* 1.51* 1.14*  CALCIUM 10.0 8.3* 8.7   Liver Function Tests:  Recent Labs  05/24/13 1505  AST 11  ALT 12  ALKPHOS 100  BILITOT 0.3  PROT 7.8  ALBUMIN 4.3   CBC:  Recent Labs  05/29/13 0434 05/30/13 0430 05/31/13 0458  WBC 9.4 8.5 8.1  HGB 9.8* 9.7* 9.7*  HCT 29.6* 29.5* 29.9*  MCV 88.4 88.6 89.5  PLT 349 353 350    CBG:  Recent Labs  05/30/13 1754 05/30/13 2226 05/31/13 0725  GLUCAP 113* 122* 147*    ASSESSMENT/PLAN:  Osteoarthritis S/P Left Total Knee Arthroplasty - for Home health PT and OT Hyperlipidemia - continue Lipitor Hypertension - well-controlled Depression - continue Zoloft GERD - stable; continue Nexium Diabetes Mellitus, type 2 - well-controlled Anemia - stable Hyponatremia - stable   I have filled out patient's discharge paperwork and written prescriptions.  Patient will receive home health PT and OT.    Total discharge time: Less than 30 minutes Discharge time involved coordination of the discharge process with Child psychotherapist,  nursing staff and therapy department. Medical justification for home health services verified.  CPT CODE: [id number]  Ella BodoMonina Vargas - NP [facility] [phone]

## 2013-06-30 ENCOUNTER — Other Ambulatory Visit: Payer: Self-pay | Admitting: Adult Health

## 2013-07-09 ENCOUNTER — Ambulatory Visit: Payer: Medicare Other | Attending: Specialist | Admitting: Physical Therapy

## 2013-07-09 DIAGNOSIS — E119 Type 2 diabetes mellitus without complications: Secondary | ICD-10-CM | POA: Insufficient documentation

## 2013-07-09 DIAGNOSIS — M25519 Pain in unspecified shoulder: Secondary | ICD-10-CM | POA: Insufficient documentation

## 2013-07-09 DIAGNOSIS — M25619 Stiffness of unspecified shoulder, not elsewhere classified: Secondary | ICD-10-CM | POA: Insufficient documentation

## 2013-07-09 DIAGNOSIS — IMO0001 Reserved for inherently not codable concepts without codable children: Secondary | ICD-10-CM | POA: Insufficient documentation

## 2013-07-09 DIAGNOSIS — I10 Essential (primary) hypertension: Secondary | ICD-10-CM | POA: Insufficient documentation

## 2013-07-12 ENCOUNTER — Ambulatory Visit: Payer: Medicare Other | Admitting: Physical Therapy

## 2013-07-16 ENCOUNTER — Ambulatory Visit: Payer: Medicare Other | Admitting: Physical Therapy

## 2013-07-18 ENCOUNTER — Ambulatory Visit: Payer: Medicare Other | Admitting: Physical Therapy

## 2013-07-19 ENCOUNTER — Ambulatory Visit: Payer: Medicare Other | Attending: Specialist | Admitting: Physical Therapy

## 2013-07-19 DIAGNOSIS — I10 Essential (primary) hypertension: Secondary | ICD-10-CM | POA: Insufficient documentation

## 2013-07-19 DIAGNOSIS — M25519 Pain in unspecified shoulder: Secondary | ICD-10-CM | POA: Insufficient documentation

## 2013-07-19 DIAGNOSIS — IMO0001 Reserved for inherently not codable concepts without codable children: Secondary | ICD-10-CM | POA: Insufficient documentation

## 2013-07-19 DIAGNOSIS — E119 Type 2 diabetes mellitus without complications: Secondary | ICD-10-CM | POA: Insufficient documentation

## 2013-07-19 DIAGNOSIS — M25619 Stiffness of unspecified shoulder, not elsewhere classified: Secondary | ICD-10-CM | POA: Insufficient documentation

## 2013-07-27 ENCOUNTER — Ambulatory Visit: Payer: Medicare Other | Admitting: Physical Therapy

## 2013-07-28 ENCOUNTER — Other Ambulatory Visit: Payer: Self-pay | Admitting: Adult Health

## 2013-07-31 ENCOUNTER — Ambulatory Visit: Payer: Medicare Other | Admitting: Physical Therapy

## 2013-08-09 ENCOUNTER — Ambulatory Visit: Payer: Medicare Other | Admitting: Physical Therapy

## 2013-08-23 ENCOUNTER — Ambulatory Visit: Payer: Medicare Other | Admitting: Physical Therapy

## 2013-08-27 ENCOUNTER — Ambulatory Visit: Payer: Medicare Other | Attending: Specialist | Admitting: Physical Therapy

## 2013-08-27 DIAGNOSIS — I10 Essential (primary) hypertension: Secondary | ICD-10-CM | POA: Insufficient documentation

## 2013-08-27 DIAGNOSIS — M25519 Pain in unspecified shoulder: Secondary | ICD-10-CM | POA: Insufficient documentation

## 2013-08-27 DIAGNOSIS — M25619 Stiffness of unspecified shoulder, not elsewhere classified: Secondary | ICD-10-CM | POA: Insufficient documentation

## 2013-08-27 DIAGNOSIS — E119 Type 2 diabetes mellitus without complications: Secondary | ICD-10-CM | POA: Insufficient documentation

## 2013-08-27 DIAGNOSIS — IMO0001 Reserved for inherently not codable concepts without codable children: Secondary | ICD-10-CM | POA: Insufficient documentation

## 2013-09-03 ENCOUNTER — Ambulatory Visit: Payer: Medicare Other | Admitting: Physical Therapy

## 2013-09-06 ENCOUNTER — Ambulatory Visit: Payer: Medicare Other | Admitting: Physical Therapy

## 2013-09-10 ENCOUNTER — Other Ambulatory Visit: Payer: Self-pay | Admitting: Adult Health

## 2013-09-18 ENCOUNTER — Other Ambulatory Visit: Payer: Self-pay | Admitting: Adult Health

## 2013-10-11 ENCOUNTER — Other Ambulatory Visit: Payer: Self-pay | Admitting: Orthopedic Surgery

## 2013-10-16 ENCOUNTER — Encounter (HOSPITAL_COMMUNITY): Payer: Self-pay | Admitting: Pharmacy Technician

## 2013-10-22 ENCOUNTER — Other Ambulatory Visit: Payer: Self-pay | Admitting: Orthopedic Surgery

## 2013-10-23 ENCOUNTER — Encounter (HOSPITAL_COMMUNITY): Payer: Self-pay

## 2013-10-23 ENCOUNTER — Encounter (HOSPITAL_COMMUNITY)
Admission: RE | Admit: 2013-10-23 | Discharge: 2013-10-23 | Disposition: A | Discharge status: Home / Self Care | Payer: Medicare Other | Source: Ambulatory Visit | Attending: Orthopedic Surgery | Admitting: Orthopedic Surgery

## 2013-10-23 DIAGNOSIS — Z01812 Encounter for preprocedural laboratory examination: Secondary | ICD-10-CM | POA: Insufficient documentation

## 2013-10-23 DIAGNOSIS — IMO0001 Reserved for inherently not codable concepts without codable children: Secondary | ICD-10-CM | POA: Insufficient documentation

## 2013-10-23 DIAGNOSIS — Z01818 Encounter for other preprocedural examination: Secondary | ICD-10-CM | POA: Insufficient documentation

## 2013-10-23 DIAGNOSIS — I10 Essential (primary) hypertension: Secondary | ICD-10-CM | POA: Insufficient documentation

## 2013-10-23 DIAGNOSIS — E119 Type 2 diabetes mellitus without complications: Secondary | ICD-10-CM | POA: Insufficient documentation

## 2013-10-23 DIAGNOSIS — M1710 Unilateral primary osteoarthritis, unspecified knee: Secondary | ICD-10-CM | POA: Insufficient documentation

## 2013-10-23 HISTORY — DX: Palpitations: R00.2

## 2013-10-23 LAB — COMPREHENSIVE METABOLIC PANEL
ALT: 9 U/L (ref 0–35)
AST: 11 U/L (ref 0–37)
Albumin: 4.1 g/dL (ref 3.5–5.2)
Alkaline Phosphatase: 84 U/L (ref 39–117)
Anion gap: 13 (ref 5–15)
BUN: 17 mg/dL (ref 6–23)
CO2: 27 mEq/L (ref 19–32)
Calcium: 10.2 mg/dL (ref 8.4–10.5)
Chloride: 98 mEq/L (ref 96–112)
Creatinine, Ser: 1.14 mg/dL — ABNORMAL HIGH (ref 0.50–1.10)
GFR calc Af Amer: 56 mL/min — ABNORMAL LOW (ref >90)
GFR calc non Af Amer: 48 mL/min — ABNORMAL LOW (ref >90)
Glucose, Bld: 134 mg/dL — ABNORMAL HIGH (ref 70–99)
Potassium: 4.6 mEq/L (ref 3.7–5.3)
Sodium: 138 mEq/L (ref 137–147)
Total Bilirubin: 0.3 mg/dL (ref 0.3–1.2)
Total Protein: 7.5 g/dL (ref 6.0–8.3)

## 2013-10-23 LAB — SURGICAL PCR SCREEN
MRSA, PCR: NEGATIVE
Staphylococcus aureus: NEGATIVE

## 2013-10-23 LAB — CBC
HCT: 33.7 % — ABNORMAL LOW (ref 36.0–46.0)
Hemoglobin: 11.1 g/dL — ABNORMAL LOW (ref 12.0–15.0)
MCH: 28.2 pg (ref 26.0–34.0)
MCHC: 32.9 g/dL (ref 30.0–36.0)
MCV: 85.5 fL (ref 78.0–100.0)
Platelets: 385 10*3/uL (ref 150–400)
RBC: 3.94 MIL/uL (ref 3.87–5.11)
RDW: 14.7 % (ref 11.5–15.5)
WBC: 6.5 10*3/uL (ref 4.0–10.5)

## 2013-10-23 LAB — URINALYSIS, ROUTINE W REFLEX MICROSCOPIC
Bilirubin Urine: NEGATIVE
Glucose, UA: NEGATIVE mg/dL
Hgb urine dipstick: NEGATIVE
Ketones, ur: NEGATIVE mg/dL
Nitrite: NEGATIVE
Protein, ur: NEGATIVE mg/dL
Specific Gravity, Urine: 1.021 (ref 1.005–1.030)
Urobilinogen, UA: 0.2 mg/dL (ref 0.0–1.0)
pH: 5 (ref 5.0–8.0)

## 2013-10-23 LAB — PROTIME-INR
INR: 0.93 (ref 0.00–1.49)
Prothrombin Time: 12.5 seconds (ref 11.6–15.2)

## 2013-10-23 LAB — URINE MICROSCOPIC-ADD ON

## 2013-10-23 LAB — APTT: aPTT: 28 seconds (ref 24–37)

## 2013-10-23 NOTE — Patient Instructions (Addendum)
Vanessa Frank  10/23/2013                           YOUR PROCEDURE IS SCHEDULED ON: 10/29/13 at 9:25 AM               ENTER THRU [facility] MAIN [facility]OS[facility]ITAL ENTRANCE AND                            FOLLOW  SIGNS TO SHORT STAY CENTER                 ARRIVE AT SHORT STAY AT:  6:30 AM               CALL THIS NUMBER IF ANY PROBLEMS THE DAY OF SURGERY :               [phone]                                REMEMBER:   Do not eat food or drink liquids AFTER MIDNIGHT                 Take these medicines the morning of surgery with               A SIPS OF WATER :    ZOLOFT / NEXIUM / PERCOCET IF NEEDED     Do not wear jewelry, make-up   Do not wear lotions, powders, or perfumes.   Do not shave legs or underarms 12 hrs. before surgery (men may shave face)  Do not bring valuables to the hospital.  Contacts, dentures or bridgework may not be worn into surgery.  Leave suitcase in the car. After surgery it may be brought to your room.  For patients admitted to the hospital more than one night, checkout time is            11:00 AM                                                       ________________________________________________________________________                                                                        [facility] - PREPARING FOR SURGERY  Before surgery, you can play an important role.  Because skin is not sterile, your skin needs to be as free of germs as possible.  You can reduce the number of germs on your skin by washing with CHG (chlorahexidine gluconate) soap before surgery.  CHG is an antiseptic cleaner which kills germs and bonds with the skin to continue killing germs even after washing. Please DO NOT use if you have an allergy to CHG or antibacterial soaps.  If your skin becomes reddened/irritated stop using the CHG and inform your nurse when you arrive at Short Stay. Do not shave (including legs and underarms) for at least 48 hours prior to the  first CHG shower.  You may shave your face. Please follow these instructions carefully:   1.  Shower with CHG Soap the night before surgery and the  morning of Surgery.   2.  If you choose to wash your hair, wash your hair first as usual with your  normal  Shampoo.   3.  After you shampoo, rinse your hair and body thoroughly to remove the  shampoo.                                         4.  Use CHG as you would any other liquid soap.  You can apply chg directly  to the skin and wash . Gently wash with scrungie or clean wascloth    5.  Apply the CHG Soap to your body ONLY FROM THE NECK DOWN.   Do not use on open                           Wound or open sores. Avoid contact with eyes, ears mouth and genitals (private parts).                        Genitals (private parts) with your normal soap.              6.  Wash thoroughly, paying special attention to the area where your surgery  will be performed.   7.  Thoroughly rinse your body with warm water from the neck down.   8.  DO NOT shower/wash with your normal soap after using and rinsing off  the CHG Soap .                9.  Pat yourself dry with a clean towel.             10.  Wear clean pajamas.             11.  Place clean sheets on your bed the night of your first shower and do not  sleep with pets.  Day of Surgery : Do not apply any lotions/deodorants the morning of surgery.  Please wear clean clothes to the hospital/surgery center.  FAILURE TO FOLLOW THESE INSTRUCTIONS MAY RESULT IN THE CANCELLATION OF YOUR SURGERY    PATIENT SIGNATURE_________________________________  ______________________________________________________________________     Vanessa Frank  An incentive Frank is a tool that can help keep your lungs clear and active. This tool measures how well you are filling your lungs with each breath. Taking long deep breaths may help reverse or decrease the chance of developing breathing (pulmonary)  problems (especially infection) following:  A long period of time when you are unable to move or be active. BEFORE THE PROCEDURE   If the Frank includes an indicator to show your best effort, your nurse or respiratory therapist will set it to a desired goal.  If possible, sit up straight or lean slightly forward. Try not to slouch.  Hold the incentive Frank in an upright position. INSTRUCTIONS FOR USE  1. Sit on the edge of your bed if possible, or sit up as far as you can in bed or on a chair. 2. Hold the incentive Frank in an upright position. 3. Breathe out normally. 4. Place the mouthpiece in your mouth and seal your lips tightly around it. 5. Breathe  in slowly and as deeply as possible, raising the piston or the ball toward the top of the column. 6. Hold your breath for 3-5 seconds or for as long as possible. Allow the piston or ball to fall to the bottom of the column. 7. Remove the mouthpiece from your mouth and breathe out normally. 8. Rest for a few seconds and repeat Steps 1 through 7 at least 10 times every 1-2 hours when you are awake. Take your time and take a few normal breaths between deep breaths. 9. The Frank may include an indicator to show your best effort. Use the indicator as a goal to work toward during each repetition. 10. After each set of 10 deep breaths, practice coughing to be sure your lungs are clear. If you have an incision (the cut made at the time of surgery), support your incision when coughing by placing a pillow or rolled up towels firmly against it. Once you are able to get out of bed, walk around indoors and cough well. You may stop using the incentive Frank when instructed by your caregiver.  RISKS AND COMPLICATIONS  Take your time so you do not get dizzy or light-headed.  If you are in pain, you may need to take or ask for pain medication before doing incentive spirometry. It is harder to take a deep breath if you are having  pain. AFTER USE  Rest and breathe slowly and easily.  It can be helpful to keep track of a log of your progress. Your caregiver can provide you with a simple table to help with this. If you are using the Frank at home, follow these instructions: SEEK MEDICAL CARE IF:   You are having difficultly using the Frank.  You have trouble using the Frank as often as instructed.  Your pain medication is not giving enough relief while using the Frank.  You develop fever of 100.5 F (38.1 C) or higher. SEEK IMMEDIATE MEDICAL CARE IF:   You cough up bloody sputum that had not been present before.  You develop fever of 102 F (38.9 C) or greater.  You develop worsening pain at or near the incision site. MAKE SURE YOU:   Understand these instructions.  Will watch your condition.  Will get help right away if you are not doing well or get worse. Document Released: 08/16/2006 Document Revised: 06/28/2011 Document Reviewed: 10/17/2006 ExitCare Patient Information 2014 ExitCare, [place].   ________________________________________________________________________  WHAT IS A BLOOD TRANSFUSION? Blood Transfusion Information  A transfusion is the replacement of blood or some of its parts. Blood is made up of multiple cells which provide different functions.  Red blood cells carry oxygen and are used for blood loss replacement.  White blood cells fight against infection.  Platelets control bleeding.  Plasma helps clot blood.  Other blood products are available for specialized needs, such as hemophilia or other clotting disorders. BEFORE THE TRANSFUSION  Who gives blood for transfusions?   Healthy volunteers who are fully evaluated to make sure their blood is safe. This is blood bank blood. Transfusion therapy is the safest it has ever been in the practice of medicine. Before blood is taken from a donor, a complete history is taken to make sure that person has no history  of diseases nor engages in risky social behavior (examples are intravenous drug use or sexual activity with multiple partners). The donor's travel history is screened to minimize risk of transmitting infections, such as malaria. The donated blood is tested  for signs of infectious diseases, such as HIV and hepatitis. The blood is then tested to be sure it is compatible with you in order to minimize the chance of a transfusion reaction. If you or a relative donates blood, this is often done in anticipation of surgery and is not appropriate for emergency situations. It takes many days to process the donated blood. RISKS AND COMPLICATIONS Although transfusion therapy is very safe and saves many lives, the main dangers of transfusion include:   Getting an infectious disease.  Developing a transfusion reaction. This is an allergic reaction to something in the blood you were given. Every precaution is taken to prevent this. The decision to have a blood transfusion has been considered carefully by your caregiver before blood is given. Blood is not given unless the benefits outweigh the risks. AFTER THE TRANSFUSION  Right after receiving a blood transfusion, you will usually feel much better and more energetic. This is especially true if your red blood cells have gotten low (anemic). The transfusion raises the level of the red blood cells which carry oxygen, and this usually causes an energy increase.  The nurse administering the transfusion will monitor you carefully for complications. HOME CARE INSTRUCTIONS  No special instructions are needed after a transfusion. You may find your energy is better. Speak with your caregiver about any limitations on activity for underlying diseases you may have. SEEK MEDICAL CARE IF:   Your condition is not improving after your transfusion.  You develop redness or irritation at the intravenous (IV) site. SEEK IMMEDIATE MEDICAL CARE IF:  Any of the following symptoms  occur over the next 12 hours:  Shaking chills.  You have a temperature by mouth above 102 F (38.9 C), not controlled by medicine.  Chest, back, or muscle pain.  People around you feel you are not acting correctly or are confused.  Shortness of breath or difficulty breathing.  Dizziness and fainting.  You get a rash or develop hives.  You have a decrease in urine output.  Your urine turns a dark color or changes to pink, red, or brown. Any of the following symptoms occur over the next 10 days:  You have a temperature by mouth above 102 F (38.9 C), not controlled by medicine.  Shortness of breath.  Weakness after normal activity.  The white part of the eye turns yellow (jaundice).  You have a decrease in the amount of urine or are urinating less often.  Your urine turns a dark color or changes to pink, red, or brown. Document Released: 04/02/2000 Document Revised: 06/28/2011 Document Reviewed: 11/20/2007 [facility] Patient Information 2014 [place], [place].  _______________________________________________________________________

## 2013-10-23 NOTE — Progress Notes (Signed)
10/23/13 1449  OBSTRUCTIVE SLEEP APNEA  Have you ever been diagnosed with sleep apnea through a sleep study? No  Do you snore loudly (loud enough to be heard through closed doors)?  0  Do you often feel tired, fatigued, or sleepy during the daytime? 1  Has anyone observed you stop breathing during your sleep? 0  Do you have, or are you being treated for high blood pressure? 1  BMI more than 35 kg/m2? 1  Age over 70 years old? 1  Neck circumference greater than 40 cm/16 inches? 0  Gender: 0  Obstructive Sleep Apnea Score 4  Score 4 or greater  Results sent to PCP

## 2013-10-25 ENCOUNTER — Other Ambulatory Visit: Payer: Self-pay | Admitting: Orthopedic Surgery

## 2013-10-26 NOTE — Progress Notes (Signed)
Request for review of op permit wording requested x2

## 2013-10-28 ENCOUNTER — Other Ambulatory Visit: Payer: Self-pay | Admitting: Orthopedic Surgery

## 2013-10-28 NOTE — H&P (Signed)
Vanessa Frank. Fenster DOB: 1945-10-06 Married / Language: English / Race: White Female  Date of Admission:  10-29-2013  Chief Complaint:  Right Knee Pain  History of Present Illness  The patient is a 68 year old female who comes in today for a preoperative history and physical. The patient is scheduled for a right total knee arthroplasty to be performed by Dr. Gus Rankin. Aluisio, MD at [facility] on 10-29-2013. The patient is a 68 year old female presenting for a post-operative visit. The patient comes in today 5 weeks out from left total knee arthroplasty. The patient states that he/she is getting better at this time. The pain is under fair control at this time and describe their pain as mild to moderate (soreness). They are currently on Oxycodone for their pain. The patient is currently doing home physical therapy. The patient feels that they are progressing well at this time. She is very pleased with how her left knee has done. She was still doing home therapy and feels as though the knee has made excellent progress. She is having a lot of trouble with her right knee and she said the right knee hurts a lot more than the left. She has documented advanced arthritis of that knee and has had injections in the past. Injections have not provided any long term benefit. She is recovering from the left total knee replacement and now ready to proceed with the right total knee replkacement. They have been treated conservatively in the past for the above stated problem and despite conservative measures, they continue to have progressive pain and severe functional limitations and dysfunction. They have failed non-operative management including home exercise, medications, and injections. It is felt that they would benefit from undergoing total joint replacement. Risks and benefits of the procedure have been discussed with the patient and they elect to proceed with surgery. There are no active contraindications to  surgery such as ongoing infection or rapidly progressive neurological disease.   Allergies  No Known Drug Allergies02/03/2012 sensitivity to codeine - Patient IS able to take oxycodone and hydrocodone   Problem List/Past Medical (Vanessa Frank, [name]; 10/17/2013 2:38 PM) Primary osteoarthritis of left knee (715.16  M17.12) S/P Left total knee arthroplasty (V43.65) Adhesive capsulitis (726.0) Right shoulder pain (719.41  M25.511) Impingement syndrome of shoulder (726.2) Urinary Incontinence Hemorrhoids Gastroesophageal Reflux Disease Rheumatoid Arthritis Menopause Diabetes Mellitus, Type II Fibromyalgia Osteoporosis Degenerative Disc Disease Chronic Pain Varicose veins Depression Shingles High blood pressure Osteoarthritis Anxiety Disorder Impaired Memory Impaired Vision Impaired Hearing Bronchitis Past History Arrhythmia Past History of "Irregular Heart Beat"   Family History (Vanessa Frank, [name]; 10/17/2013 2:17 PM) Congestive Heart Failure First Degree Relatives. mother Cancer First Degree Relatives. mother and father Depression mother and grandmother mothers side mother, sister and grandmother mothers side Siblings Deceased. Sister, age 55, Blood Clots Mother Deceased. age 39 Drug / Alcohol Addiction mother Heart Disease mother Severe allergy mother Hypertension mother Father Deceased, Cancer. 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 never smoker Previously in rehab no Illicit drug use no Drug/Alcohol Rehab  (Currently) no Exercise Exercises rarely; does other Marital status married Current  work status retired Alcohol use current drinker; drinks wine; only occasionally per week Living situation live with spouse Drug/Alcohol Rehab (Previously) no Post-Surgical Plans Skilled Rehab - [facility]  Medication History (Vanessa Rutterlezandrew L Pa Tennant, [name]; 10/17/2013 2:17 PM) Robaxin (prn) Active. Aspirin 81mg  Active. Butalbital-ASA-Caff-Codeine (50-325-40-30MG  Capsule, Oral) Active. NexIUM (20MG  Capsule DR, Oral) Active. Vitamin B12 (100MCG Tablet, Oral) Active. NexIUM (Oral) Specific dose unknown - Active. Calcium Carbonate (600MG  Tablet, Oral) Active. Actos (30MG  Tablet, Oral) Active. Oxycodone-Acetaminophen (5-325MG  Tablet, Oral) Active. Lipitor (10MG  Tablet, Oral) Active. Furosemide (40MG  Tablet, Oral) Active. Lisinopril (10MG  Tablet, Oral) Active. MetFORMIN HCl (1000MG  Tablet, Oral) Active. Sertraline HCl (100MG  Tablet, Oral) Active. Metoclopramide HCl (10MG  Tablet, Oral) Active.  Pregnancy / Birth History Vanessa Frank(Vanessa Frank, [name]; 10/17/2013 2:17 PM) Pregnant no  Past Surgical History Vanessa Frank(Vanessa Frank, [name]; 10/17/2013 2:29 PM) Arthroscopy of Knee Date: 2007. right bilateral Spinal Fusion Date: 2012. lower back Bartholian Gland Cyst Removal Date: 671983. Gallbladder Surgery Date: 1989. open Hysterectomy Date: 541980. Partial Foot Surgery right Spinal Surgery Total Knee Replacement - Left Date: 05/2013.   Review of Systems (Vanessa L. Glen Kesinger [name]; 10/17/2013 2:31 PM) General Present- Fatigue, Memory Loss and Night Sweats. Not Present- Chills, Fever, Weight Gain and Weight Loss. Skin Not Present- Eczema, Hives, Itching, Lesions and Rash. HEENT Not Present- Dentures, Double Vision, Headache, Hearing Loss, Tinnitus and Visual Loss. Respiratory Present- Allergies and Shortness of breath with exertion. Not Present- Chronic Cough, Coughing up  blood and Shortness of breath at rest. Cardiovascular Present- Palpitations. Not Present- Chest Pain, Difficulty Breathing Lying Down, Murmur, Racing/skipping heartbeats and Swelling. Gastrointestinal Present- Diarrhea, Heartburn and Vomiting. Not Present- Abdominal Pain, Bloody Stool, Constipation, Difficulty Swallowing, Jaundice, Loss of appetitie and Nausea. Female Genitourinary Present- Incontinence, Urinating at Night and Weak urinary stream. Not Present- Blood in Urine, Discharge, Flank Pain, Painful Urination, Urgency, Urinary frequency and Urinary Retention. Musculoskeletal Present- Back Pain, Joint Pain and Morning Stiffness. Not Present- Joint Swelling, Muscle Pain, Muscle Weakness and Spasms. Neurological Not Present- Blackout spells, Difficulty with balance, Dizziness, Paralysis, Tremor and Weakness. Psychiatric Not Present- Insomnia.   Vitals  Weight: 224 lb Height: 62in Weight was reported by patient. Height was reported by patient. Body Surface Area: 2.11 m Body Mass Index: 40.97 kg/m Pulse: 72 (Regular)  Resp.: 16 (Unlabored)  BP: 110/64 (Sitting, Right Arm, Standard)    Physical Exam  General Mental Status -Alert, cooperative and good historian. General Appearance-pleasant, Not in acute distress. Orientation-Oriented X3. Build & Nutrition-Well nourished and Well developed.  Head and Neck Head-normocephalic, atraumatic . Neck Global Assessment - supple, no bruit auscultated on the right, no bruit auscultated on the left.  Eye Vision-Wears corrective lenses. Pupil - Bilateral-Regular and Round. Motion - Bilateral-EOMI.  Chest and Lung Exam Auscultation Breath sounds - clear at anterior chest wall and clear at posterior chest wall. Adventitious sounds - No Adventitious sounds.  Cardiovascular Auscultation Rhythm - Regular rate and rhythm. Heart Sounds - S1 WNL and S2 WNL. Murmurs & Other Heart Sounds - Auscultation of the heart  reveals - No Murmurs.  Abdomen Inspection Contour - Generalized mild distention. Palpation/Percussion Tenderness - Abdomen is non-tender to palpation. Rigidity (guarding) - Abdomen is soft. Auscultation Auscultation of the abdomen reveals - Bowel sounds normal.  Female Genitourinary Note: Not done, not pertinent to present illness   Musculoskeletal Note: Extremities: Her left knee looks outstanding. There is no swelling. Her range is 0 to 110 actively. There is no tenderness or instability. Her right  knee shows no effusion. There is a varus deformity. There is marked crepitus on range of motion. Range about 5 to 125. She is tender medial greater than lateral with no instability noted.  X-RAYS: Left knee AP and lateral show her prosthesis in excellent position with no periprosthetic abnormalities. Right knee shows just about bone on bone medial and patellofemoral.    Assessment & Plan (Vanessa L. Bryony Kaman [name]; 10/17/2013 2:27 PM) Primary osteoarthritis of right knee (715.16  M17.11) Impression: Right Knee Note:Plan is for a Right Total Knee Replacement by Dr. Lequita Halt.  Plan is to go to [facility] again following this knee replacement.  PCP - Dr. Theressa Millard  The patient does not have any contraindications and will receive TXA (tranexamic acid) prior to surgery.   Signed electronically by Vanessa Frank, [name]

## 2013-10-29 ENCOUNTER — Encounter (HOSPITAL_COMMUNITY)
Admission: RE | Disposition: A | Discharge status: Skilled Nursing Facility | Payer: Self-pay | Source: Ambulatory Visit | Attending: Orthopedic Surgery

## 2013-10-29 ENCOUNTER — Inpatient Hospital Stay (HOSPITAL_COMMUNITY)
Admission: RE | Admit: 2013-10-29 | Discharge: 2013-11-01 | DRG: 470 | Disposition: A | Discharge status: Skilled Nursing Facility | Payer: Medicare Other | Source: Ambulatory Visit | Attending: Orthopedic Surgery | Admitting: Orthopedic Surgery

## 2013-10-29 ENCOUNTER — Inpatient Hospital Stay (HOSPITAL_COMMUNITY): Payer: Medicare Other | Admitting: Registered Nurse

## 2013-10-29 ENCOUNTER — Encounter (HOSPITAL_COMMUNITY): Payer: Self-pay | Admitting: *Deleted

## 2013-10-29 ENCOUNTER — Encounter (HOSPITAL_COMMUNITY): Payer: Medicare Other | Admitting: Registered Nurse

## 2013-10-29 DIAGNOSIS — F329 Major depressive disorder, single episode, unspecified: Secondary | ICD-10-CM

## 2013-10-29 DIAGNOSIS — M179 Osteoarthritis of knee, unspecified: Secondary | ICD-10-CM | POA: Diagnosis present

## 2013-10-29 DIAGNOSIS — F411 Generalized anxiety disorder: Secondary | ICD-10-CM | POA: Diagnosis not present

## 2013-10-29 DIAGNOSIS — H9190 Unspecified hearing loss, unspecified ear: Secondary | ICD-10-CM | POA: Diagnosis present

## 2013-10-29 DIAGNOSIS — Z6841 Body Mass Index (BMI) 40.0 and over, adult: Secondary | ICD-10-CM

## 2013-10-29 DIAGNOSIS — K219 Gastro-esophageal reflux disease without esophagitis: Secondary | ICD-10-CM

## 2013-10-29 DIAGNOSIS — I10 Essential (primary) hypertension: Secondary | ICD-10-CM

## 2013-10-29 DIAGNOSIS — Z01812 Encounter for preprocedural laboratory examination: Secondary | ICD-10-CM

## 2013-10-29 DIAGNOSIS — E871 Hypo-osmolality and hyponatremia: Secondary | ICD-10-CM

## 2013-10-29 DIAGNOSIS — F32A Depression, unspecified: Secondary | ICD-10-CM

## 2013-10-29 DIAGNOSIS — M1710 Unilateral primary osteoarthritis, unspecified knee: Secondary | ICD-10-CM | POA: Diagnosis not present

## 2013-10-29 DIAGNOSIS — IMO0001 Reserved for inherently not codable concepts without codable children: Secondary | ICD-10-CM | POA: Diagnosis present

## 2013-10-29 DIAGNOSIS — D62 Acute posthemorrhagic anemia: Secondary | ICD-10-CM

## 2013-10-29 DIAGNOSIS — E785 Hyperlipidemia, unspecified: Secondary | ICD-10-CM

## 2013-10-29 DIAGNOSIS — E119 Type 2 diabetes mellitus without complications: Secondary | ICD-10-CM | POA: Diagnosis present

## 2013-10-29 DIAGNOSIS — N393 Stress incontinence (female) (male): Secondary | ICD-10-CM | POA: Diagnosis present

## 2013-10-29 DIAGNOSIS — M1711 Unilateral primary osteoarthritis, right knee: Secondary | ICD-10-CM

## 2013-10-29 DIAGNOSIS — Z96659 Presence of unspecified artificial knee joint: Secondary | ICD-10-CM

## 2013-10-29 HISTORY — PX: TOTAL KNEE ARTHROPLASTY: SHX125

## 2013-10-29 LAB — GLUCOSE, CAPILLARY
Glucose-Capillary: 126 mg/dL — ABNORMAL HIGH (ref 70–99)
Glucose-Capillary: 156 mg/dL — ABNORMAL HIGH (ref 70–99)
Glucose-Capillary: 225 mg/dL — ABNORMAL HIGH (ref 70–99)
Glucose-Capillary: 188 mg/dL — ABNORMAL HIGH (ref 70–99)

## 2013-10-29 LAB — TYPE AND SCREEN
ABO/RH(D): O NEG
Antibody Screen: NEGATIVE

## 2013-10-29 SURGERY — ARTHROPLASTY, KNEE, TOTAL
Anesthesia: General | Site: Knee | Laterality: Right

## 2013-10-29 MED ORDER — ATORVASTATIN CALCIUM 10 MG PO TABS
10.0000 mg | ORAL_TABLET | Freq: Every day | ORAL | Status: DC
  Administered 2013-10-29 – 2013-10-31 (×3): 10 mg via ORAL
  Filled 2013-10-29 (×4): qty 1

## 2013-10-29 MED ORDER — HYDROMORPHONE HCL PF 1 MG/ML IJ SOLN
0.2500 mg | INTRAMUSCULAR | Status: DC | PRN
  Administered 2013-10-29 (×4): 0.5 mg via INTRAVENOUS

## 2013-10-29 MED ORDER — ONDANSETRON HCL 4 MG PO TABS: 4.0000 mg | ORAL_TABLET | Freq: Four times a day (QID) | ORAL | Status: DC | PRN

## 2013-10-29 MED ORDER — DEXAMETHASONE 6 MG PO TABS
10.0000 mg | ORAL_TABLET | Freq: Every day | ORAL | Status: AC
  Administered 2013-10-30: 10 mg via ORAL
  Filled 2013-10-29: qty 1

## 2013-10-29 MED ORDER — DEXAMETHASONE SODIUM PHOSPHATE 10 MG/ML IJ SOLN
INTRAMUSCULAR | Status: AC
  Filled 2013-10-29: qty 1

## 2013-10-29 MED ORDER — METHOCARBAMOL 1000 MG/10ML IJ SOLN
500.0000 mg | Freq: Four times a day (QID) | INTRAVENOUS | Status: DC | PRN
  Administered 2013-10-29: 500 mg via INTRAVENOUS
  Filled 2013-10-29: qty 5

## 2013-10-29 MED ORDER — CARBOXYMETHYLCELLULOSE SODIUM 0.5 % OP SOLN: 1.0000 [drp] | Freq: Three times a day (TID) | OPHTHALMIC | Status: DC | PRN

## 2013-10-29 MED ORDER — DEXAMETHASONE SODIUM PHOSPHATE 10 MG/ML IJ SOLN
10.0000 mg | Freq: Once | INTRAMUSCULAR | Status: AC
  Administered 2013-10-29: 10 mg via INTRAVENOUS

## 2013-10-29 MED ORDER — METOCLOPRAMIDE HCL 10 MG PO TABS
10.0000 mg | ORAL_TABLET | Freq: Three times a day (TID) | ORAL | Status: DC
  Administered 2013-10-29 – 2013-11-01 (×5): 10 mg via ORAL
  Filled 2013-10-29 (×12): qty 1

## 2013-10-29 MED ORDER — SERTRALINE HCL 100 MG PO TABS
100.0000 mg | ORAL_TABLET | Freq: Every morning | ORAL | Status: DC
Start: 2013-10-30 — End: 2013-11-01
  Administered 2013-10-30 – 2013-11-01 (×3): 100 mg via ORAL
  Filled 2013-10-29 (×3): qty 1

## 2013-10-29 MED ORDER — PIOGLITAZONE HCL 30 MG PO TABS
30.0000 mg | ORAL_TABLET | Freq: Every morning | ORAL | Status: DC
  Administered 2013-10-30 – 2013-11-01 (×3): 30 mg via ORAL
  Filled 2013-10-29 (×3): qty 1

## 2013-10-29 MED ORDER — ACETAMINOPHEN 325 MG PO TABS
650.0000 mg | ORAL_TABLET | Freq: Four times a day (QID) | ORAL | Status: DC | PRN
Start: 2013-10-30 — End: 2013-11-01

## 2013-10-29 MED ORDER — GLYCOPYRROLATE 0.2 MG/ML IJ SOLN
INTRAMUSCULAR | Status: DC | PRN
  Administered 2013-10-29: .8 mg via INTRAVENOUS

## 2013-10-29 MED ORDER — LIDOCAINE HCL (CARDIAC) 20 MG/ML IV SOLN
INTRAVENOUS | Status: AC
  Filled 2013-10-29: qty 5

## 2013-10-29 MED ORDER — CEFAZOLIN SODIUM-DEXTROSE 2-3 GM-% IV SOLR
2.0000 g | INTRAVENOUS | Status: AC
  Administered 2013-10-29: 2 g via INTRAVENOUS

## 2013-10-29 MED ORDER — POLYETHYLENE GLYCOL 3350 17 G PO PACK: 17.0000 g | PACK | Freq: Every day | ORAL | Status: DC | PRN

## 2013-10-29 MED ORDER — HYDROMORPHONE HCL PF 1 MG/ML IJ SOLN
INTRAMUSCULAR | Status: AC
  Filled 2013-10-29: qty 1

## 2013-10-29 MED ORDER — MIDAZOLAM HCL 5 MG/5ML IJ SOLN
INTRAMUSCULAR | Status: DC | PRN
  Administered 2013-10-29: 2 mg via INTRAVENOUS

## 2013-10-29 MED ORDER — CHLORHEXIDINE GLUCONATE 4 % EX LIQD: 60.0000 mL | Freq: Once | CUTANEOUS | Status: DC

## 2013-10-29 MED ORDER — METOCLOPRAMIDE HCL 5 MG/ML IJ SOLN: 5.0000 mg | Freq: Three times a day (TID) | INTRAMUSCULAR | Status: DC | PRN

## 2013-10-29 MED ORDER — PHENOL 1.4 % MT LIQD: 1.0000 | OROMUCOSAL | Status: DC | PRN

## 2013-10-29 MED ORDER — OXYCODONE HCL 5 MG PO TABS
5.0000 mg | ORAL_TABLET | ORAL | Status: DC | PRN
  Administered 2013-10-29 – 2013-10-30 (×3): 10 mg via ORAL
  Administered 2013-10-30: 20 mg via ORAL
  Administered 2013-10-30 (×3): 10 mg via ORAL
  Administered 2013-10-30: 20 mg via ORAL
  Administered 2013-10-30: 10 mg via ORAL
  Administered 2013-10-30: 15 mg via ORAL
  Administered 2013-10-30: 10 mg via ORAL
  Administered 2013-10-31 – 2013-11-01 (×9): 20 mg via ORAL
  Filled 2013-10-29: qty 2
  Filled 2013-10-29 (×2): qty 4
  Filled 2013-10-29 (×2): qty 2
  Filled 2013-10-29 (×2): qty 4
  Filled 2013-10-29 (×2): qty 2
  Filled 2013-10-29: qty 4
  Filled 2013-10-29: qty 3
  Filled 2013-10-29 (×3): qty 4
  Filled 2013-10-29 (×2): qty 2
  Filled 2013-10-29 (×2): qty 4
  Filled 2013-10-29: qty 2
  Filled 2013-10-29: qty 4

## 2013-10-29 MED ORDER — BUPIVACAINE HCL (PF) 0.25 % IJ SOLN
INTRAMUSCULAR | Status: AC
  Filled 2013-10-29: qty 30

## 2013-10-29 MED ORDER — DIPHENHYDRAMINE HCL 12.5 MG/5ML PO ELIX: 12.5000 mg | ORAL_SOLUTION | ORAL | Status: DC | PRN

## 2013-10-29 MED ORDER — HYDROMORPHONE HCL PF 2 MG/ML IJ SOLN
INTRAMUSCULAR | Status: AC
  Filled 2013-10-29: qty 1

## 2013-10-29 MED ORDER — DOCUSATE SODIUM 100 MG PO CAPS
100.0000 mg | ORAL_CAPSULE | Freq: Two times a day (BID) | ORAL | Status: DC
  Administered 2013-10-29 – 2013-11-01 (×6): 100 mg via ORAL

## 2013-10-29 MED ORDER — MORPHINE SULFATE 10 MG/ML IJ SOLN
INTRAMUSCULAR | Status: AC
  Filled 2013-10-29: qty 1

## 2013-10-29 MED ORDER — METHOCARBAMOL 500 MG PO TABS
500.0000 mg | ORAL_TABLET | Freq: Four times a day (QID) | ORAL | Status: DC | PRN
  Administered 2013-10-29 – 2013-11-01 (×7): 500 mg via ORAL
  Filled 2013-10-29 (×7): qty 1

## 2013-10-29 MED ORDER — EPHEDRINE SULFATE 50 MG/ML IJ SOLN
INTRAMUSCULAR | Status: DC | PRN
  Administered 2013-10-29: 5 mg via INTRAVENOUS

## 2013-10-29 MED ORDER — RIVAROXABAN 10 MG PO TABS
10.0000 mg | ORAL_TABLET | Freq: Every day | ORAL | Status: DC
  Administered 2013-10-30 – 2013-11-01 (×3): 10 mg via ORAL
  Filled 2013-10-29 (×4): qty 1

## 2013-10-29 MED ORDER — HYDROMORPHONE HCL PF 1 MG/ML IJ SOLN
INTRAMUSCULAR | Status: DC | PRN
  Administered 2013-10-29 (×4): 0.5 mg via INTRAVENOUS

## 2013-10-29 MED ORDER — SUCCINYLCHOLINE CHLORIDE 20 MG/ML IJ SOLN
INTRAMUSCULAR | Status: DC | PRN
  Administered 2013-10-29: 100 mg via INTRAVENOUS

## 2013-10-29 MED ORDER — FENTANYL CITRATE 0.05 MG/ML IJ SOLN
INTRAMUSCULAR | Status: AC
  Filled 2013-10-29: qty 5

## 2013-10-29 MED ORDER — CEFAZOLIN SODIUM-DEXTROSE 2-3 GM-% IV SOLR
INTRAVENOUS | Status: AC
  Filled 2013-10-29: qty 50

## 2013-10-29 MED ORDER — PROPOFOL 10 MG/ML IV BOLUS
INTRAVENOUS | Status: DC | PRN
  Administered 2013-10-29: 200 mg via INTRAVENOUS

## 2013-10-29 MED ORDER — LACTATED RINGERS IV SOLN
INTRAVENOUS | Status: DC | PRN
  Administered 2013-10-29: 09:00:00 via INTRAVENOUS

## 2013-10-29 MED ORDER — PANTOPRAZOLE SODIUM 40 MG PO TBEC: 40.0000 mg | DELAYED_RELEASE_TABLET | Freq: Every day | ORAL | Status: DC

## 2013-10-29 MED ORDER — METFORMIN HCL 500 MG PO TABS
1000.0000 mg | ORAL_TABLET | Freq: Two times a day (BID) | ORAL | Status: DC
  Filled 2013-10-29 (×3): qty 2

## 2013-10-29 MED ORDER — CEFAZOLIN SODIUM-DEXTROSE 2-3 GM-% IV SOLR
2.0000 g | Freq: Four times a day (QID) | INTRAVENOUS | Status: AC
  Administered 2013-10-29 (×2): 2 g via INTRAVENOUS
  Filled 2013-10-29 (×2): qty 50

## 2013-10-29 MED ORDER — TRANEXAMIC ACID 100 MG/ML IV SOLN
1000.0000 mg | INTRAVENOUS | Status: AC
  Administered 2013-10-29: 1000 mg via INTRAVENOUS
  Filled 2013-10-29: qty 10

## 2013-10-29 MED ORDER — ONDANSETRON HCL 4 MG/2ML IJ SOLN
INTRAMUSCULAR | Status: DC | PRN
  Administered 2013-10-29: 4 mg via INTRAVENOUS

## 2013-10-29 MED ORDER — FENTANYL CITRATE 0.05 MG/ML IJ SOLN
INTRAMUSCULAR | Status: DC | PRN
  Administered 2013-10-29 (×2): 50 ug via INTRAVENOUS
  Administered 2013-10-29: 100 ug via INTRAVENOUS
  Administered 2013-10-29: 50 ug via INTRAVENOUS

## 2013-10-29 MED ORDER — SODIUM CHLORIDE 0.9 % IJ SOLN
INTRAMUSCULAR | Status: AC
  Filled 2013-10-29: qty 50

## 2013-10-29 MED ORDER — SODIUM CHLORIDE 0.9 % IR SOLN
Status: DC | PRN
  Administered 2013-10-29: 1000 mL

## 2013-10-29 MED ORDER — MORPHINE SULFATE 10 MG/ML IJ SOLN
1.0000 mg | INTRAMUSCULAR | Status: DC | PRN
  Administered 2013-10-29: 1 mg via INTRAVENOUS

## 2013-10-29 MED ORDER — POLYVINYL ALCOHOL 1.4 % OP SOLN
1.0000 [drp] | Freq: Three times a day (TID) | OPHTHALMIC | Status: DC | PRN
  Filled 2013-10-29: qty 15

## 2013-10-29 MED ORDER — MENTHOL 3 MG MT LOZG: 1.0000 | LOZENGE | OROMUCOSAL | Status: DC | PRN

## 2013-10-29 MED ORDER — LIDOCAINE HCL (CARDIAC) 20 MG/ML IV SOLN
INTRAVENOUS | Status: DC | PRN
  Administered 2013-10-29: 100 mg via INTRAVENOUS

## 2013-10-29 MED ORDER — ROCURONIUM BROMIDE 100 MG/10ML IV SOLN
INTRAVENOUS | Status: AC
  Filled 2013-10-29: qty 1

## 2013-10-29 MED ORDER — SODIUM CHLORIDE 0.9 % IJ SOLN
INTRAMUSCULAR | Status: DC | PRN
  Administered 2013-10-29: 30 mL via INTRAVENOUS

## 2013-10-29 MED ORDER — NEOSTIGMINE METHYLSULFATE 10 MG/10ML IV SOLN
INTRAVENOUS | Status: DC | PRN
  Administered 2013-10-29: 5 mg via INTRAVENOUS

## 2013-10-29 MED ORDER — DEXAMETHASONE SODIUM PHOSPHATE 10 MG/ML IJ SOLN
10.0000 mg | Freq: Every day | INTRAMUSCULAR | Status: AC
  Filled 2013-10-29: qty 1

## 2013-10-29 MED ORDER — INSULIN ASPART 100 UNIT/ML ~~LOC~~ SOLN
0.0000 [IU] | Freq: Three times a day (TID) | SUBCUTANEOUS | Status: DC
  Administered 2013-10-29: 5 [IU] via SUBCUTANEOUS
  Administered 2013-10-30 – 2013-10-31 (×5): 2 [IU] via SUBCUTANEOUS

## 2013-10-29 MED ORDER — ACETAMINOPHEN 10 MG/ML IV SOLN
1000.0000 mg | Freq: Once | INTRAVENOUS | Status: AC
  Administered 2013-10-29: 1000 mg via INTRAVENOUS
  Filled 2013-10-29: qty 100

## 2013-10-29 MED ORDER — FLEET ENEMA 7-19 GM/118ML RE ENEM: 1.0000 | ENEMA | Freq: Once | RECTAL | Status: AC | PRN

## 2013-10-29 MED ORDER — FUROSEMIDE 40 MG PO TABS
40.0000 mg | ORAL_TABLET | Freq: Every morning | ORAL | Status: DC
  Administered 2013-10-29 – 2013-11-01 (×4): 40 mg via ORAL
  Filled 2013-10-29 (×4): qty 1

## 2013-10-29 MED ORDER — PROPOFOL 10 MG/ML IV BOLUS
INTRAVENOUS | Status: AC
  Filled 2013-10-29: qty 20

## 2013-10-29 MED ORDER — BUPIVACAINE LIPOSOME 1.3 % IJ SUSP
INTRAMUSCULAR | Status: DC | PRN
  Administered 2013-10-29: 20 mL

## 2013-10-29 MED ORDER — MORPHINE SULFATE 2 MG/ML IJ SOLN
1.0000 mg | INTRAMUSCULAR | Status: DC | PRN
  Administered 2013-10-30: 2 mg via INTRAVENOUS
  Administered 2013-10-30: 1 mg via INTRAVENOUS
  Administered 2013-10-30: 2 mg via INTRAVENOUS
  Filled 2013-10-29 (×3): qty 1

## 2013-10-29 MED ORDER — MIDAZOLAM HCL 2 MG/2ML IJ SOLN
INTRAMUSCULAR | Status: AC
  Filled 2013-10-29: qty 2

## 2013-10-29 MED ORDER — PROMETHAZINE HCL 25 MG/ML IJ SOLN: 6.2500 mg | INTRAMUSCULAR | Status: DC | PRN

## 2013-10-29 MED ORDER — METOCLOPRAMIDE HCL 10 MG PO TABS
5.0000 mg | ORAL_TABLET | Freq: Three times a day (TID) | ORAL | Status: DC | PRN
  Filled 2013-10-29: qty 1

## 2013-10-29 MED ORDER — BUPIVACAINE HCL 0.25 % IJ SOLN
INTRAMUSCULAR | Status: DC | PRN
  Administered 2013-10-29: 30 mL

## 2013-10-29 MED ORDER — ONDANSETRON HCL 4 MG/2ML IJ SOLN: 4.0000 mg | Freq: Four times a day (QID) | INTRAMUSCULAR | Status: DC | PRN

## 2013-10-29 MED ORDER — ACETAMINOPHEN 650 MG RE SUPP: 650.0000 mg | Freq: Four times a day (QID) | RECTAL | Status: DC | PRN

## 2013-10-29 MED ORDER — BISACODYL 10 MG RE SUPP: 10.0000 mg | Freq: Every day | RECTAL | Status: DC | PRN

## 2013-10-29 MED ORDER — KETOROLAC TROMETHAMINE 15 MG/ML IJ SOLN: 7.5000 mg | Freq: Four times a day (QID) | INTRAMUSCULAR | Status: AC | PRN

## 2013-10-29 MED ORDER — ACETAMINOPHEN 500 MG PO TABS
1000.0000 mg | ORAL_TABLET | Freq: Four times a day (QID) | ORAL | Status: AC
  Administered 2013-10-29 – 2013-10-30 (×3): 1000 mg via ORAL
  Filled 2013-10-29 (×4): qty 2

## 2013-10-29 MED ORDER — BUPIVACAINE LIPOSOME 1.3 % IJ SUSP
20.0000 mL | Freq: Once | INTRAMUSCULAR | Status: DC
  Filled 2013-10-29: qty 20

## 2013-10-29 MED ORDER — ROCURONIUM BROMIDE 100 MG/10ML IV SOLN
INTRAVENOUS | Status: DC | PRN
  Administered 2013-10-29: 30 mg via INTRAVENOUS

## 2013-10-29 MED ORDER — SODIUM CHLORIDE 0.9 % IV SOLN
INTRAVENOUS | Status: DC
  Administered 2013-10-29: 12:00:00 via INTRAVENOUS

## 2013-10-29 MED ORDER — ONDANSETRON HCL 4 MG/2ML IJ SOLN
INTRAMUSCULAR | Status: AC
  Filled 2013-10-29: qty 2

## 2013-10-29 MED ORDER — SODIUM CHLORIDE 0.9 % IV SOLN: INTRAVENOUS | Status: DC

## 2013-10-29 SURGICAL SUPPLY — 62 items
BAG SPEC THK2 15X12 ZIP CLS (MISCELLANEOUS) ×1
BAG ZIPLOCK 12X15 (MISCELLANEOUS) ×2 IMPLANT
BANDAGE ELASTIC 6 VELCRO ST LF (GAUZE/BANDAGES/DRESSINGS) ×2 IMPLANT
BANDAGE ESMARK 6X9 LF (GAUZE/BANDAGES/DRESSINGS) ×1 IMPLANT
BLADE SAG 18X100X1.27 (BLADE) ×2 IMPLANT
BLADE SAW SGTL 11.0X1.19X90.0M (BLADE) ×2 IMPLANT
BNDG CMPR 9X6 STRL LF SNTH (GAUZE/BANDAGES/DRESSINGS) ×1
BNDG ESMARK 6X9 LF (GAUZE/BANDAGES/DRESSINGS) ×2
BOWL SMART MIX CTS (DISPOSABLE) ×2 IMPLANT
CAPT RP KNEE ×1 IMPLANT
CEMENT HV SMART SET (Cement) ×4 IMPLANT
CUFF TOURN SGL QUICK 34 (TOURNIQUET CUFF) ×2
CUFF TRNQT CYL 34X4X40X1 (TOURNIQUET CUFF) ×1 IMPLANT
DECANTER SPIKE VIAL GLASS SM (MISCELLANEOUS) ×2 IMPLANT
DRAPE EXTREMITY T 121X128X90 (DRAPE) ×2 IMPLANT
DRAPE POUCH INSTRU U-SHP 10X18 (DRAPES) ×2 IMPLANT
DRAPE U-SHAPE 47X51 STRL (DRAPES) ×2 IMPLANT
DRSG ADAPTIC 3X8 NADH LF (GAUZE/BANDAGES/DRESSINGS) ×2 IMPLANT
DRSG PAD ABDOMINAL 8X10 ST (GAUZE/BANDAGES/DRESSINGS) ×2 IMPLANT
DURAPREP 26ML APPLICATOR (WOUND CARE) ×2 IMPLANT
ELECT REM PT RETURN 9FT ADLT (ELECTROSURGICAL) ×2
ELECTRODE REM PT RTRN 9FT ADLT (ELECTROSURGICAL) ×1 IMPLANT
EVACUATOR 1/8 PVC DRAIN (DRAIN) ×2 IMPLANT
FACESHIELD WRAPAROUND (MASK) ×10 IMPLANT
FACESHIELD WRAPAROUND OR TEAM (MASK) ×5 IMPLANT
GAUZE SPONGE 4X4 12PLY STRL (GAUZE/BANDAGES/DRESSINGS) ×2 IMPLANT
GLOVE BIO SURGEON STRL SZ7.5 (GLOVE) IMPLANT
GLOVE BIO SURGEON STRL SZ8 (GLOVE) ×2 IMPLANT
GLOVE BIOGEL PI IND STRL 6.5 (GLOVE) IMPLANT
GLOVE BIOGEL PI IND STRL 8 (GLOVE) ×1 IMPLANT
GLOVE BIOGEL PI INDICATOR 6.5 (GLOVE)
GLOVE BIOGEL PI INDICATOR 8 (GLOVE) ×1
GLOVE SURG SS PI 6.5 STRL IVOR (GLOVE) IMPLANT
GOWN STRL REUS W/TWL LRG LVL3 (GOWN DISPOSABLE) ×2 IMPLANT
GOWN STRL REUS W/TWL XL LVL3 (GOWN DISPOSABLE) IMPLANT
HANDPIECE INTERPULSE COAX TIP (DISPOSABLE) ×2
IMMOBILIZER KNEE 20 (SOFTGOODS) ×3 IMPLANT
IMMOBILIZER KNEE 20 THIGH 36 (SOFTGOODS) ×1 IMPLANT
KIT BASIN OR (CUSTOM PROCEDURE TRAY) ×2 IMPLANT
MANIFOLD NEPTUNE II (INSTRUMENTS) ×2 IMPLANT
NDL SAFETY ECLIPSE 18X1.5 (NEEDLE) ×2 IMPLANT
NEEDLE HYPO 18GX1.5 SHARP (NEEDLE) ×4
NS IRRIG 1000ML POUR BTL (IV SOLUTION) ×2 IMPLANT
PACK TOTAL JOINT (CUSTOM PROCEDURE TRAY) ×2 IMPLANT
PAD ABD 8X10 STRL (GAUZE/BANDAGES/DRESSINGS) ×1 IMPLANT
PADDING CAST COTTON 6X4 STRL (CAST SUPPLIES) ×3 IMPLANT
POSITIONER SURGICAL ARM (MISCELLANEOUS) ×2 IMPLANT
SET HNDPC FAN SPRY TIP SCT (DISPOSABLE) ×1 IMPLANT
SPONGE GAUZE 4X4 12PLY (GAUZE/BANDAGES/DRESSINGS) ×1 IMPLANT
STRIP CLOSURE SKIN 1/2X4 (GAUZE/BANDAGES/DRESSINGS) ×3 IMPLANT
SUCTION FRAZIER 12FR DISP (SUCTIONS) ×2 IMPLANT
SUT MNCRL AB 4-0 PS2 18 (SUTURE) ×2 IMPLANT
SUT VIC AB 2-0 CT1 27 (SUTURE) ×6
SUT VIC AB 2-0 CT1 TAPERPNT 27 (SUTURE) ×3 IMPLANT
SUT VLOC 180 0 24IN GS25 (SUTURE) ×2 IMPLANT
SYRINGE 20CC LL (MISCELLANEOUS) ×2 IMPLANT
SYRINGE 60CC LL (MISCELLANEOUS) ×2 IMPLANT
TOWEL OR 17X26 10 PK STRL BLUE (TOWEL DISPOSABLE) ×2 IMPLANT
TOWEL OR NON WOVEN STRL DISP B (DISPOSABLE) IMPLANT
TRAY FOLEY CATH 14FRSI W/METER (CATHETERS) ×2 IMPLANT
WATER STERILE IRR 1500ML POUR (IV SOLUTION) ×2 IMPLANT
WRAP KNEE MAXI GEL POST OP (GAUZE/BANDAGES/DRESSINGS) ×2 IMPLANT

## 2013-10-29 NOTE — Progress Notes (Signed)
7/8 patient had a tooth filled and took Amoxicillin for this procedure

## 2013-10-29 NOTE — Evaluation (Signed)
Physical Therapy Evaluation Patient Details Name: Vanessa HanlonLinda R Frank MRN: [id number] DOB: Jul 25, 1945 Today's Date: 10/29/2013   History of Present Illness  s/p R TKA  Clinical Impression  Pt with RTKA , with recent LTKA presents with need for PT in order to eventually return home at safe level and modified independent level.     Follow Up Recommendations SNF    Equipment Recommendations  None recommended by PT    Recommendations for Other Services       Precautions / Restrictions Precautions Precautions: Knee Restrictions Weight Bearing Restrictions: No      Mobility  Bed Mobility Overal bed mobility: Needs Assistance Bed Mobility: Supine to Sit;Sit to Supine     Supine to sit: Min assist Sit to supine: Min assist   General bed mobility comments: assist with RLE   Transfers Overall transfer level: Needs assistance Equipment used: Rolling walker (2 wheeled) Transfers: Sit to/from Stand Sit to Stand: Min assist         General transfer comment: cues for RW sequencing and hand placement  Ambulation/Gait Ambulation/Gait assistance: Min guard Ambulation Distance (Feet): 40 Feet Assistive device: Rolling walker (2 wheeled) Gait Pattern/deviations: Step-to pattern     General Gait Details: cues for RW gait pattern and RW safety   Stairs            Wheelchair Mobility    Modified Rankin (Stroke Patients Only)       Balance                                             Pertinent Vitals/Pain 5/10 and nurse brought pain meds for pt. , iced after session as well.     Home Living Family/patient expects to be discharged to:: Skilled nursing facility (pt planning on going to [facility]) Living Arrangements: Spouse/significant other (spouse works full time)                    Prior Function Level of Independence: Independent         Comments: had done well with rehab after her other TKA and ambulating with NO asiistive  device.      Hand Dominance        Extremity/Trunk Assessment               Lower Extremity Assessment: RLE deficits/detail RLE Deficits / Details: general weakness due to s/p surgery adn knee flexion /ext grossly 0-45 in supine with bandage intact. Able to perform good quad set and help with SLR        Communication   Communication: No difficulties  Cognition Arousal/Alertness: Awake/alert Behavior During Therapy: WFL for tasks assessed/performed Overall Cognitive Status: Within Functional Limits for tasks assessed                      General Comments      Exercises Total Joint Exercises Ankle Circles/Pumps: AROM;Right;10 reps;Supine Quad Sets: AROM;Supine;Right;10 reps Heel Slides: AAROM;Right;5 reps Straight Leg Raises: AAROM;Right;Supine;10 reps      Assessment/Plan    PT Assessment Patient needs continued PT services  PT Diagnosis Difficulty walking   PT Problem List Decreased strength;Decreased range of motion;Decreased activity tolerance;Decreased balance;Decreased mobility  PT Treatment Interventions DME instruction;Gait training;Stair training;Functional mobility training;Therapeutic activities;Therapeutic exercise;Patient/family education   PT Goals (Current goals can be found in the Care Plan section) Acute  Rehab PT Goals Patient Stated Goal: To return home and be able to get back to driving and going places myself again  PT Goal Formulation: With patient Time For Goal Achievement: 11/12/13 Potential to Achieve Goals: Good    Frequency 7X/week   Barriers to discharge        Co-evaluation               End of Session Equipment Utilized During Treatment: Gait belt;Right knee immobilizer Activity Tolerance: Patient tolerated treatment well Patient left: in chair;with call bell/phone within reach Nurse Communication: Mobility status         Time: 1745-1820 PT Time Calculation (min): 35 min   Charges:   PT  Evaluation $Initial PT Evaluation Tier I: 1 Procedure PT Treatments $Gait Training: 8-22 mins $Therapeutic Exercise: 8-22 mins   PT G CodesMarella Bile 10/29/2013, 6:36 PM Marella Bile, PT Pager: [phone] 10/29/2013

## 2013-10-29 NOTE — Transfer of Care (Signed)
Immediate Anesthesia Transfer of Care Note  Patient: Vanessa Frank  Procedure(s) Performed: Procedure(s): RIGHT TOTAL KNEE ARTHROPLASTY (Right)  Patient Location: PACU  Anesthesia Type:General  Level of Consciousness: awake, alert , oriented and patient cooperative  Airway & Oxygen Therapy: Patient Spontanous Breathing and Patient connected to face mask oxygen  Post-op Assessment: Report given to PACU RN, Post -op Vital signs reviewed and stable and Patient moving all extremities  Post vital signs: Reviewed and stable  Complications: No apparent anesthesia complications

## 2013-10-29 NOTE — Progress Notes (Signed)
Clinical Social Work Department CLINICAL SOCIAL WORK PLACEMENT NOTE 10/29/2013  Patient:  Vanessa Frank,Vanessa Frank  Account Number:  [id number] Admit date:  10/29/2013  Clinical Social Worker:  Cori RazorJAMIE Karyssa Amaral, LCSW  Date/time:  10/29/2013 03:46 PM  Clinical Social Work is seeking post-discharge placement for this patient at the following level of care:   SKILLED NURSING   (*CSW will update this form in Epic as items are completed)     Patient/family provided with [facility] of Clinical [facility] Work's list of facilities offering this level of care within the geographic area requested by the patient (or if unable, by the patient's family).  10/29/2013  Patient/family informed of their freedom to choose among providers that offer the needed level of care, that participate in Medicare, Medicaid or managed care program needed by the patient, have an available bed and are willing to accept the patient.    Patient/family informed of [facility]' ownership interest in [facility], as well as of the fact that they are under no obligation to receive care at this facility.  PASARR submitted to EDS on 10/29/2013 PASARR number received on 10/29/2013  FL2 transmitted to all facilities in geographic area requested by pt/family on  10/29/2013 FL2 transmitted to all facilities within larger geographic area on   Patient informed that his/her managed care company has contracts with or will negotiate with  certain facilities, including the following:     Patient/family informed of bed offers received:  10/29/2013 [facility] Physician recommends and patient chooses bed at    [facility]   Patient to be transferred to facility by  Patient and family notified of transfer on  Name of family member notified:    The following physician request were entered in Epic:   Additional Comments:  Cori RazorJamie Mindel Friscia LCSW [phone]

## 2013-10-29 NOTE — Anesthesia Postprocedure Evaluation (Signed)
  Anesthesia Post-op Note  Patient: Vanessa HanlonLinda R Frank  Procedure(s) Performed: Procedure(s) (LRB): RIGHT TOTAL KNEE ARTHROPLASTY (Right)  Patient Location: PACU  Anesthesia Type: General  Level of Consciousness: awake and alert   Airway and Oxygen Therapy: Patient Spontanous Breathing  Post-op Pain: mild  Post-op Assessment: Post-op Vital signs reviewed, Patient's Cardiovascular Status Stable, Respiratory Function Stable, Patent Airway and No signs of Nausea or vomiting  Last Vitals:  Filed Vitals:   10/29/13 1230  BP: 132/53  Pulse: 75  Temp:   Resp: 9    Post-op Vital Signs: stable   Complications: No apparent anesthesia complications

## 2013-10-29 NOTE — Progress Notes (Signed)
Clinical Social Work Department BRIEF PSYCHOSOCIAL ASSESSMENT 10/29/2013  Patient:  Vanessa Frank, Vanessa Frank     Account Number:  [id number]     Admit date:  10/29/2013  Clinical Social Worker:  Lacie Scotts  Date/Time:  10/29/2013 03:39 PM  Referred by:  Physician  Date Referred:  10/29/2013 Referred for  SNF Placement   Other Referral:   Interview type:  Patient Other interview type:    PSYCHOSOCIAL DATA Living Status:  HUSBAND Admitted from facility:   Level of care:   Primary support name:  Vanessa Frank Primary support relationship to patient:  SPOUSE Degree of support available:   unclear    CURRENT CONCERNS Current Concerns  Post-Acute Placement   Other Concerns:    SOCIAL WORK ASSESSMENT / PLAN Pt is a 68 yr old female living at home prior to hospitalization. CSW met with pt to assist with d/c planning. This is planned admission. Pt has made prior arrangements to have ST Rehab at [facility] following hospital d/c. CSW has contacted SNF and d/c plans have been confirmed. CSW will continue to follow to assist with d/c planning to SNF.   Assessment/plan status:  Psychosocial Support/Ongoing Assessment of Needs Other assessment/ plan:   Information/referral to community resources:   Insurance coverage for SNF and ambulance transport reviewed.    PATIENT'S/FAMILY'S RESPONSE TO PLAN OF CARE: Pt's mood is bright. She is pleased surgery is over. Pt is motivated to begin therapy and is looking forward to having rehab at [facility].   Werner Lean LCSW [phone]

## 2013-10-29 NOTE — Anesthesia Preprocedure Evaluation (Addendum)
Anesthesia Evaluation  Patient identified by MRN, date of birth, ID band Patient awake    Reviewed: Allergy & Precautions, H&P , NPO status , Patient's Chart, lab work & pertinent test results  Airway Mallampati: II TM Distance: >3 FB Neck ROM: Full    Dental no notable dental hx.    Pulmonary neg pulmonary ROS,  breath sounds clear to auscultation  Pulmonary exam normal       Cardiovascular hypertension, Pt. on medications Rhythm:Regular Rate:Normal     Neuro/Psych negative neurological ROS  negative psych ROS   GI/Hepatic negative GI ROS, Neg liver ROS,   Endo/Other  diabetesMorbid obesity  Renal/GU negative Renal ROS  negative genitourinary   Musculoskeletal negative musculoskeletal ROS (+)   Abdominal   Peds negative pediatric ROS (+)  Hematology  (+) anemia ,   Anesthesia Other Findings   Reproductive/Obstetrics negative OB ROS                         Anesthesia Physical Anesthesia Plan  ASA: III  Anesthesia Plan: General   Post-op Pain Management:    Induction: Intravenous  Airway Management Planned: Oral ETT  Additional Equipment:   Intra-op Plan:   Post-operative Plan: Extubation in OR  Informed Consent: I have reviewed the patients History and Physical, chart, labs and discussed the procedure including the risks, benefits and alternatives for the proposed anesthesia with the patient or authorized representative who has indicated his/her understanding and acceptance.   Dental advisory given  Plan Discussed with: CRNA and Surgeon  Anesthesia Plan Comments:         Anesthesia Quick Evaluation

## 2013-10-29 NOTE — Op Note (Signed)
Pre-operative diagnosis- Osteoarthritis  Right knee(s)  Post-operative diagnosis- Osteoarthritis Right knee(s)  Procedure-  Right  Total Knee Arthroplasty  Surgeon- Gus Rankin. Damyon Mullane, MD  Assistant- Dimitri Ped, PA-C   Anesthesia-  General  EBL-* No blood loss amount entered *   Drains Hemovac  Tourniquet time-  35 minutes @ 300 mm Hg  Complications- None  Condition-PACU - hemodynamically stable.   Brief Clinical Note  Vanessa Frank is a 68 y.o. year old female with end stage OA of her right knee with progressively worsening pain and dysfunction. She has constant pain, with activity and at rest and significant functional deficits with difficulties even with ADLs. She has had extensive non-op management including analgesics, injections of cortisone and viscosupplements, and home exercise program, but remains in significant pain with significant dysfunction.Radiographs show bone on bone arthritis medial and patellofemoral. She presents now for right Total Knee Arthroplasty.    Procedure in detail---   The patient is brought into the operating room and positioned supine on the operating table. After successful administration of  General,   a tourniquet is placed high on the  Right thigh(s) and the lower extremity is prepped and draped in the usual sterile fashion. Time out is performed by the operating team and then the  Right lower extremity is wrapped in Esmarch, knee flexed and the tourniquet inflated to 300 mmHg.       A midline incision is made with a ten blade through the subcutaneous tissue to the level of the extensor mechanism. A fresh blade is used to make a medial parapatellar arthrotomy. Soft tissue over the proximal medial tibia is subperiosteally elevated to the joint line with a knife and into the semimembranosus bursa with a Cobb elevator. Soft tissue over the proximal lateral tibia is elevated with attention being paid to avoiding the patellar tendon on the tibial  tubercle. The patella is everted, knee flexed 90 degrees and the ACL and PCL are removed. Findings are bone on bone medial and patellofemoral with large medial osteophytes.        The drill is used to create a starting hole in the distal femur and the canal is thoroughly irrigated with sterile saline to remove the fatty contents. The 5 degree Right  valgus alignment guide is placed into the femoral canal and the distal femoral cutting block is pinned to remove 10 mm off the distal femur. Resection is made with an oscillating saw.      The tibia is subluxed forward and the menisci are removed. The extramedullary alignment guide is placed referencing proximally at the medial aspect of the tibial tubercle and distally along the second metatarsal axis and tibial crest. The block is pinned to remove 2mm off the more deficient medial  side. Resection is made with an oscillating saw. Size 2.5is the most appropriate size for the tibia and the proximal tibia is prepared with the modular drill and keel punch for that size.      The femoral sizing guide is placed and size 2.5 is most appropriate. Rotation is marked off the epicondylar axis and confirmed by creating a rectangular flexion gap at 90 degrees. The size 2.5 cutting block is pinned in this rotation and the anterior, posterior and chamfer cuts are made with the oscillating saw. The intercondylar block is then placed and that cut is made.      Trial size 2.5 tibial component, trial size 2.5 posterior stabilized femur and a 10  mm posterior stabilized  rotating platform insert trial is placed. Full extension is achieved with excellent varus/valgus and anterior/posterior balance throughout full range of motion. The patella is everted and thickness measured to be 22  mm. Free hand resection is taken to 12 mm, a 35 template is placed, lug holes are drilled, trial patella is placed, and it tracks normally. Osteophytes are removed off the posterior femur with the trial in  place. All trials are removed and the cut bone surfaces prepared with pulsatile lavage. Cement is mixed and once ready for implantation, the size 2.5 tibial implant, size  2.5 posterior stabilized femoral component, and the size 35 patella are cemented in place and the patella is held with the clamp. The trial insert is placed and the knee held in full extension. The Exparel (20 ml mixed with 30 ml saline) and .25% Bupivicaine, are injected into the extensor mechanism, posterior capsule, medial and lateral gutters and subcutaneous tissues.  All extruded cement is removed and once the cement is hard the permanent 10 mm posterior stabilized rotating platform insert is placed into the tibial tray.      The wound is copiously irrigated with saline solution and the extensor mechanism closed over a hemovac drain with #1 V-loc suture. The tourniquet is released for a total tourniquet time of 35  minutes. Flexion against gravity is 135 degrees and the patella tracks normally. Subcutaneous tissue is closed with 2.0 vicryl and subcuticular with running 4.0 Monocryl. The incision is cleaned and dried and steri-strips and a bulky sterile dressing are applied. The limb is placed into a knee immobilizer and the patient is awakened and transported to recovery in stable condition.      Please note that a surgical assistant was a medical necessity for this procedure in order to perform it in a safe and expeditious manner. Surgical assistant was necessary to retract the ligaments and vital neurovascular structures to prevent injury to them and also necessary for proper positioning of the limb to allow for anatomic placement of the prosthesis.   Gus RankinFrank V. Christan Defranco, MD    10/29/2013, 10:24 AM

## 2013-10-29 NOTE — Interval H&P Note (Signed)
History and Physical Interval Note:  10/29/2013 7:06 AM  Vanessa HanlonLinda R Caine  has presented today for surgery, with the diagnosis of osteoarthritis of the right knee  The various methods of treatment have been discussed with the patient and family. After consideration of risks, benefits and other options for treatment, the patient has consented to  Procedure(s): RIGHT TOTAL KNEE ARTHROPLASTY (Right) as a surgical intervention .  The patient's history has been reviewed, patient examined, no change in status, stable for surgery.  I have reviewed the patient's chart and labs.  Questions were answered to the patient's satisfaction.     Loanne DrillingALUISIO,Dominiqua Cooner V

## 2013-10-29 NOTE — H&P (View-Only) (Signed)
Vanessa Frank. Cu DOB: 1945-10-06 Married / Language: English / Race: White Female  Date of Admission:  10-29-2013  Chief Complaint:  Right Knee Pain  History of Present Illness  The patient is a 68 year old female who comes in today for a preoperative history and physical. The patient is scheduled for a right total knee arthroplasty to be performed by Dr. Gus Rankin. Aluisio, MD at [facility] on 10-29-2013. The patient is a 68 year old female presenting for a post-operative visit. The patient comes in today 5 weeks out from left total knee arthroplasty. The patient states that he/she is getting better at this time. The pain is under fair control at this time and describe their pain as mild to moderate (soreness). They are currently on Oxycodone for their pain. The patient is currently doing home physical therapy. The patient feels that they are progressing well at this time. She is very pleased with how her left knee has done. She was still doing home therapy and feels as though the knee has made excellent progress. She is having a lot of trouble with her right knee and she said the right knee hurts a lot more than the left. She has documented advanced arthritis of that knee and has had injections in the past. Injections have not provided any long term benefit. She is recovering from the left total knee replacement and now ready to proceed with the right total knee replkacement. They have been treated conservatively in the past for the above stated problem and despite conservative measures, they continue to have progressive pain and severe functional limitations and dysfunction. They have failed non-operative management including home exercise, medications, and injections. It is felt that they would benefit from undergoing total joint replacement. Risks and benefits of the procedure have been discussed with the patient and they elect to proceed with surgery. There are no active contraindications to  surgery such as ongoing infection or rapidly progressive neurological disease.   Allergies  No Known Drug Allergies02/03/2012 sensitivity to codeine - Patient IS able to take oxycodone and hydrocodone   Problem List/Past Medical ([name], [name]; 10/17/2013 2:38 PM) Primary osteoarthritis of left knee (715.16  M17.12) S/P Left total knee arthroplasty (V43.65) Adhesive capsulitis (726.0) Right shoulder pain (719.41  M25.511) Impingement syndrome of shoulder (726.2) Urinary Incontinence Hemorrhoids Gastroesophageal Reflux Disease Rheumatoid Arthritis Menopause Diabetes Mellitus, Type II Fibromyalgia Osteoporosis Degenerative Disc Disease Chronic Pain Varicose veins Depression Shingles High blood pressure Osteoarthritis Anxiety Disorder Impaired Memory Impaired Vision Impaired Hearing Bronchitis Past History Arrhythmia Past History of "Irregular Heart Beat"   Family History ([name], [name]; 10/17/2013 2:17 PM) Congestive Heart Failure First Degree Relatives. mother Cancer First Degree Relatives. mother and father Depression mother and grandmother mothers side mother, sister and grandmother mothers side Siblings Deceased. Sister, age 55, Blood Clots Mother Deceased. age 39 Drug / Alcohol Addiction mother Heart Disease mother Severe allergy mother Hypertension mother Father Deceased, Cancer. 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 never smoker Previously in rehab no Illicit drug use no Drug/Alcohol Rehab  (Currently) no Exercise Exercises rarely; does other Marital status married Current  work status retired Alcohol use current drinker; drinks wine; only occasionally per week Living situation live with spouse Drug/Alcohol Rehab (Previously) no Post-Surgical Plans Skilled Rehab - [facility]  Medication History (Beckey Rutterlezandrew L Perkins, [name]; 10/17/2013 2:17 PM) Robaxin (prn) Active. Aspirin 81mg  Active. Butalbital-ASA-Caff-Codeine (50-325-40-30MG  Capsule, Oral) Active. NexIUM (20MG  Capsule DR, Oral) Active. Vitamin B12 (100MCG Tablet, Oral) Active. NexIUM (Oral) Specific dose unknown - Active. Calcium Carbonate (600MG  Tablet, Oral) Active. Actos (30MG  Tablet, Oral) Active. Oxycodone-Acetaminophen (5-325MG  Tablet, Oral) Active. Lipitor (10MG  Tablet, Oral) Active. Furosemide (40MG  Tablet, Oral) Active. Lisinopril (10MG  Tablet, Oral) Active. MetFORMIN HCl (1000MG  Tablet, Oral) Active. Sertraline HCl (100MG  Tablet, Oral) Active. Metoclopramide HCl (10MG  Tablet, Oral) Active.  Pregnancy / Birth History [name](Alezandrew L Perkins, [name]; 10/17/2013 2:17 PM) Pregnant no  Past Surgical History [name](Alezandrew L Perkins, [name]; 10/17/2013 2:29 PM) Arthroscopy of Knee Date: 2007. right bilateral Spinal Fusion Date: 2012. lower back Bartholian Gland Cyst Removal Date: 671983. Gallbladder Surgery Date: 1989. open Hysterectomy Date: 541980. Partial Foot Surgery right Spinal Surgery Total Knee Replacement - Left Date: 05/2013.   Review of Systems (Alezandrew L. Perkins [name]; 10/17/2013 2:31 PM) General Present- Fatigue, Memory Loss and Night Sweats. Not Present- Chills, Fever, Weight Gain and Weight Loss. Skin Not Present- Eczema, Hives, Itching, Lesions and Rash. HEENT Not Present- Dentures, Double Vision, Headache, Hearing Loss, Tinnitus and Visual Loss. Respiratory Present- Allergies and Shortness of breath with exertion. Not Present- Chronic Cough, Coughing up  blood and Shortness of breath at rest. Cardiovascular Present- Palpitations. Not Present- Chest Pain, Difficulty Breathing Lying Down, Murmur, Racing/skipping heartbeats and Swelling. Gastrointestinal Present- Diarrhea, Heartburn and Vomiting. Not Present- Abdominal Pain, Bloody Stool, Constipation, Difficulty Swallowing, Jaundice, Loss of appetitie and Nausea. Female Genitourinary Present- Incontinence, Urinating at Night and Weak urinary stream. Not Present- Blood in Urine, Discharge, Flank Pain, Painful Urination, Urgency, Urinary frequency and Urinary Retention. Musculoskeletal Present- Back Pain, Joint Pain and Morning Stiffness. Not Present- Joint Swelling, Muscle Pain, Muscle Weakness and Spasms. Neurological Not Present- Blackout spells, Difficulty with balance, Dizziness, Paralysis, Tremor and Weakness. Psychiatric Not Present- Insomnia.   Vitals  Weight: 224 lb Height: 62in Weight was reported by patient. Height was reported by patient. Body Surface Area: 2.11 m Body Mass Index: 40.97 kg/m Pulse: 72 (Regular)  Resp.: 16 (Unlabored)  BP: 110/64 (Sitting, Right Arm, Standard)    Physical Exam  General Mental Status -Alert, cooperative and good historian. General Appearance-pleasant, Not in acute distress. Orientation-Oriented X3. Build & Nutrition-Well nourished and Well developed.  Head and Neck Head-normocephalic, atraumatic . Neck Global Assessment - supple, no bruit auscultated on the right, no bruit auscultated on the left.  Eye Vision-Wears corrective lenses. Pupil - Bilateral-Regular and Round. Motion - Bilateral-EOMI.  Chest and Lung Exam Auscultation Breath sounds - clear at anterior chest wall and clear at posterior chest wall. Adventitious sounds - No Adventitious sounds.  Cardiovascular Auscultation Rhythm - Regular rate and rhythm. Heart Sounds - S1 WNL and S2 WNL. Murmurs & Other Heart Sounds - Auscultation of the heart  reveals - No Murmurs.  Abdomen Inspection Contour - Generalized mild distention. Palpation/Percussion Tenderness - Abdomen is non-tender to palpation. Rigidity (guarding) - Abdomen is soft. Auscultation Auscultation of the abdomen reveals - Bowel sounds normal.  Female Genitourinary Note: Not done, not pertinent to present illness   Musculoskeletal Note: Extremities: Her left knee looks outstanding. There is no swelling. Her range is 0 to 110 actively. There is no tenderness or instability. Her right  knee shows no effusion. There is a varus deformity. There is marked crepitus on range of motion. Range about 5 to 125. She is tender medial greater than lateral with no instability noted.  X-RAYS: Left knee AP and lateral show her prosthesis in excellent position with no periprosthetic abnormalities. Right knee shows just about bone on bone medial and patellofemoral.    Assessment & Plan (Alezandrew L. Perkins [name]; 10/17/2013 2:27 PM) Primary osteoarthritis of right knee (715.16  M17.11) Impression: Right Knee Note:Plan is for a Right Total Knee Replacement by Dr. Lequita Halt.  Plan is to go to [facility] again following this knee replacement.  PCP - Dr. Theressa Millard  The patient does not have any contraindications and will receive TXA (tranexamic acid) prior to surgery.   Signed electronically by [name], [name]

## 2013-10-30 LAB — BASIC METABOLIC PANEL
Anion gap: 15 (ref 5–15)
BUN: 16 mg/dL (ref 6–23)
CO2: 25 mEq/L (ref 19–32)
Calcium: 9 mg/dL (ref 8.4–10.5)
Chloride: 98 mEq/L (ref 96–112)
Creatinine, Ser: 1.18 mg/dL — ABNORMAL HIGH (ref 0.50–1.10)
GFR calc Af Amer: 53 mL/min — ABNORMAL LOW (ref >90)
GFR calc non Af Amer: 46 mL/min — ABNORMAL LOW (ref >90)
Glucose, Bld: 130 mg/dL — ABNORMAL HIGH (ref 70–99)
Potassium: 3.8 mEq/L (ref 3.7–5.3)
Sodium: 138 mEq/L (ref 137–147)

## 2013-10-30 LAB — CBC
HCT: 26 % — ABNORMAL LOW (ref 36.0–46.0)
Hemoglobin: 8.9 g/dL — ABNORMAL LOW (ref 12.0–15.0)
MCH: 29.2 pg (ref 26.0–34.0)
MCHC: 34.2 g/dL (ref 30.0–36.0)
MCV: 85.2 fL (ref 78.0–100.0)
Platelets: 313 10*3/uL (ref 150–400)
RBC: 3.05 MIL/uL — ABNORMAL LOW (ref 3.87–5.11)
RDW: 14.8 % (ref 11.5–15.5)
WBC: 8.7 10*3/uL (ref 4.0–10.5)

## 2013-10-30 LAB — GLUCOSE, CAPILLARY
Glucose-Capillary: 126 mg/dL — ABNORMAL HIGH (ref 70–99)
Glucose-Capillary: 133 mg/dL — ABNORMAL HIGH (ref 70–99)
Glucose-Capillary: 224 mg/dL — ABNORMAL HIGH (ref 70–99)

## 2013-10-30 MED ORDER — ESOMEPRAZOLE MAGNESIUM 20 MG PO CPDR
20.0000 mg | DELAYED_RELEASE_CAPSULE | Freq: Every day | ORAL | Status: DC
  Administered 2013-10-30 – 2013-11-01 (×3): 20 mg via ORAL
  Filled 2013-10-30 (×3): qty 1

## 2013-10-30 MED ORDER — POLYSACCHARIDE IRON COMPLEX 150 MG PO CAPS
150.0000 mg | ORAL_CAPSULE | Freq: Two times a day (BID) | ORAL | Status: DC
Start: 2013-10-30 — End: 2013-11-01
  Administered 2013-10-30 – 2013-11-01 (×5): 150 mg via ORAL
  Filled 2013-10-30 (×6): qty 1

## 2013-10-30 NOTE — Progress Notes (Signed)
PT Cancellation Note  Patient Details Name: Vanessa HanlonLinda R Frank MRN: [id number] DOB: [birth year]   Cancelled Treatment:    Reason Eval/Treat Not Completed: Patient declined, no reason specified Checked on pt for afternoon session however she prefers not to mobilize again due to soreness.   Amado Andal,KATHrine E 10/30/2013, 3:15 PM Zenovia JarredKati Jacqueline Spofford, PT, DPT 10/30/2013 Pager: [phone]

## 2013-10-30 NOTE — Discharge Instructions (Addendum)
° °Dr. Frank Aluisio °Total Joint Specialist °[facility] °[address] °[place], [place] [address] °[phone] ° °TOTAL KNEE REPLACEMENT POSTOPERATIVE DIRECTIONS ° ° ° °Knee Rehabilitation, Guidelines Following Surgery  °Results after knee surgery are often greatly improved when you follow the exercise, range of motion and muscle strengthening exercises prescribed by your doctor. Safety measures are also important to protect the knee from further injury. Any time any of these exercises cause you to have increased pain or swelling in your knee joint, decrease the amount until you are comfortable again and slowly increase them. If you have problems or questions, call your caregiver or physical therapist for advice.  ° °HOME CARE INSTRUCTIONS  °Remove items at home which could result in a fall. This includes throw rugs or furniture in walking pathways.  °Continue medications as instructed at time of discharge. °You may have some home medications which will be placed on hold until you complete the course of blood thinner medication.  °You may start showering once you are discharged home but do not submerge the incision under water. Just pat the incision dry and apply a dry gauze dressing on daily. °Walk with walker as instructed.  °You may resume a sexual relationship in one month or when given the OK by  your doctor.  °· Use walker as long as suggested by your caregivers. °· Avoid periods of inactivity such as sitting longer than an hour when not asleep. This helps prevent blood clots.  °You may put full weight on your legs and walk as much as is comfortable.  °You may return to work once you are cleared by your doctor.  °Do not drive a car for 6 weeks or until released by you surgeon.  °· Do not drive while taking narcotics.  °Wear the elastic stockings for three weeks following surgery during the day but you may remove then at night. °Make sure you keep all of your appointments after your  operation with all of your doctors and caregivers. You should call the office at the above phone number and make an appointment for approximately two weeks after the date of your surgery. °Change the dressing daily and reapply a dry dressing each time. °Please pick up a stool softener and laxative for home use as long as you are requiring pain medications. °· Continue to use ice on the knee for pain and swelling from surgery. You may notice swelling that will progress down to the foot and ankle.  This is normal after surgery.  Elevate the leg when you are not up walking on it.   °It is important for you to complete the blood thinner medication as prescribed by your doctor. °· Continue to use the breathing machine which will help keep your temperature down.  It is common for your temperature to cycle up and down following surgery, especially at night when you are not up moving around and exerting yourself.  The breathing machine keeps your lungs expanded and your temperature down. ° °RANGE OF MOTION AND STRENGTHENING EXERCISES  °Rehabilitation of the knee is important following a knee injury or an operation. After just a few days of immobilization, the muscles of the thigh which control the knee become weakened and shrink (atrophy). Knee exercises are designed to build up the tone and strength of the thigh muscles and to improve knee motion. Often times heat used for twenty to thirty minutes before working out will loosen up your tissues and help with improving the   range of motion but do not use heat for the first two weeks following surgery. These exercises can be done on a training (exercise) mat, on the floor, on a table or on a bed. Use what ever works the best and is most comfortable for you Knee exercises include:  Leg Lifts - While your knee is still immobilized in a splint or cast, you can do straight leg raises. Lift the leg to 60 degrees, hold for 3 sec, and slowly lower the leg. Repeat 10-20 times 2-3  times daily. Perform this exercise against resistance later as your knee gets better.  Quad and Hamstring Sets - Tighten up the muscle on the front of the thigh (Quad) and hold for 5-10 sec. Repeat this 10-20 times hourly. Hamstring sets are done by pushing the foot backward against an object and holding for 5-10 sec. Repeat as with quad sets.  A rehabilitation program following serious knee injuries can speed recovery and prevent re-injury in the future due to weakened muscles. Contact your doctor or a physical therapist for more information on knee rehabilitation.   SKILLED REHAB INSTRUCTIONS: If the patient is transferred to a skilled rehab facility following release from the hospital, a list of the current medications will be sent to the facility for the patient to continue.  When discharged from the skilled rehab facility, please have the facility set up the patient's Home Health Physical Therapy prior to being released. Also, the skilled facility will be responsible for providing the patient with their medications at time of release from the facility to include their pain medication, the muscle relaxants, and their blood thinner medication. If the patient is still at the rehab facility at time of the two week follow up appointment, the skilled rehab facility will also need to assist the patient in arranging follow up appointment in our office and any transportation needs.  MAKE SURE YOU:  Understand these instructions.  Will watch your condition.  Will get help right away if you are not doing well or get worse.    Pick up stool softner and laxative for home. Do not submerge incision under water. May shower. Continue to use ice for pain and swelling from surgery.  Take Xarelto for two and a half more weeks, then discontinue Xarelto. Once the patient has completed the blood thinner regimen, then take a Baby 81 mg Aspirin daily for three more weeks.  Follow up next Thursday July 23rd in the  office.   Information on my medicine - XARELTO (Rivaroxaban)  This medication education was reviewed with me or my healthcare representative as part of my discharge preparation.  The pharmacist that spoke with me during my hospital stay was:  WOFFORD, DREW A, RPH  Why was Xarelto prescribed for you? Xarelto was prescribed for you to reduce the risk of blood clots forming after orthopedic surgery. The medical term for these abnormal blood clots is venous thromboembolism (VTE).  What do you need to know about xarelto ? Take your Xarelto ONCE DAILY at the same time every day. You may take it either with or without food.  If you have difficulty swallowing the tablet whole, you may crush it and mix in applesauce just prior to taking your dose.  Take Xarelto exactly as prescribed by your doctor and DO NOT stop taking Xarelto without talking to the doctor who prescribed the medication.  Stopping without other VTE prevention medication to take the place of Xarelto may increase your risk of developing  a clot.  After discharge, you should have regular check-up appointments with your healthcare provider that is prescribing your Xarelto.    What do you do if you miss a dose? If you miss a dose, take it as soon as you remember on the same day then continue your regularly scheduled once daily regimen the next day. Do not take two doses of Xarelto on the same day.   Important Safety Information A possible side effect of Xarelto is bleeding. You should call your healthcare provider right away if you experience any of the following:   Bleeding from an injury or your nose that does not stop.   Unusual colored urine (red or dark brown) or unusual colored stools (red or black).   Unusual bruising for unknown reasons.   A serious fall or if you hit your head (even if there is no bleeding).  Some medicines may interact with Xarelto and might increase your risk of bleeding while on Xarelto. To  help avoid this, consult your healthcare provider or pharmacist prior to using any new prescription or non-prescription medications, including herbals, vitamins, non-steroidal anti-inflammatory drugs (NSAIDs) and supplements.  This website has more information on Xarelto: VisitDestination.com.br.

## 2013-10-30 NOTE — Progress Notes (Signed)
Physical Therapy Treatment Patient Details Name: Vanessa HanlonLinda R Frank MRN: [id number] DOB: 1945/09/02 Today's Date: 10/30/2013    History of Present Illness s/p R TKA    PT Comments    Pt reporting increased soreness today however agreeable to activity as tolerated.  Follow Up Recommendations  SNF     Equipment Recommendations  None recommended by PT    Recommendations for Other Services       Precautions / Restrictions Precautions Precautions: Knee Required Braces or Orthoses: Knee Immobilizer - Right Knee Immobilizer - Right: Discontinue once straight leg raise with < 10 degree lag Restrictions Other Position/Activity Restrictions: WBAT    Mobility  Bed Mobility Overal bed mobility: Needs Assistance Bed Mobility: Supine to Sit     Supine to sit: Min assist     General bed mobility comments: assist with RLE, verbal cues for technique  Transfers Overall transfer level: Needs assistance Equipment used: Rolling walker (2 wheeled) Transfers: Sit to/from UGI CorporationStand;Stand Pivot Transfers Sit to Stand: Min assist Stand pivot transfers: Min assist       General transfer comment: verbal cues for safe technique, assist to rise  Ambulation/Gait Ambulation/Gait assistance: Min guard Ambulation Distance (Feet): 25 Feet Assistive device: Rolling walker (2 wheeled) Gait Pattern/deviations: Step-to pattern;Antalgic     General Gait Details: verbal cues for sequence, RW distance, pt reports soreness limiting distance   Stairs            Wheelchair Mobility    Modified Rankin (Stroke Patients Only)       Balance                                    Cognition Arousal/Alertness: Awake/alert Behavior During Therapy: WFL for tasks assessed/performed Overall Cognitive Status: Within Functional Limits for tasks assessed                      Exercises Total Joint Exercises Ankle Circles/Pumps: AROM;10 reps;Supine;Both Quad Sets: AROM;Supine;10  reps;Right Towel Squeeze: AROM;Both;10 reps Short Arc QuadBarbaraann Boys: AAROM;Right;10 reps Heel Slides: AAROM;Right;10 reps;Supine Hip ABduction/ADduction: AAROM;Right;10 reps    General Comments        Pertinent Vitals/Pain Due for pain meds end of session which RN brought into room, ice packs applied, activity to tolerance    Home Living                      Prior Function            PT Goals (current goals can now be found in the care plan section) Progress towards PT goals: Progressing toward goals    Frequency  7X/week    PT Plan Current plan remains appropriate    Co-evaluation             End of Session Equipment Utilized During Treatment: Right knee immobilizer Activity Tolerance: Patient tolerated treatment well Patient left: in chair;with call bell/phone within reach;with nursing/sitter in room     Time: [id number] PT Time Calculation (min): 24 min  Charges:  $Gait Training: 8-22 mins $Therapeutic Exercise: 8-22 mins                    G Codes:      Morgon Pamer,KATHrine E 10/30/2013, 1:46 PM Zenovia JarredKati Lashawn Orrego, PT, DPT 10/30/2013 Pager: [phone]

## 2013-10-30 NOTE — Progress Notes (Signed)
OT Cancellation Note  Patient Details Name: Vanessa HanlonLinda R Mortimer MRN: [id number] DOB: [birth year]   Cancelled Treatment:    Reason Eval/Treat Not Completed: Other (comment) Note plan for SNF. Will defer OT to SNF.  Lennox LaityStone, Waleed Dettman Stafford [phone] 10/30/2013, 8:25 AM

## 2013-10-30 NOTE — Progress Notes (Signed)
   Subjective: 1 Day Post-Op Procedure(s) (LRB): RIGHT TOTAL KNEE ARTHROPLASTY (Right) Patient reports pain as mild.   Patient seen in rounds with Dr. Lequita HaltAluisio.  She had a good night last night. Patient is well, and has had no acute complaints or problems We will start therapy today.  Plan is to go Skilled nursing facility after hospital stay.  Objective: Vital signs in last 24 hours: Temp:  [97.3 F (36.3 C)-98.4 F (36.9 C)] 98.4 F (36.9 C) (07/14 0630) Pulse Rate:  [61-101] 63 (07/14 0630) Resp:  [9-20] 20 (07/14 0630) BP: (108-172)/(53-79) 119/73 mmHg (07/14 0630) SpO2:  [94 %-100 %] 99 % (07/14 0630) FiO2 (%):  [94 %] 94 % (07/13 1246) Weight:  [99.791 kg (220 lb)] 99.791 kg (220 lb) (07/13 1246)  Intake/Output from previous day:  Intake/Output Summary (Last 24 hours) at 10/30/13 0825 Last data filed at 10/30/13 0636  Gross per 24 hour  Intake   4465 ml  Output   4610 ml  Net   -145 ml    Intake/Output this shift: UOP 1900 -145  Labs:  Recent Labs  10/30/13 0438  HGB 8.9*    Recent Labs  10/30/13 0438  WBC 8.7  RBC 3.05*  HCT 26.0*  PLT 313    Recent Labs  10/30/13 0438  NA 138  K 3.8  CL 98  CO2 25  BUN 16  CREATININE 1.18*  GLUCOSE 130*  CALCIUM 9.0   No results found for this basename: LABPT, INR,  in the last 72 hours  EXAM General - Patient is Alert, Appropriate and Oriented Extremity - Neurovascular intact Sensation intact distally Dorsiflexion/Plantar flexion intact Dressing - dressing C/D/I Motor Function - intact, moving foot and toes well on exam.  Hemovac pulled without difficulty.  Past Medical History  Diagnosis Date  . Hypertension   . Dysrhythmia IRREGULAR  RATE IN HER 20'S  . Diabetes mellitus ORAL MEDS ONLY  . GERD (gastroesophageal reflux disease)   . Headache(784.0)   . Arthritis   . Anxiety   . Shortness of breath WITH EXERTION  . Fibromyalgia   . Neuropathy     TOES  . Stress incontinence   .  Palpitations     OCCASIONAL    Assessment/Plan: 1 Day Post-Op Procedure(s) (LRB): RIGHT TOTAL KNEE ARTHROPLASTY (Right) Active Problems:   OA (osteoarthritis) of knee  Estimated body mass index is 40.23 kg/(m^2) as calculated from the following:   Height as of this encounter: 5\' 2"  (1.575 m).   Weight as of this encounter: 99.791 kg (220 lb). Advance diet Up with therapy Discharge to SNF Will have her follow up in 10 days after surgery Add Iron supplement  DVT Prophylaxis - Xarelto Weight-Bearing as tolerated to right leg D/C O2 and Pulse OX and try on Room Air  Avel Peacerew Jeani Fassnacht, PA-C Orthopaedic Surgery 10/30/2013, 8:25 AM

## 2013-10-31 LAB — CBC
HCT: 25.6 % — ABNORMAL LOW (ref 36.0–46.0)
Hemoglobin: 8.6 g/dL — ABNORMAL LOW (ref 12.0–15.0)
MCH: 28.5 pg (ref 26.0–34.0)
MCHC: 33.6 g/dL (ref 30.0–36.0)
MCV: 84.8 fL (ref 78.0–100.0)
Platelets: 311 10*3/uL (ref 150–400)
RBC: 3.02 MIL/uL — ABNORMAL LOW (ref 3.87–5.11)
RDW: 15 % (ref 11.5–15.5)
WBC: 8 10*3/uL (ref 4.0–10.5)

## 2013-10-31 LAB — BASIC METABOLIC PANEL
Anion gap: 13 (ref 5–15)
BUN: 24 mg/dL — ABNORMAL HIGH (ref 6–23)
CO2: 30 mEq/L (ref 19–32)
Calcium: 9.3 mg/dL (ref 8.4–10.5)
Chloride: 97 mEq/L (ref 96–112)
Creatinine, Ser: 1.25 mg/dL — ABNORMAL HIGH (ref 0.50–1.10)
GFR calc Af Amer: 50 mL/min — ABNORMAL LOW (ref >90)
GFR calc non Af Amer: 43 mL/min — ABNORMAL LOW (ref >90)
Glucose, Bld: 148 mg/dL — ABNORMAL HIGH (ref 70–99)
Potassium: 4.5 mEq/L (ref 3.7–5.3)
Sodium: 140 mEq/L (ref 137–147)

## 2013-10-31 LAB — GLUCOSE, CAPILLARY
Glucose-Capillary: 140 mg/dL — ABNORMAL HIGH (ref 70–99)
Glucose-Capillary: 147 mg/dL — ABNORMAL HIGH (ref 70–99)
Glucose-Capillary: 141 mg/dL — ABNORMAL HIGH (ref 70–99)
Glucose-Capillary: 160 mg/dL — ABNORMAL HIGH (ref 70–99)

## 2013-10-31 NOTE — Progress Notes (Signed)
Physical Therapy Treatment Patient Details Name: Vanessa HanlonLinda R Frank MRN: [id number] DOB: 1946-01-19 Today's Date: 10/31/2013    History of Present Illness s/p R TKA    PT Comments    Pt reports soreness today again however much improved since yesterday.  Pt ambulated in hallway and performed exercises.  Follow Up Recommendations  SNF     Equipment Recommendations  None recommended by PT    Recommendations for Other Services       Precautions / Restrictions Precautions Precautions: Knee Required Braces or Orthoses: Knee Immobilizer - Right Knee Immobilizer - Right: Discontinue once straight leg raise with < 10 degree lag Restrictions Other Position/Activity Restrictions: WBAT    Mobility  Bed Mobility               General bed mobility comments: pt up in recliner on arrival  Transfers Overall transfer level: Needs assistance Equipment used: Rolling walker (2 wheeled) Transfers: Sit to/from Stand Sit to Stand: Min guard         General transfer comment: verbal cues for safe technique  Ambulation/Gait Ambulation/Gait assistance: Min guard Ambulation Distance (Feet): 80 Feet Assistive device: Rolling walker (2 wheeled) Gait Pattern/deviations: Step-to pattern;Antalgic     General Gait Details: verbal cues for sequence, RW distance; pt reports feeling better since yesterday   Stairs            Wheelchair Mobility    Modified Rankin (Stroke Patients Only)       Balance                                    Cognition Arousal/Alertness: Awake/alert Behavior During Therapy: WFL for tasks assessed/performed Overall Cognitive Status: Within Functional Limits for tasks assessed                      Exercises Total Joint Exercises Ankle Circles/Pumps: AROM;10 reps;Supine;Both Quad Sets: AROM;Supine;10 reps;Right Towel Squeeze: AROM;Both;10 reps Short Arc QuadBarbaraann Boys: AAROM;Right;15 reps Heel Slides: AAROM;Right;Supine;15 reps Hip  ABduction/ADduction: AAROM;Right;15 reps Straight Leg Raises: AAROM;Right;Supine;10 reps    General Comments        Pertinent Vitals/Pain Premedicated, activity to tolerance, ice packs applied end of session    Home Living                      Prior Function            PT Goals (current goals can now be found in the care plan section) Progress towards PT goals: Progressing toward goals    Frequency  7X/week    PT Plan Current plan remains appropriate    Co-evaluation             End of Session Equipment Utilized During Treatment: Right knee immobilizer Activity Tolerance: Patient tolerated treatment well Patient left: in chair;with call bell/phone within reach     Time: 1020-1045 PT Time Calculation (min): 25 min  Charges:  $Gait Training: 8-22 mins $Therapeutic Exercise: 8-22 mins                    G Codes:      Adrain Butrick,KATHrine E 10/31/2013, 1:48 PM Zenovia JarredKati Lorilyn Laitinen, PT, DPT 10/31/2013 Pager: [phone]

## 2013-10-31 NOTE — Progress Notes (Signed)
Physical Therapy Treatment Note   10/31/13 1500  PT Visit Information  Last PT Received On 10/31/13  Assistance Needed +1  History of Present Illness s/p R TKA  PT Time Calculation  PT Start Time 1433  PT Stop Time 1451  PT Time Calculation (min) 18 min  Subjective Data  Subjective Pt ambulated in hallway again this afternoon and able to increase distance.  Pt reports she is happy she has pain better controlled today.  Precautions  Precautions Knee  Required Braces or Orthoses Knee Immobilizer - Right  Knee Immobilizer - Right Discontinue once straight leg raise with < 10 degree lag  Restrictions  Other Position/Activity Restrictions WBAT  Cognition  Arousal/Alertness Awake/alert  Behavior During Therapy WFL for tasks assessed/performed  Overall Cognitive Status Within Functional Limits for tasks assessed  Bed Mobility  Overal bed mobility Needs Assistance  Bed Mobility Sit to Supine  Sit to supine Min assist  General bed mobility comments assist for R LE  Transfers  Overall transfer level Needs assistance  Equipment used Rolling walker (2 wheeled)  Transfers Sit to/from Stand  Sit to Stand Min guard  General transfer comment verbal cues for safe technique  Ambulation/Gait  Ambulation/Gait assistance Min guard  Ambulation Distance (Feet) 140 Feet  Assistive device Rolling walker (2 wheeled)  Gait Pattern/deviations Step-to pattern;Antalgic  General Gait Details verbal cues for step length, RW positioning  PT - End of Session  Equipment Utilized During Treatment Right knee immobilizer  Activity Tolerance Patient tolerated treatment well  Patient left in bed;with call bell/phone within reach  PT - Assessment/Plan  PT Plan Current plan remains appropriate  PT Frequency 7X/week  Follow Up Recommendations SNF  PT equipment None recommended by PT  PT Goal Progression  Progress towards PT goals Progressing toward goals  PT General Charges  $$ ACUTE PT VISIT 1 Procedure   PT Treatments  $Gait Training 8-22 mins   Vanessa Frank, PT, DPT 10/31/2013 Pager: [phone]

## 2013-11-01 LAB — CBC
HCT: 26 % — ABNORMAL LOW (ref 36.0–46.0)
Hemoglobin: 8.6 g/dL — ABNORMAL LOW (ref 12.0–15.0)
MCH: 28 pg (ref 26.0–34.0)
MCHC: 33.1 g/dL (ref 30.0–36.0)
MCV: 84.7 fL (ref 78.0–100.0)
Platelets: 345 10*3/uL (ref 150–400)
RBC: 3.07 MIL/uL — ABNORMAL LOW (ref 3.87–5.11)
RDW: 15.3 % (ref 11.5–15.5)
WBC: 6.7 10*3/uL (ref 4.0–10.5)

## 2013-11-01 LAB — GLUCOSE, CAPILLARY: Glucose-Capillary: 121 mg/dL — ABNORMAL HIGH (ref 70–99)

## 2013-11-01 MED ORDER — METHOCARBAMOL 500 MG PO TABS: 500.0000 mg | ORAL_TABLET | Freq: Four times a day (QID) | ORAL | Status: DC | PRN

## 2013-11-01 MED ORDER — ONDANSETRON HCL 4 MG PO TABS: 4.0000 mg | ORAL_TABLET | Freq: Four times a day (QID) | ORAL | Status: DC | PRN

## 2013-11-01 MED ORDER — ESOMEPRAZOLE MAGNESIUM 20 MG PO CPDR: 20.0000 mg | DELAYED_RELEASE_CAPSULE | Freq: Every day | ORAL | Status: DC

## 2013-11-01 MED ORDER — BISACODYL 10 MG RE SUPP: 10.0000 mg | Freq: Every day | RECTAL | Status: DC | PRN

## 2013-11-01 MED ORDER — OXYCODONE HCL 5 MG PO TABS: 5.0000 mg | ORAL_TABLET | ORAL | Status: DC | PRN

## 2013-11-01 MED ORDER — POLYETHYLENE GLYCOL 3350 17 G PO PACK: 17.0000 g | PACK | Freq: Every day | ORAL | Status: DC | PRN

## 2013-11-01 MED ORDER — RIVAROXABAN 10 MG PO TABS: 10.0000 mg | ORAL_TABLET | Freq: Every day | ORAL | Status: DC

## 2013-11-01 MED ORDER — ACETAMINOPHEN 325 MG PO TABS: 650.0000 mg | ORAL_TABLET | Freq: Four times a day (QID) | ORAL | Status: DC | PRN

## 2013-11-01 MED ORDER — POLYSACCHARIDE IRON COMPLEX 150 MG PO CAPS: 150.0000 mg | ORAL_CAPSULE | Freq: Two times a day (BID) | ORAL | Status: DC

## 2013-11-01 MED ORDER — DSS 100 MG PO CAPS: 100.0000 mg | ORAL_CAPSULE | Freq: Two times a day (BID) | ORAL | Status: DC

## 2013-11-01 NOTE — Progress Notes (Signed)
LATE ENTRY NOTE Date of Service of Visit - 10/31/2013    Subjective: 2 Days Post-Op Procedure(s) (LRB): RIGHT TOTAL KNEE ARTHROPLASTY (Right) Patient reports pain as mild and moderate.   Patient seen in rounds with Dr. Lequita HaltAluisio.  Had a tough day on day one with pain.  A little better today. Patient is well, but has had some minor complaints of pain in the knww, requiring pain medications Plan is to go Skilled nursing facility after hospital stay.  Objective: Vital signs in last 24 hours: Temp:  [97.8 F (36.6 C)-98.4 F (36.9 C)] 98.4 F (36.9 C) (07/16 0522) Pulse Rate:  [67-81] 80 (07/16 0522) Resp:  [15-20] 18 (07/16 0522) BP: (120-169)/(56-67) 148/67 mmHg (07/16 0522) SpO2:  [93 %-96 %] 94 % (07/16 0522)  Labs:  Recent Labs  10/30/13 0438 10/31/13 0458   HGB 8.9* 8.6*     Recent Labs  10/31/13 0458   WBC 8.0   RBC 3.02*   HCT 25.6*   PLT 311     Recent Labs  10/30/13 0438 10/31/13 0458  NA 138 140  K 3.8 4.5  CL 98 97  CO2 25 30  BUN 16 24*  CREATININE 1.18* 1.25*  GLUCOSE 130* 148*  CALCIUM 9.0 9.3   No results found for this basename: LABPT, INR,  in the last 72 hours  EXAM General - Patient is Alert and Appropriate Extremity - Neurovascular intact Sensation intact distally Dorsiflexion/Plantar flexion intact Dressing/Incision - clean, dry, no drainage Motor Function - intact, moving foot and toes well on exam.   Past Medical History  Diagnosis Date  . Hypertension   . Dysrhythmia IRREGULAR  RATE IN HER 20'S  . Diabetes mellitus ORAL MEDS ONLY  . GERD (gastroesophageal reflux disease)   . Headache(784.0)   . Arthritis   . Anxiety   . Shortness of breath WITH EXERTION  . Fibromyalgia   . Neuropathy     TOES  . Stress incontinence   . Palpitations     OCCASIONAL    Assessment/Plan: 2 Days Post-Op Procedure(s) (LRB): RIGHT TOTAL KNEE ARTHROPLASTY (Right) Active Problems:   OA (osteoarthritis) of knee  Estimated body mass index  is 40.23 kg/(m^2) as calculated from the following:   Height as of this encounter: 5\' 2"  (1.575 m).   Weight as of this encounter: 99.791 kg (220 lb). Advance diet Up with therapy Plan for discharge tomorrow Discharge to SNF  DVT Prophylaxis - Xarelto Weight-Bearing as tolerated to right leg  Avel Peacerew Perkins, PA-C Orthopaedic Surgery 11/01/2013, 8:39 AM

## 2013-11-01 NOTE — Progress Notes (Signed)
Physical Therapy Treatment Patient Details Name: Vanessa HanlonLinda R Frank MRN: [id number] DOB: 06/04/1945 Today's Date: 11/01/2013    History of Present Illness s/p R TKA    PT Comments    Pt ambulated in hallway and performed exercises prior to d/c today to SNF.  Pt to transport by car and family in room so discussed safe car transfer with pt and family. Pt had no further questions.  Follow Up Recommendations  SNF     Equipment Recommendations  None recommended by PT    Recommendations for Other Services       Precautions / Restrictions Precautions Precautions: Knee Required Braces or Orthoses: Knee Immobilizer - Right Knee Immobilizer - Right: Discontinue once straight leg raise with < 10 degree lag Restrictions Other Position/Activity Restrictions: WBAT    Mobility  Bed Mobility               General bed mobility comments: pt up in recliner on arrival  Transfers Overall transfer level: Needs assistance Equipment used: Rolling walker (2 wheeled) Transfers: Sit to/from Stand Sit to Stand: Min guard         General transfer comment: verbal cues for safe technique  Ambulation/Gait Ambulation/Gait assistance: Min guard Ambulation Distance (Feet): 100 Feet Assistive device: Rolling walker (2 wheeled) Gait Pattern/deviations: Step-to pattern;Antalgic     General Gait Details: pt reports increased soreness today, distance to tolerance   Stairs            Wheelchair Mobility    Modified Rankin (Stroke Patients Only)       Balance                                    Cognition Arousal/Alertness: Awake/alert Behavior During Therapy: WFL for tasks assessed/performed Overall Cognitive Status: Within Functional Limits for tasks assessed                      Exercises Total Joint Exercises Ankle Circles/Pumps: AROM;Supine;Both;15 reps Quad Sets: AROM;Supine;Right;15 reps Towel Squeeze: AROM;Both;15 reps Short Arc QuadBarbaraann Boys:  AAROM;Right;15 reps Heel Slides: AAROM;Right;Supine;15 reps Hip ABduction/ADduction: AAROM;Right;15 reps Straight Leg Raises: AAROM;Right;Supine;10 reps Goniometric ROM: R knee AAROM flexion 45* limited by pain    General Comments        Pertinent Vitals/Pain Pt reports being a little more sore today vs yesterday, premedicated, activity to tolerance, ice packs applied end of session.    Home Living                      Prior Function            PT Goals (current goals can now be found in the care plan section) Progress towards PT goals: Progressing toward goals    Frequency  7X/week    PT Plan Current plan remains appropriate    Co-evaluation             End of Session Equipment Utilized During Treatment: Right knee immobilizer Activity Tolerance: Patient tolerated treatment well Patient left: in chair;with family/visitor present;with call bell/phone within reach     Time: [id number] PT Time Calculation (min): 29 min  Charges:  $Gait Training: 8-22 mins $Therapeutic Exercise: 8-22 mins                    G Codes:      Alonso Gapinski,KATHrine E 11/01/2013, 1:23 PM Zenovia JarredKati Thaddius Manes, PT, DPT 11/01/2013 Pager:  [phone]   

## 2013-11-01 NOTE — Progress Notes (Signed)
   Subjective: 3 Days Post-Op Procedure(s) (LRB): RIGHT TOTAL KNEE ARTHROPLASTY (Right) Patient reports pain as mild.   Patient seen in rounds with Dr. Lequita HaltAluisio.  Doing better today.  HGB stable. Patient is well, and has had no acute complaints or problems [facility]  Objective: Vital signs in last 24 hours: Temp:  [97.8 F (36.6 C)-98.4 F (36.9 C)] 98.4 F (36.9 C) (07/16 0522) Pulse Rate:  [67-81] 80 (07/16 0522) Resp:  [15-20] 18 (07/16 0522) BP: (120-169)/(56-67) 148/67 mmHg (07/16 0522) SpO2:  [93 %-96 %] 94 % (07/16 0522)  Intake/Output from previous day:  Intake/Output Summary (Last 24 hours) at 11/01/13 0842 Last data filed at 11/01/13 [id number]  Gross per 24 hour  Intake    960 ml  Output   1725 ml  Net   -765 ml    Intake/Output this shift:    Labs:  Recent Labs  10/30/13 0438 10/31/13 0458 11/01/13 0437  HGB 8.9* 8.6* 8.6*    Recent Labs  10/31/13 0458 11/01/13 0437  WBC 8.0 6.7  RBC 3.02* 3.07*  HCT 25.6* 26.0*  PLT 311 345    Recent Labs  10/30/13 0438 10/31/13 0458  NA 138 140  K 3.8 4.5  CL 98 97  CO2 25 30  BUN 16 24*  CREATININE 1.18* 1.25*  GLUCOSE 130* 148*  CALCIUM 9.0 9.3   No results found for this basename: LABPT, INR,  in the last 72 hours  EXAM: General - Patient is Alert, Appropriate and Oriented Extremity - Neurovascular intact Sensation intact distally Dorsiflexion/Plantar flexion intact Incision - clean, dry, no drainage, healing Motor Function - intact, moving foot and toes well on exam.   Assessment/Plan: 3 Days Post-Op Procedure(s) (LRB): RIGHT TOTAL KNEE ARTHROPLASTY (Right) Procedure(s) (LRB): RIGHT TOTAL KNEE ARTHROPLASTY (Right) Past Medical History  Diagnosis Date  . Hypertension   . Dysrhythmia IRREGULAR  RATE IN HER 20'S  . Diabetes mellitus ORAL MEDS ONLY  . GERD (gastroesophageal reflux disease)   . Headache(784.0)   . Arthritis   . Anxiety   . Shortness of breath  WITH EXERTION  . Fibromyalgia   . Neuropathy     TOES  . Stress incontinence   . Palpitations     OCCASIONAL   Active Problems:   OA (osteoarthritis) of knee  Estimated body mass index is 40.23 kg/(m^2) as calculated from the following:   Height as of this encounter: 5\' 2"  (1.575 m).   Weight as of this encounter: 99.791 kg (220 lb). Up with therapy Discharge to SNF - [facility] Diet - Cardiac diet and Diabetic diet Follow up - next Thursday on July 23rd Activity - WBAT Disposition - Skilled nursing facility Condition Upon Discharge - Good D/C Meds - See DC Summary DVT Prophylaxis - Xarelto  Avel Peacerew Tahir Blank, PA-C Orthopaedic Surgery 11/01/2013, 8:42 AM

## 2013-11-01 NOTE — Discharge Summary (Signed)
Physician Discharge Summary   Patient ID: Vanessa Frank MRN: [id number] DOB/AGE: August 15, 1945 68 y.o.  Admit date: 10/29/2013 Discharge date: 11/01/2013  Primary Diagnosis:  Osteoarthritis Right knee(s)  Admission Diagnoses:  Past Medical History  Diagnosis Date  . Hypertension   . Dysrhythmia IRREGULAR  RATE IN HER 20'S  . Diabetes mellitus ORAL MEDS ONLY  . GERD (gastroesophageal reflux disease)   . Headache(784.0)   . Arthritis   . Anxiety   . Shortness of breath WITH EXERTION  . Fibromyalgia   . Neuropathy     TOES  . Stress incontinence   . Palpitations     OCCASIONAL   Discharge Diagnoses:   Active Problems:   OA (osteoarthritis) of knee  Estimated body mass index is 40.23 kg/(m^2) as calculated from the following:   Height as of this encounter: 5' 2"  (1.575 m).   Weight as of this encounter: 99.791 kg (220 lb).  Procedure:  Procedure(s) (LRB): RIGHT TOTAL KNEE ARTHROPLASTY (Right)   Consults: None  HPI: Vanessa Frank is a 68 y.o. year old female with end stage OA of her right knee with progressively worsening pain and dysfunction. She has constant pain, with activity and at rest and significant functional deficits with difficulties even with ADLs. She has had extensive non-op management including analgesics, injections of cortisone and viscosupplements, and home exercise program, but remains in significant pain with significant dysfunction.Radiographs show bone on bone arthritis medial and patellofemoral. She presents now for right Total Knee Arthroplasty.  Laboratory Data: Admission on 10/29/2013  Component Date Value Ref Range Status  . Glucose-Capillary 10/29/2013 126* 70 - 99 mg/dL Final  . Comment 1 10/29/2013 Notify RN   Final  . Glucose-Capillary 10/29/2013 156* 70 - 99 mg/dL Final  . Glucose-Capillary 10/29/2013 225* 70 - 99 mg/dL Final  . WBC 10/30/2013 8.7  4.0 - 10.5 K/uL Final  . RBC 10/30/2013 3.05* 3.87 - 5.11 MIL/uL Final  . Hemoglobin  10/30/2013 8.9* 12.0 - 15.0 g/dL Final  . HCT 10/30/2013 26.0* 36.0 - 46.0 % Final  . MCV 10/30/2013 85.2  78.0 - 100.0 fL Final  . MCH 10/30/2013 29.2  26.0 - 34.0 pg Final  . MCHC 10/30/2013 34.2  30.0 - 36.0 g/dL Final  . RDW 10/30/2013 14.8  11.5 - 15.5 % Final  . Platelets 10/30/2013 313  150 - 400 K/uL Final  . Sodium 10/30/2013 138  137 - 147 mEq/L Final  . Potassium 10/30/2013 3.8  3.7 - 5.3 mEq/L Final  . Chloride 10/30/2013 98  96 - 112 mEq/L Final  . CO2 10/30/2013 25  19 - 32 mEq/L Final  . Glucose, Bld 10/30/2013 130* 70 - 99 mg/dL Final  . BUN 10/30/2013 16  6 - 23 mg/dL Final  . Creatinine, Ser 10/30/2013 1.18* 0.50 - 1.10 mg/dL Final  . Calcium 10/30/2013 9.0  8.4 - 10.5 mg/dL Final  . GFR calc non Af Amer 10/30/2013 46* >90 mL/min Final  . GFR calc Af Amer 10/30/2013 53* >90 mL/min Final   Comment: (NOTE)                          The eGFR has been calculated using the CKD EPI equation.                          This calculation has not been validated in all clinical situations.  eGFR's persistently <90 mL/min signify possible Chronic Kidney                          Disease.  . Anion gap 10/30/2013 15  5 - 15 Final  . Glucose-Capillary 10/29/2013 188* 70 - 99 mg/dL Final  . Comment 1 10/29/2013 Documented in Chart   Final  . Comment 2 10/29/2013 Notify RN   Final  . Glucose-Capillary 10/30/2013 126* 70 - 99 mg/dL Final  . Glucose-Capillary 10/30/2013 133* 70 - 99 mg/dL Final  . WBC 10/31/2013 8.0  4.0 - 10.5 K/uL Final  . RBC 10/31/2013 3.02* 3.87 - 5.11 MIL/uL Final  . Hemoglobin 10/31/2013 8.6* 12.0 - 15.0 g/dL Final  . HCT 10/31/2013 25.6* 36.0 - 46.0 % Final  . MCV 10/31/2013 84.8  78.0 - 100.0 fL Final  . MCH 10/31/2013 28.5  26.0 - 34.0 pg Final  . MCHC 10/31/2013 33.6  30.0 - 36.0 g/dL Final  . RDW 10/31/2013 15.0  11.5 - 15.5 % Final  . Platelets 10/31/2013 311  150 - 400 K/uL Final  . Sodium 10/31/2013 140  137 - 147 mEq/L Final  .  Potassium 10/31/2013 4.5  3.7 - 5.3 mEq/L Final  . Chloride 10/31/2013 97  96 - 112 mEq/L Final  . CO2 10/31/2013 30  19 - 32 mEq/L Final  . Glucose, Bld 10/31/2013 148* 70 - 99 mg/dL Final  . BUN 10/31/2013 24* 6 - 23 mg/dL Final  . Creatinine, Ser 10/31/2013 1.25* 0.50 - 1.10 mg/dL Final  . Calcium 10/31/2013 9.3  8.4 - 10.5 mg/dL Final  . GFR calc non Af Amer 10/31/2013 43* >90 mL/min Final  . GFR calc Af Amer 10/31/2013 50* >90 mL/min Final   Comment: (NOTE)                          The eGFR has been calculated using the CKD EPI equation.                          This calculation has not been validated in all clinical situations.                          eGFR's persistently <90 mL/min signify possible Chronic Kidney                          Disease.  . Anion gap 10/31/2013 13  5 - 15 Final  . Glucose-Capillary 10/30/2013 224* 70 - 99 mg/dL Final  . Glucose-Capillary 10/31/2013 140* 70 - 99 mg/dL Final  . Glucose-Capillary 10/31/2013 147* 70 - 99 mg/dL Final  . WBC 11/01/2013 6.7  4.0 - 10.5 K/uL Final  . RBC 11/01/2013 3.07* 3.87 - 5.11 MIL/uL Final  . Hemoglobin 11/01/2013 8.6* 12.0 - 15.0 g/dL Final  . HCT 11/01/2013 26.0* 36.0 - 46.0 % Final  . MCV 11/01/2013 84.7  78.0 - 100.0 fL Final  . MCH 11/01/2013 28.0  26.0 - 34.0 pg Final  . MCHC 11/01/2013 33.1  30.0 - 36.0 g/dL Final  . RDW 11/01/2013 15.3  11.5 - 15.5 % Final  . Platelets 11/01/2013 345  150 - 400 K/uL Final  . Glucose-Capillary 10/31/2013 141* 70 - 99 mg/dL Final  . Glucose-Capillary 10/31/2013 160* 70 - 99 mg/dL Final  . Glucose-Capillary 11/01/2013 121* 70 -  99 mg/dL Final  Hospital Outpatient Visit on 10/23/2013  Component Date Value Ref Range Status  . aPTT 10/23/2013 28  24 - 37 seconds Final  . WBC 10/23/2013 6.5  4.0 - 10.5 K/uL Final  . RBC 10/23/2013 3.94  3.87 - 5.11 MIL/uL Final  . Hemoglobin 10/23/2013 11.1* 12.0 - 15.0 g/dL Final  . HCT 10/23/2013 33.7* 36.0 - 46.0 % Final  . MCV 10/23/2013 85.5   78.0 - 100.0 fL Final  . MCH 10/23/2013 28.2  26.0 - 34.0 pg Final  . MCHC 10/23/2013 32.9  30.0 - 36.0 g/dL Final  . RDW 10/23/2013 14.7  11.5 - 15.5 % Final  . Platelets 10/23/2013 385  150 - 400 K/uL Final  . Sodium 10/23/2013 138  137 - 147 mEq/L Final  . Potassium 10/23/2013 4.6  3.7 - 5.3 mEq/L Final  . Chloride 10/23/2013 98  96 - 112 mEq/L Final  . CO2 10/23/2013 27  19 - 32 mEq/L Final  . Glucose, Bld 10/23/2013 134* 70 - 99 mg/dL Final  . BUN 10/23/2013 17  6 - 23 mg/dL Final  . Creatinine, Ser 10/23/2013 1.14* 0.50 - 1.10 mg/dL Final  . Calcium 10/23/2013 10.2  8.4 - 10.5 mg/dL Final  . Total Protein 10/23/2013 7.5  6.0 - 8.3 g/dL Final  . Albumin 10/23/2013 4.1  3.5 - 5.2 g/dL Final  . AST 10/23/2013 11  0 - 37 U/L Final  . ALT 10/23/2013 9  0 - 35 U/L Final  . Alkaline Phosphatase 10/23/2013 84  39 - 117 U/L Final  . Total Bilirubin 10/23/2013 0.3  0.3 - 1.2 mg/dL Final  . GFR calc non Af Amer 10/23/2013 48* >90 mL/min Final  . GFR calc Af Amer 10/23/2013 56* >90 mL/min Final   Comment: (NOTE)                          The eGFR has been calculated using the CKD EPI equation.                          This calculation has not been validated in all clinical situations.                          eGFR's persistently <90 mL/min signify possible Chronic Kidney                          Disease.  . Anion gap 10/23/2013 13  5 - 15 Final  . Prothrombin Time 10/23/2013 12.5  11.6 - 15.2 seconds Final  . INR 10/23/2013 0.93  0.00 - 1.49 Final  . ABO/RH(D) 10/23/2013 O NEG   Final  . Antibody Screen 10/23/2013 NEG   Final  . Sample Expiration 10/23/2013 11/01/2013   Final  . Color, Urine 10/23/2013 YELLOW  YELLOW Final  . APPearance 10/23/2013 CLEAR  CLEAR Final  . Specific Gravity, Urine 10/23/2013 1.021  1.005 - 1.030 Final  . pH 10/23/2013 5.0  5.0 - 8.0 Final  . Glucose, UA 10/23/2013 NEGATIVE  NEGATIVE mg/dL Final  . Hgb urine dipstick 10/23/2013 NEGATIVE  NEGATIVE Final  .  Bilirubin Urine 10/23/2013 NEGATIVE  NEGATIVE Final  . Ketones, ur 10/23/2013 NEGATIVE  NEGATIVE mg/dL Final  . Protein, ur 10/23/2013 NEGATIVE  NEGATIVE mg/dL Final  . Urobilinogen, UA 10/23/2013 0.2  0.0 - 1.0 mg/dL Final  .  Nitrite 10/23/2013 NEGATIVE  NEGATIVE Final  . Leukocytes, UA 10/23/2013 MODERATE* NEGATIVE Final  . MRSA, PCR 10/23/2013 NEGATIVE  NEGATIVE Final  . Staphylococcus aureus 10/23/2013 NEGATIVE  NEGATIVE Final   Comment:                                 The Xpert SA Assay (FDA                          approved for NASAL specimens                          in patients over 1 years of age),                          is one component of                          a comprehensive surveillance                          program.  Test performance has                          been validated by [employer] for patients greater                          than or equal to 9 year old.                          It is not intended                          to diagnose infection nor to                          guide or monitor treatment.  . Squamous Epithelial / LPF 10/23/2013 RARE  RARE Final  . WBC, UA 10/23/2013 3-6  <3 WBC/hpf Final  . Bacteria, UA 10/23/2013 RARE  RARE Final  . Edwina Barth 10/23/2013 MUCOUS PRESENT   Final     X-Rays:No results found.  EKG: Orders placed during the hospital encounter of 05/24/13  . EKG     Hospital Course: Vanessa Frank is a 68 y.o. who was admitted to [facility]. They were brought to the operating room on 10/29/2013 and underwent Procedure(s): RIGHT TOTAL KNEE ARTHROPLASTY.  Patient tolerated the procedure well and was later transferred to the recovery room and then to the orthopaedic floor for postoperative care.  They were given PO and IV analgesics for pain control following their surgery.  They were given 24 hours of postoperative antibiotics of  Anti-infectives   Start     Dose/Rate Route Frequency  Ordered Stop   10/29/13 1600  ceFAZolin (ANCEF) IVPB 2 g/50 mL premix     2 g 100 mL/hr over 30 Minutes Intravenous Every 6 hours 10/29/13 1304 10/29/13 2150   10/29/13 0700  ceFAZolin (ANCEF) IVPB 2 g/50 mL premix  2 g 100 mL/hr over 30 Minutes Intravenous On call to O.R. 10/29/13 0701 10/29/13 0931     and started on DVT prophylaxis in the form of Xarelto.   PT and OT were ordered for total joint protocol.  Discharge planning consulted to help with postop disposition and equipment needs.  Patient had a good night on the evening of surgery.  Social worker got involved to assist with placement. They started to get up OOB with therapy on day one. Hemovac drain was pulled without difficulty.  Continued to work with therapy into day two.  Dressing was changed on day two and the incision was healing well.  By day three, the patient had progressed with therapy and meeting their goals.  Incision was healing well.  Patient was seen in rounds and was ready to go to [facility].   Discharge to SNF - Joy and Diabetic diet  Follow up - next Thursday on July 23rd  Activity - [facility]AT  Disposition - Skilled nursing facility  Condition Upon Discharge - Good  D/C Meds - See DC Summary  DVT Prophylaxis - Xarelto        Discharge Instructions   Call MD / Call 911    Complete by:  As directed   If you experience chest pain or shortness of breath, CALL 911 and be transported to the hospital emergency room.  If you develope a fever above 101 F, pus (white drainage) or increased drainage or redness at the wound, or calf pain, call your surgeon's office.     Change dressing    Complete by:  As directed   Change dressing daily with sterile 4 x 4 inch gauze dressing and apply TED hose. Do not submerge the incision under water.     Constipation Prevention    Complete by:  As directed   Drink plenty of fluids.  Prune juice may be helpful.  You may use a stool softener, such as  Colace (over the counter) 100 mg twice a day.  Use MiraLax (over the counter) for constipation as needed.     Diet - low sodium heart healthy    Complete by:  As directed      Diet Carb Modified    Complete by:  As directed      Discharge instructions    Complete by:  As directed   Pick up stool softner and laxative for home. Do not submerge incision under water. May shower. Continue to use ice for pain and swelling from surgery.  Take Xarelto for two and a half more weeks, then discontinue Xarelto. Once the patient has completed the blood thinner regimen, then take a Baby 81 mg Aspirin daily for three more weeks.     Do not put a pillow under the knee. Place it under the heel.    Complete by:  As directed      Do not sit on low chairs, stoools or toilet seats, as it may be difficult to get up from low surfaces    Complete by:  As directed      Driving restrictions    Complete by:  As directed   No driving until released by the physician.     Increase activity slowly as tolerated    Complete by:  As directed      Lifting restrictions    Complete by:  As directed   No lifting until released by the physician.  Patient may shower    Complete by:  As directed   You may shower without a dressing once there is no drainage.  Do not wash over the wound.  If drainage remains, do not shower until drainage stops.     TED hose    Complete by:  As directed   Use stockings (TED hose) for 3 weeks on both leg(s).  You may remove them at night for sleeping.     Weight bearing as tolerated    Complete by:  As directed             Medication List    STOP taking these medications       ibuprofen 200 MG tablet  Commonly known as:  ADVIL,MOTRIN     oxyCODONE-acetaminophen 10-325 MG per tablet  Commonly known as:  PERCOCET      TAKE these medications       acetaminophen 325 MG tablet  Commonly known as:  TYLENOL  Take 2 tablets (650 mg total) by mouth every 6 (six) hours as needed  for mild pain (or Fever >/= 101).     atorvastatin 10 MG tablet  Commonly known as:  LIPITOR  Take 10 mg by mouth at bedtime.     bisacodyl 10 MG suppository  Commonly known as:  DULCOLAX  Place 1 suppository (10 mg total) rectally daily as needed for moderate constipation.     carboxymethylcellulose 0.5 % Soln  Commonly known as:  REFRESH PLUS  Place 1 drop into both eyes 3 (three) times daily as needed (dry eyes).     DSS 100 MG Caps  Take 100 mg by mouth 2 (two) times daily.     esomeprazole 20 MG capsule  Commonly known as:  NEXIUM  Take 20 mg by mouth every morning.     esomeprazole 20 MG capsule  Commonly known as:  NEXIUM  Take 1 capsule (20 mg total) by mouth daily.     furosemide 40 MG tablet  Commonly known as:  LASIX  Take 40 mg by mouth every morning.     iron polysaccharides 150 MG capsule  Commonly known as:  NIFEREX  Take 1 capsule (150 mg total) by mouth 2 (two) times daily.     lisinopril 10 MG tablet  Commonly known as:  PRINIVIL,ZESTRIL  Take 10 mg by mouth every morning.     metFORMIN 1000 MG tablet  Commonly known as:  GLUCOPHAGE  Take 1,000 mg by mouth 2 (two) times daily with a meal.     methocarbamol 500 MG tablet  Commonly known as:  ROBAXIN  Take 1 tablet (500 mg total) by mouth every 6 (six) hours as needed for muscle spasms.     metoCLOPramide 10 MG tablet  Commonly known as:  REGLAN  Take 10 mg by mouth 3 (three) times daily before meals.     ondansetron 4 MG tablet  Commonly known as:  ZOFRAN  Take 1 tablet (4 mg total) by mouth every 6 (six) hours as needed for nausea.     oxyCODONE 5 MG immediate release tablet  Commonly known as:  Oxy IR/ROXICODONE  Take 1-4 tablets (5-20 mg total) by mouth every 3 (three) hours as needed for moderate pain, severe pain or breakthrough pain.     pioglitazone 30 MG tablet  Commonly known as:  ACTOS  Take 30 mg by mouth every morning.     polyethylene glycol packet  Commonly known as:   MIRALAX /  GLYCOLAX  Take 17 g by mouth daily as needed for mild constipation or moderate constipation.     rivaroxaban 10 MG Tabs tablet  Commonly known as:  XARELTO  - Take 1 tablet (10 mg total) by mouth daily with breakfast. Take Xarelto for two and a half more weeks, then discontinue Xarelto.  - Once the patient has completed the blood thinner regimen, then take a Baby 81 mg Aspirin daily for three more weeks.     sertraline 100 MG tablet  Commonly known as:  ZOLOFT  Take 100 mg by mouth every morning.       Follow-up Information   Follow up with Gearlean Alf, MD. Schedule an appointment as soon as possible for a visit on 11/08/2013. (Please call office for follow up on next Thursday 11/08/2013.)    Specialty:  Orthopedic Surgery   Contact information:   [address] [place] [address] [phone]       Signed: Arlee Muslim, PA-C Orthopaedic Surgery 11/01/2013, 8:53 AM

## 2013-11-01 NOTE — Progress Notes (Signed)
Clinical Social Work Department CLINICAL SOCIAL WORK PLACEMENT NOTE 11/01/2013  Patient:  Vanessa HanlonSTEED,Vanessa R  Account Number:  [id number] Admit date:  10/29/2013  Clinical Social Worker:  Cori RazorJAMIE Ahamed Hofland, LCSW  Date/time:  10/29/2013 03:46 PM  Clinical Social Work is seeking post-discharge placement for this patient at the following level of care:   SKILLED NURSING   (*CSW will update this form in Epic as items are completed)     [facility]/family provided with [facility] Department of Clinical Social Work's list of facilities offering this level of care within the geographic area requested by the patient (or if unable, by the patient's family).  10/29/2013  Patient/family informed of their freedom to choose among providers that offer the needed level of care, that participate in Medicare, Medicaid or managed care program needed by the patient, have an available bed and are willing to accept the patient.    Patient/family informed of [facility]' ownership interest in [facility], as well as of the fact that they are under no obligation to receive care at this facility.  PASARR submitted to EDS on 10/29/2013 PASARR number received on 10/29/2013  FL2 transmitted to all facilities in geographic area requested by pt/family on  10/29/2013 FL2 transmitted to all facilities within larger geographic area on   Patient informed that his/her managed care company has contracts with or will negotiate with  certain facilities, including the following:     Patient/family informed of bed offers received:  10/29/2013 [facility] Physician recommends and patient chooses bed at    [facility]  11/01/2013 Patient to be transferred to facility by FAMILY Patient and family notified of transfer on 11/01/2013 Name of family member notified:  SPOUSE  The following physician request were entered in Epic:   Additional Comments: Pt / spouse  are in agreement with d/c to SNF today. PT consulted and felt pt could  transport by car to SNF. NSG reviewed d/c summary,scripts,avs. Scripts are included in d/c packet.  Cori RazorJamie Dashawn Golda LCSW [phone]

## 2013-11-06 ENCOUNTER — Non-Acute Institutional Stay (SKILLED_NURSING_FACILITY): Payer: Medicare Other | Admitting: Internal Medicine

## 2013-11-06 DIAGNOSIS — D62 Acute posthemorrhagic anemia: Secondary | ICD-10-CM

## 2013-11-06 DIAGNOSIS — M1710 Unilateral primary osteoarthritis, unspecified knee: Secondary | ICD-10-CM

## 2013-11-06 DIAGNOSIS — I10 Essential (primary) hypertension: Secondary | ICD-10-CM

## 2013-11-06 DIAGNOSIS — M1711 Unilateral primary osteoarthritis, right knee: Secondary | ICD-10-CM

## 2013-11-06 DIAGNOSIS — E119 Type 2 diabetes mellitus without complications: Secondary | ICD-10-CM

## 2013-11-06 NOTE — Progress Notes (Signed)
HISTORY & PHYSICAL  DATE: 11/06/2013   FACILITY: [facility]  LEVEL OF CARE: SNF (31)  ALLERGIES:  Allergies  Allergen Reactions  . Adhesive [Tape] Itching  . Codeine Nausea Only  . Latex Itching    CHIEF COMPLAINT:  Manage right knee osteoarthritis, diabetes mellitus and hypertension  HISTORY OF PRESENT ILLNESS: Patient is a 68 year old Caucasian female.  KNEE OSTEOARTHRITIS: Patient had a history of pain and functional disability in the knee due to end-stage osteoarthritis and has failed nonsurgical conservative treatments. Patient had worsening of pain with activity and weight bearing, pain that interfered with activities of daily living & pain with passive range of motion. Therefore patient underwent total knee arthroplasty and tolerated the procedure well. Patient is admitted to this facility for sort short-term rehabilitation. Patient denies knee pain.  DM:pt's DM remains stable.  Pt denies polyuria, polydipsia, polyphagia, changes in vision or hypoglycemic episodes.  No complications noted from the medication presently being used.  Last hemoglobin A1c is not available.  HTN: Pt 's HTN remains stable.  Denies CP, sob, DOE, pedal edema, headaches, dizziness or visual disturbances.  No complications from the medications currently being used.  Last BP : 119/65.  PAST MEDICAL HISTORY :  Past Medical History  Diagnosis Date  . Hypertension   . Dysrhythmia IRREGULAR  RATE IN HER 20'S  . Diabetes mellitus ORAL MEDS ONLY  . GERD (gastroesophageal reflux disease)   . Headache(784.0)   . Arthritis   . Anxiety   . Shortness of breath WITH EXERTION  . Fibromyalgia   . Neuropathy     TOES  . Stress incontinence   . Palpitations     OCCASIONAL    PAST SURGICAL HISTORY: Past Surgical History  Procedure Laterality Date  . Abdominal hysterectomy  80'S    Partial   . Cholecystectomy  1989  . Bartholin gland cyst excision  1983  . Arthroscopy  knee w/ drilling  09 LEFT   2012 RIGHT  . Cystoscopy  80'S  . Anterior lat lumbar fusion  03/26/2011    Procedure: ANTERIOR LATERAL LUMBAR FUSION 1 LEVEL;  Surgeon: Tia Alert;  Location: [facility];  Service: Neurosurgery;  Laterality: Left;  Left Lumbar three-four anterolateral retroperitoneal interbody fusion   . Total knee arthroplasty Left 05/28/2013    Procedure: LEFT TOTAL KNEE ARTHROPLASTY;  Surgeon: Loanne Drilling, MD;  Location: [facility];  Service: Orthopedics;  Laterality: Left;  . Spinal fusion  2012    Lower Back  . Foot surgery Right   . Total knee arthroplasty Right 10/29/2013    Procedure: RIGHT TOTAL KNEE ARTHROPLASTY;  Surgeon: Loanne Drilling, MD;  Location: [facility];  Service: Orthopedics;  Laterality: Right;    SOCIAL HISTORY:  reports that she has never smoked. She has never used smokeless tobacco. She reports that she does not drink alcohol or use illicit drugs.  FAMILY HISTORY:  Family History  Problem Relation Age of Onset  . Depression Maternal Grandmother   . Depression Mother   . Depression Sister   . Congestive Heart Failure Mother   . Congestive Heart Failure Father   . Allergy (severe) Mother   . Heart disease Mother   . Alcoholism Mother   . Hypertension Mother   . Rheum arthritis Paternal Grandmother   . Osteoarthritis Mother   . Osteoarthritis Sister   . Osteoarthritis Maternal Grandmother   . Osteoarthritis Paternal Grandmother   .  Cancer Father   . Clotting disorder Sister     CURRENT MEDICATIONS: Reviewed per MAR/see medication list  REVIEW OF SYSTEMS:  See HPI otherwise 14 point ROS is negative.  PHYSICAL EXAMINATION  VS:  See VS section  GENERAL: no acute distress, morbidly obese body habitus EYES: conjunctivae normal, sclerae normal, normal eye lids MOUTH/THROAT: lips without lesions,no lesions in the mouth,tongue is without lesions,uvula elevates in midline NECK: supple, trachea midline, no neck masses, no thyroid tenderness, no  thyromegaly LYMPHATICS: no LAN in the neck, no supraclavicular LAN RESPIRATORY: breathing is even & unlabored, BS CTAB CARDIAC: RRR, no murmur,no extra heart sounds, +2 bilateral lower extremely edema GI:  ABDOMEN: abdomen soft, normal BS, no masses, no tenderness  LIVER/SPLEEN: no hepatomegaly, no splenomegaly MUSCULOSKELETAL: HEAD: normal to inspection  EXTREMITIES: LEFT UPPER EXTREMITY: full range of motion, normal strength & tone RIGHT UPPER EXTREMITY:  full range of motion, normal strength & tone LEFT LOWER EXTREMITY:  Moderate range of motion, normal strength & tone RIGHT LOWER EXTREMITY:   range of motion not tested due to surgery, normal strength & tone PSYCHIATRIC: the patient is alert & oriented to person, affect & behavior appropriate  LABS/RADIOLOGY:  Labs reviewed: Basic Metabolic Panel:  Recent Labs  14/78/2905/10/31 1530 10/30/13 0438 10/31/13 0458  NA 138 138 140  K 4.6 3.8 4.5  CL 98 98 97  CO2 27 25 30   GLUCOSE 134* 130* 148*  BUN 17 16 24*  CREATININE 1.14* 1.18* 1.25*  CALCIUM 10.2 9.0 9.3   Liver Function Tests:  Recent Labs  05/24/13 1505 10/23/13 1530  AST 11 11  ALT 12 9  ALKPHOS 100 84  BILITOT 0.3 0.3  PROT 7.8 7.5  ALBUMIN 4.3 4.1   CBC:  Recent Labs  10/30/13 0438 10/31/13 0458 11/01/13 0437  WBC 8.7 8.0 6.7  HGB 8.9* 8.6* 8.6*  HCT 26.0* 25.6* 26.0*  MCV 85.2 84.8 84.7  PLT 313 311 345   CBG:  Recent Labs  10/31/13 1756 10/31/13 2058 11/01/13 0724  GLUCAP 141* 160* 121*    ASSESSMENT/PLAN:  Right knee osteoarthritis-status post total knee arthroplasty. Continue rehabilitation. Diabetes mellitus-continue medications Hypertension-well-controlled Acute blood loss anemia-check CBC. Continue iron. GERD-continue Nexium Constipation-continue MiraLax  I have reviewed patient's medical records received at admission/from hospitalization.  CPT CODE: [id number]  Angela CoxGayani Y Dasanayaka, MD [facility] [phone]

## 2013-11-08 ENCOUNTER — Non-Acute Institutional Stay (SKILLED_NURSING_FACILITY): Payer: Medicare Other | Admitting: Adult Health

## 2013-11-08 ENCOUNTER — Encounter: Payer: Self-pay | Admitting: Adult Health

## 2013-11-08 DIAGNOSIS — IMO0001 Reserved for inherently not codable concepts without codable children: Secondary | ICD-10-CM

## 2013-11-08 DIAGNOSIS — K219 Gastro-esophageal reflux disease without esophagitis: Secondary | ICD-10-CM

## 2013-11-08 DIAGNOSIS — F32A Depression, unspecified: Secondary | ICD-10-CM

## 2013-11-08 DIAGNOSIS — M1710 Unilateral primary osteoarthritis, unspecified knee: Secondary | ICD-10-CM

## 2013-11-08 DIAGNOSIS — F329 Major depressive disorder, single episode, unspecified: Secondary | ICD-10-CM

## 2013-11-08 DIAGNOSIS — F3289 Other specified depressive episodes: Secondary | ICD-10-CM

## 2013-11-08 DIAGNOSIS — D62 Acute posthemorrhagic anemia: Secondary | ICD-10-CM | POA: Insufficient documentation

## 2013-11-08 DIAGNOSIS — E1165 Type 2 diabetes mellitus with hyperglycemia: Secondary | ICD-10-CM

## 2013-11-08 DIAGNOSIS — E785 Hyperlipidemia, unspecified: Secondary | ICD-10-CM

## 2013-11-08 DIAGNOSIS — I10 Essential (primary) hypertension: Secondary | ICD-10-CM

## 2013-11-08 DIAGNOSIS — M1711 Unilateral primary osteoarthritis, right knee: Secondary | ICD-10-CM

## 2013-11-08 NOTE — Progress Notes (Signed)
Patient ID: Vanessa HanlonLinda R Saran, female   DOB: 11-03-1945, 68 y.o.   MRN: [id number]              PROGRESS NOTE  DATE: 11/08/2013   [facility]  LEVEL OF CARE: SNF (31)  Acute Visit  CHIEF COMPLAINT:  Discharge Notes  HISTORY OF PRESENT ILLNESS: This is a 68 year old female who is for discharge home. [facility] with right knee osteoarthritis S/P right total knee arthroplasty. Patient was admitted to this facility for short-term rehabilitation after the patient's recent hospitalization.  Patient has completed SNF rehabilitation and therapy has cleared the patient for discharge.  Reassessment of ongoing problem(s):  HTN: Pt 's HTN remains stable.  Denies CP, sob, DOE, pedal edema, headaches, dizziness or visual disturbances.  No complications from the medications currently being used.  Last BP : 119/65  ANEMIA: The anemia has been stable. The patient denies fatigue, melena or hematochezia. No complications from the medications currently being used. 7/15 hgb 8.6  GERD: pt's GERD is stable.  Denies ongoing heartburn, abd. Pain, nausea or vomiting.  Currently on a PPI & tolerates it without any adverse reactions.   PAST MEDICAL HISTORY : Reviewed.  No changes/see problem list  CURRENT MEDICATIONS: Reviewed per MAR/see medication list  REVIEW OF SYSTEMS:  GENERAL: no change in appetite, no fatigue, no weight changes, no fever, chills or weakness RESPIRATORY: no cough, SOB, DOE, wheezing, hemoptysis CARDIAC: no chest pain, or palpitations, +edema GI: no abdominal pain, diarrhea, constipation, heart burn, nausea or vomiting  PHYSICAL EXAMINATION  GENERAL: no acute distress, normal body habitus EYES: conjunctivae normal, sclerae normal, normal eye lids NECK: supple, trachea midline, no neck masses, no thyroid tenderness, no thyromegaly LYMPHATICS: no LAN in the neck, no supraclavicular  LAN RESPIRATORY: breathing is even & unlabored, BS CTAB CARDIAC: RRR, no murmur,no extra heart sounds, RLE edema 2+ GI: abdomen soft, normal BS, no masses, no tenderness, no hepatomegaly, no splenomegaly EXTREMITIES: able to move all 4 extremities PSYCHIATRIC: the patient is alert & oriented to person, affect & behavior appropriate  LABS/RADIOLOGY: Labs reviewed: Basic Metabolic Panel:  Recent Labs  44/04/205/07/15 1530 10/30/13 0438 10/31/13 0458  NA 138 138 140  K 4.6 3.8 4.5  CL 98 98 97  CO2 27 25 30   GLUCOSE 134* 130* 148*  BUN 17 16 24*  CREATININE 1.14* 1.18* 1.25*  CALCIUM 10.2 9.0 9.3   Liver Function Tests:  Recent Labs  05/24/13 1505 10/23/13 1530  AST 11 11  ALT 12 9  ALKPHOS 100 84  BILITOT 0.3 0.3  PROT 7.8 7.5  ALBUMIN 4.3 4.1   CBC:  Recent Labs  10/30/13 0438 10/31/13 0458 11/01/13 0437  WBC 8.7 8.0 6.7  HGB 8.9* 8.6* 8.6*  HCT 26.0* 25.6* 26.0*  MCV 85.2 84.8 84.7  PLT 313 311 345   CBG:  Recent Labs  10/31/13 1756 10/31/13 2058 11/01/13 0724  GLUCAP 141* 160* 121*     ASSESSMENT/PLAN:  Right knee osteoarthritis S/P right total knee arthroplasty - recently finished rehabilitation and now for discharge home Diabetes mellitus, type II - well controlled; continue Glucophage and Actos Hyperlipidemia - continue Lipitor GERD - continue Nexium and Reglan Anemia, acute blood loss - continue Niferex Hypertension - well controlled; continue Prinivil Depression - continue Zoloft   I have filled out patient's discharge paperwork and written prescriptions.    Total discharge time: Less than  30 minutes  Discharge time involved coordination of the discharge process with Child psychotherapist, nursing staff and therapy department. Medical justification for home health services verified.  CPT CODE: 24401  Ella Bodo - NP [facility] [phone]

## 2013-11-16 ENCOUNTER — Other Ambulatory Visit: Payer: Self-pay | Admitting: Adult Health

## 2013-11-17 ENCOUNTER — Other Ambulatory Visit: Payer: Self-pay | Admitting: Adult Health

## 2013-11-20 ENCOUNTER — Ambulatory Visit: Payer: Medicare Other | Attending: Specialist | Admitting: Physical Therapy

## 2013-11-20 DIAGNOSIS — M25519 Pain in unspecified shoulder: Secondary | ICD-10-CM | POA: Insufficient documentation

## 2013-11-20 DIAGNOSIS — IMO0001 Reserved for inherently not codable concepts without codable children: Secondary | ICD-10-CM | POA: Insufficient documentation

## 2013-11-20 DIAGNOSIS — I10 Essential (primary) hypertension: Secondary | ICD-10-CM | POA: Insufficient documentation

## 2013-11-20 DIAGNOSIS — M25619 Stiffness of unspecified shoulder, not elsewhere classified: Secondary | ICD-10-CM | POA: Insufficient documentation

## 2013-11-20 DIAGNOSIS — E119 Type 2 diabetes mellitus without complications: Secondary | ICD-10-CM | POA: Diagnosis not present

## 2013-11-22 ENCOUNTER — Ambulatory Visit: Payer: Medicare Other | Admitting: Physical Therapy

## 2013-11-22 DIAGNOSIS — IMO0001 Reserved for inherently not codable concepts without codable children: Secondary | ICD-10-CM | POA: Diagnosis not present

## 2013-11-26 ENCOUNTER — Ambulatory Visit: Payer: Medicare Other | Admitting: Physical Therapy

## 2013-11-26 DIAGNOSIS — IMO0001 Reserved for inherently not codable concepts without codable children: Secondary | ICD-10-CM | POA: Diagnosis not present

## 2013-11-28 ENCOUNTER — Ambulatory Visit: Payer: Medicare Other | Admitting: Physical Therapy

## 2013-11-28 DIAGNOSIS — IMO0001 Reserved for inherently not codable concepts without codable children: Secondary | ICD-10-CM | POA: Diagnosis not present

## 2013-11-30 ENCOUNTER — Ambulatory Visit: Payer: Medicare Other | Admitting: Physical Therapy

## 2013-11-30 DIAGNOSIS — IMO0001 Reserved for inherently not codable concepts without codable children: Secondary | ICD-10-CM | POA: Diagnosis not present

## 2013-12-03 ENCOUNTER — Ambulatory Visit: Payer: Medicare Other | Admitting: Physical Therapy

## 2013-12-03 DIAGNOSIS — IMO0001 Reserved for inherently not codable concepts without codable children: Secondary | ICD-10-CM | POA: Diagnosis not present

## 2013-12-05 ENCOUNTER — Ambulatory Visit: Payer: Medicare Other | Admitting: Physical Therapy

## 2013-12-05 DIAGNOSIS — IMO0001 Reserved for inherently not codable concepts without codable children: Secondary | ICD-10-CM | POA: Diagnosis not present

## 2013-12-06 ENCOUNTER — Ambulatory Visit: Payer: Medicare Other | Admitting: Physical Therapy

## 2013-12-10 ENCOUNTER — Ambulatory Visit: Payer: Medicare Other | Admitting: Physical Therapy

## 2013-12-10 DIAGNOSIS — IMO0001 Reserved for inherently not codable concepts without codable children: Secondary | ICD-10-CM | POA: Diagnosis not present

## 2013-12-12 ENCOUNTER — Ambulatory Visit: Payer: Medicare Other | Admitting: Physical Therapy

## 2013-12-12 DIAGNOSIS — IMO0001 Reserved for inherently not codable concepts without codable children: Secondary | ICD-10-CM | POA: Diagnosis not present

## 2013-12-13 ENCOUNTER — Ambulatory Visit: Payer: Medicare Other | Admitting: Physical Therapy

## 2013-12-14 ENCOUNTER — Ambulatory Visit: Payer: Medicare Other | Admitting: Physical Therapy

## 2013-12-17 ENCOUNTER — Ambulatory Visit: Payer: Medicare Other | Admitting: Physical Therapy

## 2013-12-19 ENCOUNTER — Ambulatory Visit: Payer: Medicare Other | Attending: Specialist | Admitting: Physical Therapy

## 2013-12-19 DIAGNOSIS — M25619 Stiffness of unspecified shoulder, not elsewhere classified: Secondary | ICD-10-CM | POA: Insufficient documentation

## 2013-12-19 DIAGNOSIS — I10 Essential (primary) hypertension: Secondary | ICD-10-CM | POA: Diagnosis not present

## 2013-12-19 DIAGNOSIS — E119 Type 2 diabetes mellitus without complications: Secondary | ICD-10-CM | POA: Insufficient documentation

## 2013-12-19 DIAGNOSIS — IMO0001 Reserved for inherently not codable concepts without codable children: Secondary | ICD-10-CM | POA: Insufficient documentation

## 2013-12-19 DIAGNOSIS — M25519 Pain in unspecified shoulder: Secondary | ICD-10-CM | POA: Diagnosis not present

## 2013-12-20 ENCOUNTER — Ambulatory Visit: Payer: Medicare Other | Admitting: Physical Therapy

## 2013-12-20 DIAGNOSIS — IMO0001 Reserved for inherently not codable concepts without codable children: Secondary | ICD-10-CM | POA: Diagnosis not present

## 2013-12-25 ENCOUNTER — Ambulatory Visit: Payer: Medicare Other | Admitting: Physical Therapy

## 2013-12-27 ENCOUNTER — Ambulatory Visit: Payer: Medicare Other | Admitting: Physical Therapy

## 2013-12-31 ENCOUNTER — Ambulatory Visit: Payer: Medicare Other | Admitting: Physical Therapy

## 2013-12-31 ENCOUNTER — Other Ambulatory Visit: Payer: Self-pay | Admitting: Adult Health

## 2013-12-31 DIAGNOSIS — IMO0001 Reserved for inherently not codable concepts without codable children: Secondary | ICD-10-CM | POA: Diagnosis not present

## 2014-01-02 ENCOUNTER — Ambulatory Visit: Payer: Medicare Other | Admitting: Physical Therapy

## 2014-01-02 DIAGNOSIS — IMO0001 Reserved for inherently not codable concepts without codable children: Secondary | ICD-10-CM | POA: Diagnosis not present

## 2014-01-07 ENCOUNTER — Ambulatory Visit: Payer: Medicare Other | Admitting: Physical Therapy

## 2014-01-07 DIAGNOSIS — IMO0001 Reserved for inherently not codable concepts without codable children: Secondary | ICD-10-CM | POA: Diagnosis not present

## 2014-01-09 ENCOUNTER — Ambulatory Visit: Payer: Medicare Other | Admitting: Physical Therapy

## 2014-01-14 ENCOUNTER — Ambulatory Visit: Payer: Medicare Other | Admitting: Physical Therapy

## 2014-01-14 DIAGNOSIS — IMO0001 Reserved for inherently not codable concepts without codable children: Secondary | ICD-10-CM | POA: Diagnosis not present

## 2014-01-16 ENCOUNTER — Ambulatory Visit: Payer: Medicare Other | Admitting: Physical Therapy

## 2014-01-16 DIAGNOSIS — IMO0001 Reserved for inherently not codable concepts without codable children: Secondary | ICD-10-CM | POA: Diagnosis not present

## 2014-01-21 ENCOUNTER — Ambulatory Visit: Payer: Medicare Other | Attending: Specialist | Admitting: Physical Therapy

## 2014-01-21 DIAGNOSIS — M7541 Impingement syndrome of right shoulder: Secondary | ICD-10-CM | POA: Insufficient documentation

## 2014-01-21 DIAGNOSIS — M25561 Pain in right knee: Secondary | ICD-10-CM | POA: Insufficient documentation

## 2014-01-21 DIAGNOSIS — R609 Edema, unspecified: Secondary | ICD-10-CM | POA: Diagnosis not present

## 2014-01-21 DIAGNOSIS — M25661 Stiffness of right knee, not elsewhere classified: Secondary | ICD-10-CM | POA: Insufficient documentation

## 2014-01-21 DIAGNOSIS — I10 Essential (primary) hypertension: Secondary | ICD-10-CM | POA: Insufficient documentation

## 2014-01-21 DIAGNOSIS — Z96651 Presence of right artificial knee joint: Secondary | ICD-10-CM | POA: Diagnosis not present

## 2014-01-21 DIAGNOSIS — E119 Type 2 diabetes mellitus without complications: Secondary | ICD-10-CM | POA: Diagnosis not present

## 2014-01-23 ENCOUNTER — Ambulatory Visit: Payer: Medicare Other | Admitting: Physical Therapy

## 2014-01-23 DIAGNOSIS — M25561 Pain in right knee: Secondary | ICD-10-CM | POA: Diagnosis not present

## 2014-01-30 ENCOUNTER — Ambulatory Visit: Payer: Medicare Other | Admitting: Physical Therapy

## 2014-03-10 ENCOUNTER — Other Ambulatory Visit: Payer: Self-pay | Admitting: Adult Health

## 2014-03-12 ENCOUNTER — Other Ambulatory Visit: Payer: Self-pay | Admitting: Adult Health

## 2014-03-13 ENCOUNTER — Other Ambulatory Visit: Payer: Self-pay | Admitting: Internal Medicine

## 2014-03-26 ENCOUNTER — Other Ambulatory Visit: Payer: Self-pay | Admitting: Neurological Surgery

## 2014-03-26 DIAGNOSIS — M545 Low back pain: Secondary | ICD-10-CM

## 2014-03-27 ENCOUNTER — Other Ambulatory Visit: Payer: Self-pay | Admitting: Neurological Surgery

## 2014-03-27 DIAGNOSIS — M545 Low back pain: Secondary | ICD-10-CM

## 2014-04-04 ENCOUNTER — Other Ambulatory Visit: Payer: Medicare Other

## 2014-04-10 ENCOUNTER — Other Ambulatory Visit: Payer: Self-pay | Admitting: Internal Medicine

## 2014-04-22 ENCOUNTER — Other Ambulatory Visit: Payer: Self-pay | Admitting: Adult Health

## 2014-04-26 ENCOUNTER — Other Ambulatory Visit: Payer: Self-pay | Admitting: Adult Health

## 2014-07-07 ENCOUNTER — Other Ambulatory Visit: Payer: Self-pay | Admitting: Internal Medicine

## 2014-07-11 ENCOUNTER — Other Ambulatory Visit: Payer: Self-pay | Admitting: Internal Medicine

## 2014-08-19 ENCOUNTER — Ambulatory Visit
Admission: RE | Admit: 2014-08-19 | Discharge: 2014-08-19 | Disposition: A | Discharge status: Home / Self Care | Payer: Medicare Other | Source: Ambulatory Visit | Attending: Neurological Surgery | Admitting: Neurological Surgery

## 2014-08-19 DIAGNOSIS — M545 Low back pain: Secondary | ICD-10-CM

## 2014-08-19 MED ORDER — DIAZEPAM 5 MG PO TABS
5.0000 mg | ORAL_TABLET | Freq: Once | ORAL | Status: AC
  Administered 2014-08-19: 5 mg via ORAL

## 2014-08-19 MED ORDER — ONDANSETRON 8 MG PO TBDP
8.0000 mg | ORAL_TABLET | Freq: Once | ORAL | Status: AC
  Administered 2014-08-19: 8 mg via ORAL

## 2014-08-19 MED ORDER — IOHEXOL 180 MG/ML  SOLN
15.0000 mL | Freq: Once | INTRAMUSCULAR | Status: AC | PRN
  Administered 2014-08-19: 15 mL via INTRATHECAL

## 2014-08-19 NOTE — Progress Notes (Signed)
Pt states she has been off Reglan and Zoloft since Friday. Discharge instructions explained to pt.

## 2014-08-19 NOTE — Discharge Instructions (Signed)
Myelogram Discharge Instructions  1. Go home and rest quietly for the next 24 hours.  It is important to lie flat for the next 24 hours.  Get up only to go to the restroom.  You may lie in the bed or on a couch on your back, your stomach, your left side or your right side.  You may have one pillow under your head.  You may have pillows between your knees while you are on your side or under your knees while you are on your back.  2. DO NOT drive today.  Recline the seat as far back as it will go, while still wearing your seat belt, on the way home.  3. You may get up to go to the bathroom as needed.  You may sit up for 10 minutes to eat.  You may resume your normal diet and medications unless otherwise indicated.  Drink lots of extra fluids today and tomorrow.  4. The incidence of headache, nausea, or vomiting is about 5% (one in 20 patients).  If you develop a headache, lie flat and drink plenty of fluids until the headache goes away.  Caffeinated beverages may be helpful.  If you develop severe nausea and vomiting or a headache that does not go away with flat bed rest, call [phone].  5. You may resume normal activities after your 24 hours of bed rest is over; however, do not exert yourself strongly or do any heavy lifting tomorrow. If when you get up you have a headache when standing, go back to bed and force fluids for another 24 hours.  6. Call your physician for a follow-up appointment.  The results of your myelogram will be sent directly to your physician by the following day.  7. If you have any questions or if complications develop after you arrive home, please call [phone].  Discharge instructions have been explained to the patient.  The patient, or the person responsible for the patient, fully understands these instructions.       May resume Reglan and Zoloft on Aug 20, 2014, after 1:00 pm.

## 2014-10-14 ENCOUNTER — Other Ambulatory Visit: Payer: Self-pay

## 2014-11-19 ENCOUNTER — Ambulatory Visit: Payer: Medicare Other | Admitting: Physical Therapy

## 2014-11-20 ENCOUNTER — Encounter: Payer: Self-pay | Admitting: Physical Therapy

## 2014-11-20 ENCOUNTER — Ambulatory Visit: Payer: Medicare Other | Attending: Neurological Surgery | Admitting: Physical Therapy

## 2014-11-20 DIAGNOSIS — M545 Low back pain, unspecified: Secondary | ICD-10-CM

## 2014-11-20 DIAGNOSIS — M546 Pain in thoracic spine: Secondary | ICD-10-CM | POA: Diagnosis present

## 2014-11-20 DIAGNOSIS — R2689 Other abnormalities of gait and mobility: Secondary | ICD-10-CM | POA: Diagnosis present

## 2014-11-20 DIAGNOSIS — M6281 Muscle weakness (generalized): Secondary | ICD-10-CM | POA: Insufficient documentation

## 2014-11-20 DIAGNOSIS — R29898 Other symptoms and signs involving the musculoskeletal system: Secondary | ICD-10-CM

## 2014-11-20 NOTE — Patient Instructions (Signed)
Knee-to-Chest: with Neck Flexion Stretch (Supine)   Pull left knee to chest, tucking chin and lifting head. Hold __10__ seconds. Relax. Repeat _10___ times per set. Do _2___ sets per session. Do __2__ sessions per day.  Trunk: Knees to Chest   Lie on firm, flat surface. Keep head and shoulders flat on surface. Tuck hands behind knees and pull to chest. Hold _10___ seconds. Repeat __10__ times. Do _2___ sessions per day. CAUTION: Movement should be gentle and slow.  Caudal Rotation: Hip Roll, Neutral Lordosis - Supine   Lie with knees bent and slightly elevated, feet flat. Tighten stomach, lower knees out to right side, rotating hips and trunk. Keep stomach tight for return. Repeat _10___ times per set. Do __2__ sets per session. Do _2___ sessions per week.  Pelvic Tilt: Anterior - Legs Bent (Supine)   Rotate pelvis up and arch back. Hold _1___ seconds. Relax. Repeat __10__ times per set. Do __2__ sets per session. Do __2__ sessions per day.  Piriformis Stretch   Lying on back, pull right knee toward opposite shoulder. Hold __30__ seconds. Repeat _4___ times. Do _2___ sessions per day.

## 2014-11-20 NOTE — Therapy (Signed)
[facility] [address] [place], [place], [address] Phone: [phone]   Fax:  [phone]  Physical Therapy Evaluation  Patient Details  Name: Vanessa Frank MRN: [id number] Date of Birth: [birth year] Referring Provider:  Tia Alert, MD  Encounter Date: 11/20/2014      PT End of Session - 11/20/14 1446    Visit Number 1   Date for PT Re-Evaluation 01/20/15   PT Start Time 1355   PT Stop Time 1456   PT Time Calculation (min) 61 min   Activity Tolerance Patient tolerated treatment well   Behavior During Therapy [facility] for tasks assessed/performed      Past Medical History  Diagnosis Date  . Hypertension   . Dysrhythmia IRREGULAR  RATE IN HER 20'S  . Diabetes mellitus ORAL MEDS ONLY  . GERD (gastroesophageal reflux disease)   . Headache(784.0)   . Arthritis   . Anxiety   . Shortness of breath WITH EXERTION  . Fibromyalgia   . Neuropathy     TOES  . Stress incontinence   . Palpitations     OCCASIONAL    Past Surgical History  Procedure Laterality Date  . Abdominal hysterectomy  80'S    Partial   . Cholecystectomy  1989  . Bartholin gland cyst excision  1983  . Arthroscopy knee w/ drilling  09 LEFT   2012 RIGHT  . Cystoscopy  80'S  . Anterior lat lumbar fusion  03/26/2011    Procedure: ANTERIOR LATERAL LUMBAR FUSION 1 LEVEL;  Surgeon: Tia Alert;  Location: [facility];  Service: Neurosurgery;  Laterality: Left;  Left Lumbar three-four anterolateral retroperitoneal interbody fusion   . Total knee arthroplasty Left 05/28/2013    Procedure: LEFT TOTAL KNEE ARTHROPLASTY;  Surgeon: Loanne Drilling, MD;  Location: [facility];  Service: Orthopedics;  Laterality: Left;  . Spinal fusion  2012    Lower Back  . Foot surgery Right   . Total knee arthroplasty Right 10/29/2013    Procedure: RIGHT TOTAL KNEE ARTHROPLASTY;  Surgeon: Loanne Drilling, MD;  Location: [facility];  Service: Orthopedics;  Laterality: Right;     There were no vitals filed for this visit.  Visit Diagnosis:  Midline low back pain without sciatica - Plan: PT plan of care cert/re-cert  Right-sided thoracic back pain - Plan: PT plan of care cert/re-cert  Decreased mobility - Plan: PT plan of care cert/re-cert  Weakness of back - Plan: PT plan of care cert/re-cert      Subjective Assessment - 11/20/14 1357    Subjective Pt states she is having upper back pain between her shoulders as well as lower back pain. Upper back pain started in the past 4 months, I have scoliosis.  Low back has bothered me for years, I have arthritis in my spine. It kills me to get things out of the dishwasher, it feels like my back is going to break in two, lower back.  I have an appt tomorrow to get my back injected.     Pertinent History Lumbar fusion L3/4, Bilat TKA-2015, DM   Limitations Sitting;Lifting;Standing;Walking;House hold activities   How long can you sit comfortably? 15 min   How long can you stand comfortably? 15 min   How long can you walk comfortably? 15 min   Diagnostic tests MRI/myelography   Patient Stated Goals decrease pain, increase exercise, ADL's-cooking, house stuff, walking   Currently in Pain? Yes   Pain Score  5   best 4/10, worst 10/10   Pain Location Thoracic   Pain Orientation Mid;Upper;Lower   Pain Descriptors / Indicators Aching;Tightness   Pain Radiating Towards pain in to L leg  just once though   Pain Onset More than a month ago   Aggravating Factors  standing cooking, unloading dishwasher   Pain Relieving Factors lying down, meds   Multiple Pain Sites Yes            [facility] PT Assessment - 11/20/14 0001    Assessment   Medical Diagnosis LBP/Thoracic pain   Onset Date/Surgical Date 07/21/14  for thoracic pain, chronic LBP   Next MD Visit 11/21/14   Prior Therapy none   Balance Screen   Has the patient fallen in the past 6 months No   Has the patient had a decrease in activity level because of a fear of  falling?  No   Is the patient reluctant to leave their home because of a fear of falling?  No   Home [employer]ist information centre manager residence   Living Arrangements Children;Spouse/significant other   Type of Home House   Home Access Stairs to enter   Entrance Stairs-Number of Steps 3   Entrance Stairs-Rails Right   Home Layout Two level   Prior Function   Level of Independence Independent   Vocation Retired   Leisure walking, reading, movies, chair exercises-for knee   Observation/Other Assessments   Observations skin in tact,no bruising   Posture/Postural Control   Posture Comments Left lean, forward head posture, rounded shoulders   AROM   Overall AROM Comments Shoulder motions WFL, trunk motions limited due to pain except rot, R/L SB  painful on R side, hip/knee/ankle WFL's   Strength   Overall Strength Comments Hip flex 4+/5 bilat due to pain in groin, Knee 5/5, ankle 5/5   Palpation   Palpation comment T8/9 TTP R paraspinals with adhesion, TTP midline L4-S3   Slump test   Findings Negative   Side Right   Comment negative bilat   Straight Leg Raise   Findings Negative   Side  Right   Comment negative Bilat                   [facility] Adult PT Treatment/Exercise - 11/20/14 0001    Modalities   Modalities Electrical Stimulation;Moist Heat   Moist Heat Therapy   Number Minutes Moist Heat 15 Minutes   Moist Heat Location Lumbar Spine  thoracic spine   Electrical Stimulation   Electrical Stimulation Location thoracic spine T8/9 bilat, L5/S1 bilat   Electrical Stimulation Action Premod   Electrical Stimulation Parameters to tolerance   Electrical Stimulation Goals Pain                PT Education - 11/20/14 1445    Education provided Yes   Education Details provided patient with exercises as well as posture education   Person(s) Educated Patient   Methods Explanation;Demonstration;Tactile cues;Verbal cues;Handout   Comprehension Verbalized  understanding          PT Short Term Goals - 11/20/14 1454    PT SHORT TERM GOAL #1   Title Pt will be ind with HEP   Time 2   Period Weeks   Status New           PT Long Term Goals - 11/20/14 1454    PT LONG TERM GOAL #1   Title Pt will increase strength B hip flexion 5/5  Time 8   Period Weeks   Status New   PT LONG TERM GOAL #2   Title Pt will decrease LBP by 25%   Time 8   Period Weeks   Status New   PT LONG TERM GOAL #3   Title Pt will be able to perform household activities with 50% decrease in pain.     Time 8   Period Weeks   Status New   PT LONG TERM GOAL #4   Title Pt               Plan - 2014/12/15 1446    Clinical Impression Statement Pt presents to outpatient ortho with chronic LBP and thoracic pain which started months ago.  Past hx of B TKA, L3/4 pedicle fusion.  Pt has spinal injection scheduled for 11/21/14.   Pt has limited ROM in trunk due to pain on R paraspinals, limitations in supine SLR,   Pt has pain washing dishes, unloading dishwasher and standing/sitting for longer periods of time.  Pt will benefit from core strengthening, posture education, and increase strength.     Pt will benefit from skilled therapeutic intervention in order to improve on the following deficits Decreased activity tolerance;Decreased endurance;Decreased mobility;Decreased range of motion;Decreased strength;Difficulty walking;Hypomobility;Increased muscle spasms;Pain   Rehab Potential Good   Clinical Impairments Affecting Rehab Potential previous spinal fusion, B TKA   PT Frequency 2x / week   PT Duration 8 weeks   PT Treatment/Interventions ADLs/Self Care Home Management;Cryotherapy;Electrical Stimulation;Iontophoresis /ml Dexamethasone;Moist Heat;Ultrasound;Gait training;Stair training;Functional mobility training;Therapeutic activities;Therapeutic exercise;Neuromuscular re-education;Patient/family education;Balance training;Manual techniques;Passive range of motion    PT Next Visit Plan Start core strengthening, LE exercises, posture education, thoracic mobility/strengthening.    PT Home Exercise Plan posture education, williams flexion exercises   Consulted and Agree with Plan of Care Patient          G-Codes - December 15, 2014 1532    Functional Assessment Tool Used foto   Functional Limitation Other PT primary   Other PT Primary Current Status (Z6109) At least 40 percent but less than 60 percent impaired, limited or restricted   Other PT Primary Goal Status (U0454) At least 40 percent but less than 60 percent impaired, limited or restricted       Problem List Patient Active Problem List   Diagnosis Date Noted  . Type II or unspecified type diabetes mellitus without mention of complication, uncontrolled 11/08/2013  . Acute blood loss anemia 11/08/2013  . Hx of total knee arthroplasty 06/14/2013  . Hyperlipidemia 06/01/2013  . Hypertension 06/01/2013  . Depression 06/01/2013  . GERD (gastroesophageal reflux disease) 06/01/2013  . Diabetes mellitus 06/01/2013  . Postoperative anemia due to acute blood loss 05/29/2013  . Hyponatremia 05/29/2013  . OA (osteoarthritis) of knee 05/28/2013    Jearld Lesch., PT Dec 15, 2014, 3:35 PM  [facility] [address] [place], [place], [address] Phone: [phone]   Fax:  [phone]

## 2014-11-21 ENCOUNTER — Ambulatory Visit: Payer: Medicare Other | Admitting: Physical Therapy

## 2014-11-25 ENCOUNTER — Ambulatory Visit: Payer: Medicare Other | Admitting: Physical Therapy

## 2014-11-28 ENCOUNTER — Ambulatory Visit: Payer: Medicare Other | Admitting: Physical Therapy

## 2015-06-11 ENCOUNTER — Other Ambulatory Visit (HOSPITAL_COMMUNITY): Payer: Self-pay | Admitting: Respiratory Therapy

## 2015-06-11 DIAGNOSIS — R0602 Shortness of breath: Secondary | ICD-10-CM

## 2015-06-18 ENCOUNTER — Ambulatory Visit (HOSPITAL_COMMUNITY)
Admission: RE | Admit: 2015-06-18 | Discharge: 2015-06-18 | Disposition: A | Discharge status: Home / Self Care | Payer: Medicare Other | Source: Ambulatory Visit | Attending: Internal Medicine | Admitting: Internal Medicine

## 2015-06-18 DIAGNOSIS — R0602 Shortness of breath: Secondary | ICD-10-CM | POA: Insufficient documentation

## 2015-06-20 LAB — PULMONARY FUNCTION TEST
DL/VA % pred: 88 %
DL/VA: 4.23 ml/min/mmHg/L
DLCO unc % pred: 68 %
DLCO unc: 16.69 ml/min/mmHg
FEF 25-75 Pre: 2.11 L/sec
FEF2575-%Pred-Pre: 114 %
FEV1-%Pred-Pre: 85 %
FEV1-Pre: 1.9 L
FEV1FVC-%Pred-Pre: 107 %
FEV6-%Pred-Pre: 82 %
FEV6-Pre: 2.32 L
FEV6FVC-%Pred-Pre: 104 %
FVC-%Pred-Pre: 78 %
FVC-Pre: 2.33 L
Pre FEV1/FVC ratio: 82 %
Pre FEV6/FVC Ratio: 100 %
RV % pred: 78 %
RV: 1.73 L
TLC % pred: 83 %
TLC: 4.24 L

## 2016-08-26 ENCOUNTER — Encounter: Payer: Self-pay | Admitting: Physical Therapy

## 2016-08-26 ENCOUNTER — Ambulatory Visit: Payer: Medicare Other | Attending: Orthopedic Surgery | Admitting: Physical Therapy

## 2016-08-26 DIAGNOSIS — M25562 Pain in left knee: Secondary | ICD-10-CM | POA: Insufficient documentation

## 2016-08-26 DIAGNOSIS — M25561 Pain in right knee: Secondary | ICD-10-CM | POA: Insufficient documentation

## 2016-08-26 DIAGNOSIS — R262 Difficulty in walking, not elsewhere classified: Secondary | ICD-10-CM

## 2016-08-26 DIAGNOSIS — M25661 Stiffness of right knee, not elsewhere classified: Secondary | ICD-10-CM | POA: Insufficient documentation

## 2016-08-26 DIAGNOSIS — M25662 Stiffness of left knee, not elsewhere classified: Secondary | ICD-10-CM | POA: Insufficient documentation

## 2016-08-26 NOTE — Therapy (Signed)
[facility] [address] [place], [place], [address] Phone: [phone]   Fax:  [phone]  Physical Therapy Evaluation  Patient Details  Name: Vanessa HanlonLinda R Frank MRN: [id number] Date of Birth: Sep 16, 1945 Referring Provider: Lequita HaltAluisio  Encounter Date: 08/26/2016      PT End of Session - 08/26/16 1432    Visit Number 1   Date for PT Re-Evaluation 10/26/16   PT Start Time 1400   PT Stop Time 1450   PT Time Calculation (min) 50 min   Activity Tolerance Patient tolerated treatment well   Behavior During Therapy [facility] for tasks assessed/performed      Past Medical History:  Diagnosis Date  . Anxiety   . Arthritis   . Diabetes mellitus ORAL MEDS ONLY  . Dysrhythmia IRREGULAR  RATE IN HER 20'S  . Fibromyalgia   . GERD (gastroesophageal reflux disease)   . Headache(784.0)   . Hypertension   . Neuropathy    TOES  . Palpitations    OCCASIONAL  . Shortness of breath WITH EXERTION  . Stress incontinence     Past Surgical History:  Procedure Laterality Date  . ABDOMINAL HYSTERECTOMY  80'S   Partial   . ANTERIOR LAT LUMBAR FUSION  03/26/2011   Procedure: ANTERIOR LATERAL LUMBAR FUSION 1 LEVEL;  Surgeon: [name];  [facility]: [facility];  Service: Neurosurgery;  Laterality: Left;  Left Lumbar three-four anterolateral retroperitoneal interbody fusion   . ARTHROSCOPY KNEE W/ DRILLING  09 LEFT   2012 RIGHT  . BARTHOLIN GLAND CYST EXCISION  1983  . CHOLECYSTECTOMY  1989  . CYSTOSCOPY  80'S  . FOOT SURGERY Right   . SPINAL FUSION  2012   Lower Back  . TOTAL KNEE ARTHROPLASTY Left 05/28/2013   Procedure: LEFT TOTAL KNEE ARTHROPLASTY;  Surgeon: [name], MD;  Location: [facility];  Service: Orthopedics;  Laterality: Left;  . TOTAL KNEE ARTHROPLASTY Right 10/29/2013   Procedure: RIGHT TOTAL KNEE ARTHROPLASTY;  Surgeon: Loanne DrillingFrank Aluisio V, MD;  Location: [facility];  Service: Orthopedics;  Laterality: Right;    There were no  vitals filed for this visit.       Subjective Assessment - 08/26/16 1406    Subjective Patient reports that is having increased bilateral knee pain, and feeling "loose" in the knees for about 2 months.  She reports that the past two months she has had more difficulty.   Pertinent History history of both knees replaced in 2015   Limitations Standing;Walking   Patient Stated Goals walking   Currently in Pain? Yes   Pain Score 2    Pain Location Knee   Pain Orientation Right;Left   Pain Descriptors / Indicators Aching;Sore;Tender   Pain Type Acute pain   Pain Onset More than a month ago   Pain Frequency Constant   Aggravating Factors  at night has pain, walking after sitting any length of time pain can be up to 8/10   Pain Relieving Factors rest pain can be 0/10   Effect of Pain on Daily Activities difficulty standing and walking            [facility] PT Assessment - 08/26/16 0001      Assessment   Medical Diagnosis bilateral knee pain   Referring Provider Aluisio   Onset Date/Surgical Date 07/27/16   Prior Therapy 3 years ago after TKR's     Precautions   Precautions None     Balance Screen  Has the patient fallen in the past 6 months Yes   How many times? 1   Has the patient had a decrease in activity level because of a fear of falling?  No   Is the patient reluctant to leave their home because of a fear of falling?  No     Home Environment   Additional Comments has some stairs     Prior Function   Level of Independence Independent   Vocation Retired   Leisure no exercise     ROM / Strength   AROM / PROM / Strength AROM;Strength     AROM   AROM Assessment Site Knee   Right/Left Knee Right;Left   Right Knee Extension 6   Right Knee Flexion 88   Left Knee Extension 10   Left Knee Flexion 90     PROM   PROM Assessment Site Knee   Right/Left Knee Right;Left   Right Knee Extension 0   Right Knee Flexion 103   Left Knee Extension 0   Left Knee Flexion 103      Strength   Overall Strength Comments 4/5 left knee and 4-/5 right knee     Palpation   Palpation comment some crepitus with the knees with going from flexion to extension, she reported to me that the prosthesis was loose but I do not think that is correct, it seems like she has lost some mm tone     Ambulation/Gait   Gait Comments no device, slow shffuling steps, antalgic on the right, able to gup up and down stairs step over step but with pain on the right and much weaker on the right                   [facility] Adult PT Treatment/Exercise - 08/26/16 0001      Modalities   Modalities Cryotherapy;Electrical Stimulation     Cryotherapy   Number Minutes Cryotherapy 15 Minutes   Cryotherapy Location Knee   Type of Cryotherapy Ice pack     Electrical Stimulation   Electrical Stimulation Location right knee   Electrical Stimulation Action IFC   Electrical Stimulation Parameters elevated   Electrical Stimulation Goals Pain                PT Education - 08/26/16 1432    Education provided Yes   Education Details quad strength   Person(s) Educated Patient   Methods Explanation;Demonstration;Handout   Comprehension Verbalized understanding;Returned demonstration;Verbal cues required;Tactile cues required          PT Short Term Goals - 08/26/16 1437      PT SHORT TERM GOAL #1   Title Pt will be ind with HEP   Time 2   Period Weeks   Status New           PT Long Term Goals - 08/26/16 1437      PT LONG TERM GOAL #1   Title Pt will increase strength B knees to 4+/5   Time 8   Period Weeks   Status New     PT LONG TERM GOAL #2   Title Pt will decrease pain by 25%   Time 8   Period Weeks   Status New     PT LONG TERM GOAL #3   Title Pt will be able to perform household activities with 50% decrease in pain.     Time 8   Period Weeks   Status New     PT LONG  TERM GOAL #4   Title go up and down stairs step over step without compensation and  limp on the right   Time 8   Period Weeks   Status New               Plan - 09-21-16 1433    Clinical Impression Statement Patient reports TKR's oin 2015.  She has been doing well until a few months ago she started having knee pain and felt popping, she is worried that the prosthesis is loose.  Her ROM is 12-90 degrees flexion with 4-/5 strength on the right, she limps on the right, it seems that she has just lost some mm tone and she is now having to overcompensate and having some hip pain   Rehab Potential Good   PT Frequency 2x / week   PT Duration 8 weeks   PT Treatment/Interventions ADLs/Self Care Home Management;Cryotherapy;Electrical Stimulation;Gait training;Stair training;Functional mobility training;Patient/family education;Therapeutic exercise;Neuromuscular re-education;Therapeutic activities;Manual techniques;Vasopneumatic Device   PT Next Visit Plan add gym exercises   Consulted and Agree with Plan of Care Patient      Patient will benefit from skilled therapeutic intervention in order to improve the following deficits and impairments:  Abnormal gait, Decreased activity tolerance, Decreased balance, Decreased mobility, Decreased strength, Increased edema, Pain, Decreased range of motion, Difficulty walking  Visit Diagnosis: Acute pain of right knee - Plan: PT plan of care cert/re-cert  Acute pain of left knee - Plan: PT plan of care cert/re-cert  Difficulty in walking, not elsewhere classified - Plan: PT plan of care cert/re-cert  Stiffness of left knee, not elsewhere classified - Plan: PT plan of care cert/re-cert  Stiffness of right knee, not elsewhere classified - Plan: PT plan of care cert/re-cert      G-Codes - 2016/09/21 1439    Functional Assessment Tool Used (Outpatient Only) foto 66% limitation   Functional Limitation Mobility: Walking and moving around   Mobility: Walking and Moving Around Current Status (O1308) At least 60 percent but less than 80  percent impaired, limited or restricted   Mobility: Walking and Moving Around Goal Status [phone]) At least 40 percent but less than 60 percent impaired, limited or restricted       Problem List Patient Active Problem List   Diagnosis Date Noted  . Type II or unspecified type diabetes mellitus without mention of complication, uncontrolled 11/08/2013  . Acute blood loss anemia 11/08/2013  . Hx of total knee arthroplasty 06/14/2013  . Hyperlipidemia 06/01/2013  . Hypertension 06/01/2013  . Depression 06/01/2013  . GERD (gastroesophageal reflux disease) 06/01/2013  . Diabetes mellitus (HCC) 06/01/2013  . Postoperative anemia due to acute blood loss 05/29/2013  . Hyponatremia 05/29/2013  . OA (osteoarthritis) of knee 05/28/2013    Jearld Lesch., PT 09/21/16, 3:19 PM  [facility] [address] [place], [place], [address] Phone: [phone]   Fax:  [phone]  Name: ALIVIANA BURDELL MRN: [id number] Date of Birth: [birth year]

## 2016-08-31 ENCOUNTER — Ambulatory Visit: Payer: Medicare Other | Admitting: Rehabilitation

## 2016-08-31 DIAGNOSIS — M25561 Pain in right knee: Secondary | ICD-10-CM

## 2016-08-31 DIAGNOSIS — M25661 Stiffness of right knee, not elsewhere classified: Secondary | ICD-10-CM

## 2016-08-31 DIAGNOSIS — M25662 Stiffness of left knee, not elsewhere classified: Secondary | ICD-10-CM

## 2016-08-31 DIAGNOSIS — R262 Difficulty in walking, not elsewhere classified: Secondary | ICD-10-CM

## 2016-08-31 DIAGNOSIS — M25562 Pain in left knee: Secondary | ICD-10-CM

## 2016-08-31 NOTE — Therapy (Signed)
[facility] [address] [place], [place], [address] Phone: [phone]   Fax:  [phone]  Physical Therapy Treatment  Patient Details  Name: Vanessa HanlonLinda R Frank MRN: [id number] Date of Birth: Aug 13, 1945 Referring Provider: Lequita HaltAluisio  Encounter Date: 08/31/2016      PT End of Session - 08/31/16 1501    Visit Number 2   Date for PT Re-Evaluation 10/26/16   PT Start Time 1400   PT Stop Time 1500   PT Time Calculation (min) 60 min   Activity Tolerance Patient tolerated treatment well      Past Medical History:  Diagnosis Date  . Anxiety   . Arthritis   . Diabetes mellitus ORAL MEDS ONLY  . Dysrhythmia IRREGULAR  RATE IN HER 20'S  . Fibromyalgia   . GERD (gastroesophageal reflux disease)   . Headache(784.0)   . Hypertension   . Neuropathy    TOES  . Palpitations    OCCASIONAL  . Shortness of breath WITH EXERTION  . Stress incontinence     Past Surgical History:  Procedure Laterality Date  . ABDOMINAL HYSTERECTOMY  80'S   Partial   . ANTERIOR LAT LUMBAR FUSION  03/26/2011   Procedure: ANTERIOR LATERAL LUMBAR FUSION 1 LEVEL;  Surgeon: [name];  Location: [facility];  Service: Neurosurgery;  Laterality: Left;  Left Lumbar three-four anterolateral retroperitoneal interbody fusion   . ARTHROSCOPY KNEE W/ DRILLING  09 LEFT   2012 RIGHT  . BARTHOLIN GLAND CYST EXCISION  1983  . CHOLECYSTECTOMY  1989  . CYSTOSCOPY  80'S  . FOOT SURGERY Right   . SPINAL FUSION  2012   Lower Back  . TOTAL KNEE ARTHROPLASTY Left 05/28/2013   Procedure: LEFT TOTAL KNEE ARTHROPLASTY;  Surgeon: [name], MD;  Location: [facility];  Service: Orthopedics;  Laterality: Left;  . TOTAL KNEE ARTHROPLASTY Right 10/29/2013   Procedure: RIGHT TOTAL KNEE ARTHROPLASTY;  Surgeon: Loanne DrillingFrank Aluisio V, MD;  Location: [facility];  Service: Orthopedics;  Laterality: Right;    There were no vitals filed for this visit.      Subjective Assessment -  08/31/16 1400    Subjective A little sore today.  Did some exercises which has made it a little sore.     Currently in Pain? Yes   Pain Score 4    Pain Location Knee   Pain Orientation Right;Left   Pain Descriptors / Indicators Aching                         [facility] Adult PT Treatment/Exercise - 08/31/16 0001      Exercises   Exercises Knee/Hip     Knee/Hip Exercises: Aerobic   Nustep level 5x406min     Knee/Hip Exercises: Machines for Strengthening   Cybex Knee Extension 10# x 15   Cybex Knee Flexion 10# x 15   Cybex Leg Press 10# 2x10 bil     Knee/Hip Exercises: Supine   Quad Sets 5 reps   Quad Sets Limitations bil   Bridges 15 reps   Straight Leg Raises 10 reps   Straight Leg Raises Limitations 2# bil   Knee Flexion 10 reps   Knee Flexion Limitations blue ball     Cryotherapy   Number Minutes Cryotherapy 15 Minutes   Cryotherapy Location Knee  bil   Type of Cryotherapy Ice pack     Electrical Stimulation   Electrical Stimulation Location right knee  Electrical Stimulation Action IFC   Electrical Stimulation Parameters elevated   Electrical Stimulation Goals Pain                  PT Short Term Goals - 08/26/16 1437      PT SHORT TERM GOAL #1   Title Pt will be ind with HEP   Time 2   Period Weeks   Status New           PT Long Term Goals - 08/26/16 1437      PT LONG TERM GOAL #1   Title Pt will increase strength B knees to 4+/5   Time 8   Period Weeks   Status New     PT LONG TERM GOAL #2   Title Pt will decrease pain by 25%   Time 8   Period Weeks   Status New     PT LONG TERM GOAL #3   Title Pt will be able to perform household activities with 50% decrease in pain.     Time 8   Period Weeks   Status New     PT LONG TERM GOAL #4   Title go up and down stairs step over step without compensation and limp on the right   Time 8   Period Weeks   Status New               Plan - 08/31/16 1501    Clinical  Impression Statement Pt tolerated all new TE well.  Able to participate in supine and machine exercises today without increased pain.     PT Next Visit Plan cont POC for LE strengthening      Patient will benefit from skilled therapeutic intervention in order to improve the following deficits and impairments:  Abnormal gait, Decreased activity tolerance, Decreased balance, Decreased mobility, Decreased strength, Increased edema, Pain, Decreased range of motion, Difficulty walking  Visit Diagnosis: Acute pain of right knee  Acute pain of left knee  Difficulty in walking, not elsewhere classified  Stiffness of left knee, not elsewhere classified  Stiffness of right knee, not elsewhere classified     Problem List Patient Active Problem List   Diagnosis Date Noted  . Type II or unspecified type diabetes mellitus without mention of complication, uncontrolled 11/08/2013  . Acute blood loss anemia 11/08/2013  . Hx of total knee arthroplasty 06/14/2013  . Hyperlipidemia 06/01/2013  . Hypertension 06/01/2013  . Depression 06/01/2013  . GERD (gastroesophageal reflux disease) 06/01/2013  . Diabetes mellitus (HCC) 06/01/2013  . Postoperative anemia due to acute blood loss 05/29/2013  . Hyponatremia 05/29/2013  . OA (osteoarthritis) of knee 05/28/2013    Idamae Lusher, DPT, CMP 08/31/2016, 3:05 PM  [facility] [address] [place], [place], [address] Phone: [phone]   Fax:  [phone]  Name: Vanessa Frank MRN: [id number] Date of Birth: 08-09-1945

## 2016-09-02 ENCOUNTER — Ambulatory Visit: Payer: Medicare Other | Admitting: Rehabilitation

## 2016-09-02 DIAGNOSIS — R262 Difficulty in walking, not elsewhere classified: Secondary | ICD-10-CM

## 2016-09-02 DIAGNOSIS — M25661 Stiffness of right knee, not elsewhere classified: Secondary | ICD-10-CM

## 2016-09-02 DIAGNOSIS — M25562 Pain in left knee: Secondary | ICD-10-CM

## 2016-09-02 DIAGNOSIS — M25561 Pain in right knee: Secondary | ICD-10-CM

## 2016-09-02 DIAGNOSIS — M25662 Stiffness of left knee, not elsewhere classified: Secondary | ICD-10-CM

## 2016-09-02 NOTE — Therapy (Signed)
[facility] [address] [place], [place], [address] Phone: [phone]   Fax:  [phone]  Physical Therapy Treatment  Patient Details  Name: Nolberto HanlonLinda R Singleton MRN: [id number] Date of Birth: 08/30/1945 Referring Provider: Lequita HaltAluisio  Encounter Date: 09/02/2016      PT End of Session - 09/02/16 1418    Visit Number 3   Date for PT Re-Evaluation 10/26/16   PT Start Time 1400   PT Stop Time 1449   PT Time Calculation (min) 49 min   Activity Tolerance Patient tolerated treatment well      Past Medical History:  Diagnosis Date  . Anxiety   . Arthritis   . Diabetes mellitus ORAL MEDS ONLY  . Dysrhythmia IRREGULAR  RATE IN HER 20'S  . Fibromyalgia   . GERD (gastroesophageal reflux disease)   . Headache(784.0)   . Hypertension   . Neuropathy    TOES  . Palpitations    OCCASIONAL  . Shortness of breath WITH EXERTION  . Stress incontinence     Past Surgical History:  Procedure Laterality Date  . ABDOMINAL HYSTERECTOMY  80'S   Partial   . ANTERIOR LAT LUMBAR FUSION  03/26/2011   Procedure: ANTERIOR LATERAL LUMBAR FUSION 1 LEVEL;  Surgeon: [name];  [facility]: [facility];  Service: Neurosurgery;  Laterality: Left;  Left Lumbar three-four anterolateral retroperitoneal interbody fusion   . ARTHROSCOPY KNEE W/ DRILLING  09 LEFT   2012 RIGHT  . BARTHOLIN GLAND CYST EXCISION  1983  . CHOLECYSTECTOMY  1989  . CYSTOSCOPY  80'S  . FOOT SURGERY Right   . SPINAL FUSION  2012   Lower Back  . TOTAL KNEE ARTHROPLASTY Left 05/28/2013   Procedure: LEFT TOTAL KNEE ARTHROPLASTY;  Surgeon: [name], MD;  Location: [facility];  Service: Orthopedics;  Laterality: Left;  . TOTAL KNEE ARTHROPLASTY Right 10/29/2013   Procedure: RIGHT TOTAL KNEE ARTHROPLASTY;  Surgeon: Loanne DrillingFrank Aluisio V, MD;  Location: [facility];  Service: Orthopedics;  Laterality: Right;    There were no vitals filed for this visit.      Subjective Assessment -  09/02/16 1402    Subjective Hips are a bit sore today from the last visit   Currently in Pain? Yes   Pain Score 4    Pain Orientation Right;Left   Pain Descriptors / Indicators Aching   Pain Type Acute pain                         [facility] Adult PT Treatment/Exercise - 09/02/16 0001      Knee/Hip Exercises: Stretches   Passive Hamstring Stretch Both;2 reps;20 seconds     Knee/Hip Exercises: Aerobic   Nustep level 5x196min     Knee/Hip Exercises: Machines for Strengthening   Cybex Knee Extension 10# x 15   Cybex Knee Flexion 10# x 15   Cybex Leg Press 10# 2x10 bil     Knee/Hip Exercises: Supine   Quad Sets 5 reps   Quad Sets Limitations bil   Bridges 15 reps   Straight Leg Raises 10 reps   Straight Leg Raises Limitations 2# bil   Knee Flexion 10 reps   Knee Flexion Limitations blue ball     Knee/Hip Exercises: Sidelying   Hip ABduction 10 reps   Hip ABduction Limitations 2#     Modalities   Modalities Cryotherapy;Electrical Stimulation     Cryotherapy   Number Minutes Cryotherapy  15 Minutes   Cryotherapy Location Knee  bil   Type of Cryotherapy Ice pack     Electrical Stimulation   Electrical Stimulation Location right knee   Electrical Stimulation Action IFC   Electrical Stimulation Parameters elevated   Electrical Stimulation Goals Pain     Manual Therapy   Manual therapy comments Hamstring stretch bil per stretch section                  PT Short Term Goals - 08/26/16 1437      PT SHORT TERM GOAL #1   Title Pt will be ind with HEP   Time 2   Period Weeks   Status New           PT Long Term Goals - 08/26/16 1437      PT LONG TERM GOAL #1   Title Pt will increase strength B knees to 4+/5   Time 8   Period Weeks   Status New     PT LONG TERM GOAL #2   Title Pt will decrease pain by 25%   Time 8   Period Weeks   Status New     PT LONG TERM GOAL #3   Title Pt will be able to perform household activities with 50%  decrease in pain.     Time 8   Period Weeks   Status New     PT LONG TERM GOAL #4   Title go up and down stairs step over step without compensation and limp on the right   Time 8   Period Weeks   Status New               Plan - 09/02/16 1418    Clinical Impression Statement Added only sidelying hip abduction today with good performance.  Pt reporting intensity of sessions to be good.  L LE with more difficulty straightening compared to R.     PT Next Visit Plan cont POC for LE strengthening      Patient will benefit from skilled therapeutic intervention in order to improve the following deficits and impairments:  Abnormal gait, Decreased activity tolerance, Decreased balance, Decreased mobility, Decreased strength, Increased edema, Pain, Decreased range of motion, Difficulty walking  Visit Diagnosis: Acute pain of right knee  Acute pain of left knee  Difficulty in walking, not elsewhere classified  Stiffness of left knee, not elsewhere classified  Stiffness of right knee, not elsewhere classified     Problem List Patient Active Problem List   Diagnosis Date Noted  . Type II or unspecified type diabetes mellitus without mention of complication, uncontrolled 11/08/2013  . Acute blood loss anemia 11/08/2013  . Hx of total knee arthroplasty 06/14/2013  . Hyperlipidemia 06/01/2013  . Hypertension 06/01/2013  . Depression 06/01/2013  . GERD (gastroesophageal reflux disease) 06/01/2013  . Diabetes mellitus (HCC) 06/01/2013  . Postoperative anemia due to acute blood loss 05/29/2013  . Hyponatremia 05/29/2013  . OA (osteoarthritis) of knee 05/28/2013    Idamae Lusher, DPT, CMP 09/02/2016, 2:37 PM  [facility] [place] [facility] [address] [place], [place], [address] Phone: [phone]   Fax:  [phone]  Name: PATRICI MINNIS MRN: [id number] Date of Birth: 19-Nov-1945

## 2016-09-07 ENCOUNTER — Ambulatory Visit: Payer: Medicare Other | Admitting: Rehabilitation

## 2016-09-07 ENCOUNTER — Encounter: Payer: Self-pay | Admitting: Rehabilitation

## 2016-09-07 DIAGNOSIS — M25561 Pain in right knee: Secondary | ICD-10-CM

## 2016-09-07 DIAGNOSIS — R262 Difficulty in walking, not elsewhere classified: Secondary | ICD-10-CM

## 2016-09-07 DIAGNOSIS — M25562 Pain in left knee: Secondary | ICD-10-CM

## 2016-09-07 NOTE — Therapy (Signed)
[facility] [place] [facility] [address] [place], [place], [address] Phone: [phone]   Fax:  [phone]  Physical Therapy Treatment  Patient Details  Name: Vanessa Frank MRN: [id number] Date of Birth: 1945/09/27 Referring Provider: Lequita Halt  Encounter Date: 09/07/2016      PT End of Session - 09/07/16 1606    Visit Number 4   Date for PT Re-Evaluation 10/26/16   PT Start Time 1532   PT Stop Time 1626   PT Time Calculation (min) 54 min   Activity Tolerance Patient tolerated treatment well      Past Medical History:  Diagnosis Date  . Anxiety   . Arthritis   . Diabetes mellitus ORAL MEDS ONLY  . Dysrhythmia IRREGULAR  RATE IN HER 20'S  . Fibromyalgia   . GERD (gastroesophageal reflux disease)   . Headache(784.0)   . Hypertension   . Neuropathy    TOES  . Palpitations    OCCASIONAL  . Shortness of breath WITH EXERTION  . Stress incontinence     Past Surgical History:  Procedure Laterality Date  . ABDOMINAL HYSTERECTOMY  80'S   Partial   . ANTERIOR LAT LUMBAR FUSION  03/26/2011   Procedure: ANTERIOR LATERAL LUMBAR FUSION 1 LEVEL;  Surgeon: Tia Alert;  [facility];  Service: Neurosurgery;  Laterality: Left;  Left Lumbar three-four anterolateral retroperitoneal interbody fusion   . ARTHROSCOPY KNEE W/ DRILLING  09 LEFT   2012 RIGHT  . BARTHOLIN GLAND CYST EXCISION  1983  . CHOLECYSTECTOMY  1989  . CYSTOSCOPY  80'S  . FOOT SURGERY Right   . SPINAL FUSION  2012   Lower Back  . TOTAL KNEE ARTHROPLASTY Left 05/28/2013   Procedure: LEFT TOTAL KNEE ARTHROPLASTY;  Surgeon: Loanne Drilling, MD;  Location: [facility];  Service: Orthopedics;  Laterality: Left;  . TOTAL KNEE ARTHROPLASTY Right 10/29/2013   Procedure: RIGHT TOTAL KNEE ARTHROPLASTY;  Surgeon: Loanne Drilling, MD;  [facility]: [facility];  Service: Orthopedics;  Laterality: Right;    There were no vitals filed for this visit.      Subjective Assessment -  09/07/16 1535    Subjective Feeling good today.  Sore after last visit but not too much.     Currently in Pain? Yes   Pain Score 3    Pain Location Knee   Pain Orientation Right;Left   Pain Descriptors / Indicators Aching   Pain Type Acute pain                         [facility] Adult PT Treatment/Exercise - 09/07/16 0001      Knee/Hip Exercises: Aerobic   Nustep level 5x69min     Knee/Hip Exercises: Machines for Strengthening   Cybex Knee Extension 10# x 15   Cybex Knee Flexion 10# x 15   Cybex Leg Press 10# 2x15     Knee/Hip Exercises: Standing   Walking with Sports Cord side steps bil, monster walks 2x10 each   Other Standing Knee Exercises in corner SL stance and tandem stance work bil      Knee/Hip Exercises: Seated   Sit to [employer] 10 reps     Cryotherapy   Number Minutes Cryotherapy 15 Minutes   Cryotherapy Location Knee   Type of Cryotherapy Ice pack     Electrical Stimulation   Electrical Stimulation Location right knee   Electrical Stimulation Action IFC   Electrical Stimulation Parameters  elevated   Electrical Stimulation Goals Pain                  PT Short Term Goals - 08/26/16 1437      PT SHORT TERM GOAL #1   Title Pt will be ind with HEP   Time 2   Period Weeks   Status New           PT Long Term Goals - 08/26/16 1437      PT LONG TERM GOAL #1   Title Pt will increase strength B knees to 4+/5   Time 8   Period Weeks   Status New     PT LONG TERM GOAL #2   Title Pt will decrease pain by 25%   Time 8   Period Weeks   Status New     PT LONG TERM GOAL #3   Title Pt will be able to perform household activities with 50% decrease in pain.     Time 8   Period Weeks   Status New     PT LONG TERM GOAL #4   Title go up and down stairs step over step without compensation and limp on the right   Time 8   Period Weeks   Status New               Plan - 09/07/16 1607    Clinical Impression Statement continues  to tolerate all well.  added some balance work today with pt surprised on difficulty of SL stance.     PT Frequency 2x / week   PT Next Visit Plan cont POC for LE strengthening      Patient will benefit from skilled therapeutic intervention in order to improve the following deficits and impairments:  Abnormal gait, Decreased activity tolerance, Decreased balance, Decreased mobility, Decreased strength, Increased edema, Pain, Decreased range of motion, Difficulty walking  Visit Diagnosis: Acute pain of right knee  Acute pain of left knee  Difficulty in walking, not elsewhere classified     Problem List Patient Active Problem List   Diagnosis Date Noted  . Type II or unspecified type diabetes mellitus without mention of complication, uncontrolled 11/08/2013  . Acute blood loss anemia 11/08/2013  . Hx of total knee arthroplasty 06/14/2013  . Hyperlipidemia 06/01/2013  . Hypertension 06/01/2013  . Depression 06/01/2013  . GERD (gastroesophageal reflux disease) 06/01/2013  . Diabetes mellitus (HCC) 06/01/2013  . Postoperative anemia due to acute blood loss 05/29/2013  . Hyponatremia 05/29/2013  . OA (osteoarthritis) of knee 05/28/2013    Idamae Lusherevis, Kara R, DPT, CMP 09/07/2016, 4:15 PM  [facility] [address] [place], [place], [address] Phone: [phone]   Fax:  [phone]  Name: Vanessa HanlonLinda R Frank MRN: [id number] Date of Birth: July 10, 1945

## 2016-09-09 ENCOUNTER — Ambulatory Visit: Payer: Medicare Other | Admitting: Physical Therapy

## 2016-09-09 ENCOUNTER — Encounter: Payer: Self-pay | Admitting: Physical Therapy

## 2016-09-09 DIAGNOSIS — M25561 Pain in right knee: Secondary | ICD-10-CM

## 2016-09-09 DIAGNOSIS — R262 Difficulty in walking, not elsewhere classified: Secondary | ICD-10-CM

## 2016-09-09 DIAGNOSIS — M25562 Pain in left knee: Secondary | ICD-10-CM

## 2016-09-09 NOTE — Therapy (Signed)
[facility] [place] [facility] [address] [place], [place], [address] Phone: [phone]   Fax:  [phone]  Physical Therapy Treatment  Patient Details  Name: Vanessa Frank MRN: [id number] Date of Birth: 1945/08/27 Referring Provider: Lequita Halt  Encounter Date: 09/09/2016      PT End of Session - 09/09/16 1526    Visit Number 5   Date for PT Re-Evaluation 10/26/16   PT Start Time 1443   PT Stop Time 1541   PT Time Calculation (min) 58 min   Activity Tolerance Patient tolerated treatment well   Behavior During Therapy [facility] for tasks assessed/performed      Past Medical History:  Diagnosis Date  . Anxiety   . Arthritis   . Diabetes mellitus ORAL MEDS ONLY  . Dysrhythmia IRREGULAR  RATE IN HER 20'S  . Fibromyalgia   . GERD (gastroesophageal reflux disease)   . Headache(784.0)   . Hypertension   . Neuropathy    TOES  . Palpitations    OCCASIONAL  . Shortness of breath WITH EXERTION  . Stress incontinence     Past Surgical History:  Procedure Laterality Date  . ABDOMINAL HYSTERECTOMY  80'S   Partial   . ANTERIOR LAT LUMBAR FUSION  03/26/2011   Procedure: ANTERIOR LATERAL LUMBAR FUSION 1 LEVEL;  Surgeon: Tia Alert;  [facility]: [facility];  Service: Neurosurgery;  Laterality: Left;  Left Lumbar three-four anterolateral retroperitoneal interbody fusion   . ARTHROSCOPY KNEE W/ DRILLING  09 LEFT   2012 RIGHT  . BARTHOLIN GLAND CYST EXCISION  1983  . CHOLECYSTECTOMY  1989  . CYSTOSCOPY  80'S  . FOOT SURGERY Right   . SPINAL FUSION  2012   Lower Back  . TOTAL KNEE ARTHROPLASTY Left 05/28/2013   Procedure: LEFT TOTAL KNEE ARTHROPLASTY;  Surgeon: Loanne Drilling, MD;  Location: [facility];  Service: Orthopedics;  Laterality: Left;  . TOTAL KNEE ARTHROPLASTY Right 10/29/2013   Procedure: RIGHT TOTAL KNEE ARTHROPLASTY;  Surgeon: Loanne Drilling, MD;  [facility];  Service: Orthopedics;  Laterality: Right;    There were no  vitals filed for this visit.      Subjective Assessment - 09/09/16 1448    Subjective My hips are still sore, I do feel like my legs are getting stronger   Currently in Pain? Yes   Pain Score 3    Pain Location Knee   Pain Orientation Right;Left   Pain Descriptors / Indicators Sore   Aggravating Factors  exercises make me sore                         [facility] Adult PT Treatment/Exercise - 09/09/16 0001      High Level Balance   High Level Balance Activities Tandem walking   High Level Balance Comments resisted gait fwd and side stepping     Knee/Hip Exercises: Stretches   Passive Hamstring Stretch Both;2 reps;20 seconds   Piriformis Stretch Both;3 reps;20 seconds     Knee/Hip Exercises: Aerobic   Nustep level 5x79min     Knee/Hip Exercises: Machines for Strengthening   Cybex Knee Extension 10# x 15   Cybex Knee Flexion 20# 2x15   Cybex Leg Press 20# 2x15     Knee/Hip Exercises: Supine   Other Supine Knee/Hip Exercises feet on ball K2C, trunk rotations and bridges, ball abdominal isometrics     Knee/Hip Exercises: Sidelying   Hip ABduction 10 reps  Hip ABduction Limitations 2#     Cryotherapy   Number Minutes Cryotherapy 15 Minutes   Cryotherapy Location Knee   Type of Cryotherapy Ice pack     Electrical Stimulation   Electrical Stimulation Location right knee   Electrical Stimulation Action IFC   Electrical Stimulation Parameters elevated   Electrical Stimulation Goals Pain                  PT Short Term Goals - 09/09/16 1528      PT SHORT TERM GOAL #1   Title Pt will be ind with HEP   Status Achieved           PT Long Term Goals - 08/26/16 1437      PT LONG TERM GOAL #1   Title Pt will increase strength B knees to 4+/5   Time 8   Period Weeks   Status New     PT LONG TERM GOAL #2   Title Pt will decrease pain by 25%   Time 8   Period Weeks   Status New     PT LONG TERM GOAL #3   Title Pt will be able to perform  household activities with 50% decrease in pain.     Time 8   Period Weeks   Status New     PT LONG TERM GOAL #4   Title go up and down stairs step over step without compensation and limp on the right   Time 8   Period Weeks   Status New               Plan - 09/09/16 1526    Clinical Impression Statement Reported that the piriformis stretch felt good and she thinks that will help the hip pain.  Some difficulty with balance activities   PT Next Visit Plan continue with flexibility, balance and strength   Consulted and Agree with Plan of Care Patient      Patient will benefit from skilled therapeutic intervention in order to improve the following deficits and impairments:  Abnormal gait, Decreased activity tolerance, Decreased balance, Decreased mobility, Decreased strength, Increased edema, Pain, Decreased range of motion, Difficulty walking  Visit Diagnosis: Acute pain of right knee  Acute pain of left knee  Difficulty in walking, not elsewhere classified     Problem List Patient Active Problem List   Diagnosis Date Noted  . Type II or unspecified type diabetes mellitus without mention of complication, uncontrolled 11/08/2013  . Acute blood loss anemia 11/08/2013  . Hx of total knee arthroplasty 06/14/2013  . Hyperlipidemia 06/01/2013  . Hypertension 06/01/2013  . Depression 06/01/2013  . GERD (gastroesophageal reflux disease) 06/01/2013  . Diabetes mellitus (HCC) 06/01/2013  . Postoperative anemia due to acute blood loss 05/29/2013  . Hyponatremia 05/29/2013  . OA (osteoarthritis) of knee 05/28/2013    Jearld LeschALBRIGHT,Boneta Standre W., PT 09/09/2016, 3:28 PM  [facility] [address] [place], [place], [address] Phone: [phone]   Fax:  [phone]  Name: Vanessa HanlonLinda R Hornstein MRN: [id number] Date of Birth: November 12, 1945

## 2016-09-16 ENCOUNTER — Encounter: Payer: Self-pay | Admitting: Physical Therapy

## 2016-09-16 ENCOUNTER — Ambulatory Visit: Payer: Medicare Other | Admitting: Physical Therapy

## 2016-09-16 DIAGNOSIS — M25561 Pain in right knee: Secondary | ICD-10-CM | POA: Diagnosis not present

## 2016-09-16 DIAGNOSIS — R262 Difficulty in walking, not elsewhere classified: Secondary | ICD-10-CM

## 2016-09-16 DIAGNOSIS — M25562 Pain in left knee: Secondary | ICD-10-CM

## 2016-09-16 NOTE — Therapy (Signed)
[facility] [address] [place], [place], [address] Phone: [phone]   Fax:  [phone]  Physical Therapy Treatment  Patient Details  Name: Vanessa HanlonLinda R Frank MRN: [id number] Date of Birth: 1945/11/13 Referring Provider: Lequita HaltAluisio  Encounter Date: 09/16/2016      PT End of Session - 09/16/16 1730    Visit Number 6   Date for PT Re-Evaluation 10/26/16   PT Start Time 1700   PT Stop Time 1749   PT Time Calculation (min) 49 min   Activity Tolerance Patient tolerated treatment well   Behavior During Therapy [facility] for tasks assessed/performed      Past Medical History:  Diagnosis Date  . Anxiety   . Arthritis   . Diabetes mellitus ORAL MEDS ONLY  . Dysrhythmia IRREGULAR  RATE IN HER 20'S  . Fibromyalgia   . GERD (gastroesophageal reflux disease)   . Headache(784.0)   . Hypertension   . Neuropathy    TOES  . Palpitations    OCCASIONAL  . Shortness of breath WITH EXERTION  . Stress incontinence     Past Surgical History:  Procedure Laterality Date  . ABDOMINAL HYSTERECTOMY  80'S   Partial   . ANTERIOR LAT LUMBAR FUSION  03/26/2011   Procedure: ANTERIOR LATERAL LUMBAR FUSION 1 LEVEL;  Surgeon: [name];  [facility]: [facility];  Service: Neurosurgery;  Laterality: Left;  Left Lumbar three-four anterolateral retroperitoneal interbody fusion   . ARTHROSCOPY KNEE W/ DRILLING  09 LEFT   2012 RIGHT  . BARTHOLIN GLAND CYST EXCISION  1983  . CHOLECYSTECTOMY  1989  . CYSTOSCOPY  80'S  . FOOT SURGERY Right   . SPINAL FUSION  2012   Lower Back  . TOTAL KNEE ARTHROPLASTY Left 05/28/2013   Procedure: LEFT TOTAL KNEE ARTHROPLASTY;  Surgeon: [name], MD;  [facility];  Service: Orthopedics;  Laterality: Left;  . TOTAL KNEE ARTHROPLASTY Right 10/29/2013   Procedure: RIGHT TOTAL KNEE ARTHROPLASTY;  Surgeon: Loanne DrillingFrank Aluisio V, MD;  Location: [facility];  Service: Orthopedics;  Laterality: Right;    There were no  vitals filed for this visit.      Subjective Assessment - 09/16/16 1703    Subjective Reports that she is having some increased pain in the left hip, she is unsure of why.     Currently in Pain? Yes   Pain Score 7    Pain Location Hip   Pain Orientation Left                         [facility] Adult PT Treatment/Exercise - 09/16/16 0001      Knee/Hip Exercises: Stretches   Passive Hamstring Stretch Both;2 reps;20 seconds   Piriformis Stretch Both;3 reps;20 seconds     Knee/Hip Exercises: Aerobic   Nustep level 5x36min     Knee/Hip Exercises: Machines for Strengthening   Cybex Knee Extension 10# 2x 15   Cybex Knee Flexion 20# 2x15   Cybex Leg Press 20# 2x15     Knee/Hip Exercises: Supine   Quad Sets Both;10 reps   Other Supine Knee/Hip Exercises feet on ball K2C, trunk rotations and bridges, ball abdominal isometrics     Cryotherapy   Number Minutes Cryotherapy 15 Minutes   Cryotherapy Location Knee   Type of Cryotherapy Ice pack     Electrical Stimulation   Electrical Stimulation Location right knee   Electrical Stimulation Action IFC   Electrical  Stimulation Parameters elevated   Electrical Stimulation Goals Pain                  PT Short Term Goals - 09/09/16 1528      PT SHORT TERM GOAL #1   Title Pt will be ind with HEP   Status Achieved           PT Long Term Goals - 08/26/16 1437      PT LONG TERM GOAL #1   Title Pt will increase strength B knees to 4+/5   Time 8   Period Weeks   Status New     PT LONG TERM GOAL #2   Title Pt will decrease pain by 25%   Time 8   Period Weeks   Status New     PT LONG TERM GOAL #3   Title Pt will be able to perform household activities with 50% decrease in pain.     Time 8   Period Weeks   Status New     PT LONG TERM GOAL #4   Title go up and down stairs step over step without compensation and limp on the right   Time 8   Period Weeks   Status New               Plan -  09/16/16 1734    Clinical Impression Statement Patient has some increased left hip pain, she is unsure of why, she does report a fall onto her left hip area about 3 months ago, but reports that she did not have any issues with it.  She is tight in the ITB and piriformis mms.  She really was non tender to the GT area   PT Next Visit Plan continue with flexibility, balance and strength   Consulted and Agree with Plan of Care Patient      Patient will benefit from skilled therapeutic intervention in order to improve the following deficits and impairments:  Abnormal gait, Decreased activity tolerance, Decreased balance, Decreased mobility, Decreased strength, Increased edema, Pain, Decreased range of motion, Difficulty walking  Visit Diagnosis: Acute pain of right knee  Acute pain of left knee  Difficulty in walking, not elsewhere classified     Problem List Patient Active Problem List   Diagnosis Date Noted  . Type II or unspecified type diabetes mellitus without mention of complication, uncontrolled 11/08/2013  . Acute blood loss anemia 11/08/2013  . Hx of total knee arthroplasty 06/14/2013  . Hyperlipidemia 06/01/2013  . Hypertension 06/01/2013  . Depression 06/01/2013  . GERD (gastroesophageal reflux disease) 06/01/2013  . Diabetes mellitus (HCC) 06/01/2013  . Postoperative anemia due to acute blood loss 05/29/2013  . Hyponatremia 05/29/2013  . OA (osteoarthritis) of knee 05/28/2013    Jearld Lesch., PT 09/16/2016, 5:38 PM  [facility] [place] [facility] [address] [place], [place], [address] Phone: [phone]   Fax:  [phone]  Name: Vanessa Frank MRN: [id number] Date of Birth: [birth year]

## 2016-09-21 ENCOUNTER — Encounter: Payer: Self-pay | Admitting: Physical Therapy

## 2016-09-21 ENCOUNTER — Ambulatory Visit: Payer: Medicare Other | Attending: Orthopedic Surgery | Admitting: Physical Therapy

## 2016-09-21 DIAGNOSIS — R262 Difficulty in walking, not elsewhere classified: Secondary | ICD-10-CM

## 2016-09-21 DIAGNOSIS — M25562 Pain in left knee: Secondary | ICD-10-CM | POA: Insufficient documentation

## 2016-09-21 DIAGNOSIS — M25561 Pain in right knee: Secondary | ICD-10-CM | POA: Diagnosis present

## 2016-09-21 DIAGNOSIS — M25662 Stiffness of left knee, not elsewhere classified: Secondary | ICD-10-CM | POA: Diagnosis present

## 2016-09-21 NOTE — Therapy (Signed)
[facility] [address] [place], [place], [address] Phone: [phone]   Fax:  [phone]  Physical Therapy Treatment  Patient Details  Name: Vanessa HanlonLinda R Frank MRN: [id number] Date of Birth: 05/25/1945 Referring Provider: Lequita HaltAluisio  Encounter Date: 09/21/2016      PT End of Session - 09/21/16 1425    Visit Number 7   Date for PT Re-Evaluation 10/26/16   PT Start Time 1345   PT Stop Time 1440   PT Time Calculation (min) 55 min   Activity Tolerance Patient tolerated treatment well   Behavior During Therapy [facility] for tasks assessed/performed      Past Medical History:  Diagnosis Date  . Anxiety   . Arthritis   . Diabetes mellitus ORAL MEDS ONLY  . Dysrhythmia IRREGULAR  RATE IN HER 20'S  . Fibromyalgia   . GERD (gastroesophageal reflux disease)   . Headache(784.0)   . Hypertension   . Neuropathy    TOES  . Palpitations    OCCASIONAL  . Shortness of breath WITH EXERTION  . Stress incontinence     Past Surgical History:  Procedure Laterality Date  . ABDOMINAL HYSTERECTOMY  80'S   Partial   . ANTERIOR LAT LUMBAR FUSION  03/26/2011   Procedure: ANTERIOR LATERAL LUMBAR FUSION 1 LEVEL;  Surgeon: [name];  [facility]: [facility];  Service: Neurosurgery;  Laterality: Left;  Left Lumbar three-four anterolateral retroperitoneal interbody fusion   . ARTHROSCOPY KNEE W/ DRILLING  09 LEFT   2012 RIGHT  . BARTHOLIN GLAND CYST EXCISION  1983  . CHOLECYSTECTOMY  1989  . CYSTOSCOPY  80'S  . FOOT SURGERY Right   . SPINAL FUSION  2012   Lower Back  . TOTAL KNEE ARTHROPLASTY Left 05/28/2013   Procedure: LEFT TOTAL KNEE ARTHROPLASTY;  Surgeon: [name], MD;  [facility];  Service: Orthopedics;  Laterality: Left;  . TOTAL KNEE ARTHROPLASTY Right 10/29/2013   Procedure: RIGHT TOTAL KNEE ARTHROPLASTY;  Surgeon: Loanne DrillingFrank Aluisio V, MD;  [facility];  Service: Orthopedics;  Laterality: Right;    There were no  vitals filed for this visit.      Subjective Assessment - 09/21/16 1350    Subjective "I am doing ok"   Currently in Pain? Yes   Pain Score 3    Pain Location Leg   Pain Orientation Left;Distal                         [facility] Adult PT Treatment/Exercise - 09/21/16 0001      High Level Balance   High Level Balance Activities Backward walking;Side stepping     Knee/Hip Exercises: Aerobic   Nustep level 6 x706min     Knee/Hip Exercises: Machines for Strengthening   Cybex Knee Extension 10# 2x 15   Cybex Knee Flexion 25# 2x15   Cybex Leg Press 25# 2x15     Knee/Hip Exercises: Standing   Forward Step Up 1 set;Both;10 reps;Step Height: 6";Hand Hold: 2     Knee/Hip Exercises: Seated   Sit to Sand 2 sets;10 reps;without UE support  from chair     Cryotherapy   Number Minutes Cryotherapy 15 Minutes   Cryotherapy Location Knee   Type of Cryotherapy Ice pack     Electrical Stimulation   Electrical Stimulation Location right knee   Electrical Stimulation Action IFC   Electrical Stimulation Parameters elevated   Electrical Stimulation Goals Pain  PT Short Term Goals - 09/09/16 1528      PT SHORT TERM GOAL #1   Title Pt will be ind with HEP   Status Achieved           PT Long Term Goals - 08/26/16 1437      PT LONG TERM GOAL #1   Title Pt will increase strength B knees to 4+/5   Time 8   Period Weeks   Status New     PT LONG TERM GOAL #2   Title Pt will decrease pain by 25%   Time 8   Period Weeks   Status New     PT LONG TERM GOAL #3   Title Pt will be able to perform household activities with 50% decrease in pain.     Time 8   Period Weeks   Status New     PT LONG TERM GOAL #4   Title go up and down stairs step over step without compensation and limp on the right   Time 8   Period Weeks   Status New               Plan - 09/21/16 1426    Clinical Impression Statement Pt reports that he feels better  overall. No reports of hip pain re treatment. She was able to complete all of today's exercises. Does reports that she could feel her hip with step up and side steps.   Rehab Potential Good   PT Frequency 2x / week   PT Duration 8 weeks   PT Treatment/Interventions ADLs/Self Care Home Management;Cryotherapy;Electrical Stimulation;Gait training;Stair training;Functional mobility training;Patient/family education;Therapeutic exercise;Neuromuscular re-education;Therapeutic activities;Manual techniques;Vasopneumatic Device   PT Next Visit Plan continue with flexibility, balance and strength      Patient will benefit from skilled therapeutic intervention in order to improve the following deficits and impairments:  Abnormal gait, Decreased activity tolerance, Decreased balance, Decreased mobility, Decreased strength, Increased edema, Pain, Decreased range of motion, Difficulty walking  Visit Diagnosis: Acute pain of left knee  Acute pain of right knee  Difficulty in walking, not elsewhere classified     Problem List Patient Active Problem List   Diagnosis Date Noted  . Type II or unspecified type diabetes mellitus without mention of complication, uncontrolled 11/08/2013  . Acute blood loss anemia 11/08/2013  . Hx of total knee arthroplasty 06/14/2013  . Hyperlipidemia 06/01/2013  . Hypertension 06/01/2013  . Depression 06/01/2013  . GERD (gastroesophageal reflux disease) 06/01/2013  . Diabetes mellitus (HCC) 06/01/2013  . Postoperative anemia due to acute blood loss 05/29/2013  . Hyponatremia 05/29/2013  . OA (osteoarthritis) of knee 05/28/2013    Grayce Sessions, PTA 09/21/2016, 2:27 PM  [facility] [place] [facility] [address] [place], [place], [address] Phone: [phone]   Fax:  [phone]  Name: Vanessa Frank MRN: [id number] Date of Birth: [birth year]

## 2016-09-23 ENCOUNTER — Encounter: Payer: Self-pay | Admitting: Physical Therapy

## 2016-09-23 ENCOUNTER — Ambulatory Visit: Payer: Medicare Other | Admitting: Physical Therapy

## 2016-09-23 DIAGNOSIS — M25562 Pain in left knee: Secondary | ICD-10-CM

## 2016-09-23 DIAGNOSIS — M25561 Pain in right knee: Secondary | ICD-10-CM

## 2016-09-23 DIAGNOSIS — R262 Difficulty in walking, not elsewhere classified: Secondary | ICD-10-CM

## 2016-09-23 NOTE — Therapy (Signed)
[facility] [address] [place], [place], [address] Phone: [phone]   Fax:  [phone]  Physical Therapy Treatment  Patient Details  Name: Nolberto HanlonLinda R Hearld MRN: [id number] Date of Birth: Aug 14, 1945 Referring Provider: Lequita HaltAluisio  Encounter Date: 09/23/2016      PT End of Session - 09/23/16 1513    Visit Number 8   Date for PT Re-Evaluation 10/26/16   PT Start Time 1430   PT Stop Time 1529   PT Time Calculation (min) 59 min   Activity Tolerance Patient tolerated treatment well   Behavior During Therapy [facility] for tasks assessed/performed      Past Medical History:  Diagnosis Date  . Anxiety   . Arthritis   . Diabetes mellitus ORAL MEDS ONLY  . Dysrhythmia IRREGULAR  RATE IN HER 20'S  . Fibromyalgia   . GERD (gastroesophageal reflux disease)   . Headache(784.0)   . Hypertension   . Neuropathy    TOES  . Palpitations    OCCASIONAL  . Shortness of breath WITH EXERTION  . Stress incontinence     Past Surgical History:  Procedure Laterality Date  . ABDOMINAL HYSTERECTOMY  80'S   Partial   . ANTERIOR LAT LUMBAR FUSION  03/26/2011   Procedure: ANTERIOR LATERAL LUMBAR FUSION 1 LEVEL;  Surgeon: [name];  [facility];  Service: Neurosurgery;  Laterality: Left;  Left Lumbar three-four anterolateral retroperitoneal interbody fusion   . ARTHROSCOPY KNEE W/ DRILLING  09 LEFT   2012 RIGHT  . BARTHOLIN GLAND CYST EXCISION  1983  . CHOLECYSTECTOMY  1989  . CYSTOSCOPY  80'S  . FOOT SURGERY Right   . SPINAL FUSION  2012   Lower Back  . TOTAL KNEE ARTHROPLASTY Left 05/28/2013   Procedure: LEFT TOTAL KNEE ARTHROPLASTY;  Surgeon: [name], MD;  Location: [facility];  Service: Orthopedics;  Laterality: Left;  . TOTAL KNEE ARTHROPLASTY Right 10/29/2013   Procedure: RIGHT TOTAL KNEE ARTHROPLASTY;  Surgeon: Loanne DrillingFrank Aluisio V, MD;  [facility]: [facility];  Service: Orthopedics;  Laterality: Right;    There were no  vitals filed for this visit.      Subjective Assessment - 09/23/16 1431    Subjective "Im doing ok, a little sore but ok" I have some pain in this L hip, not much just enough to be uncomfortable   Currently in Pain? Yes   Pain Score 2    Pain Location Hip   Pain Orientation Left                         [facility] Adult PT Treatment/Exercise - 09/23/16 0001      Knee/Hip Exercises: Aerobic   Nustep level 5 x476min     Knee/Hip Exercises: Machines for Strengthening   Cybex Knee Extension 10# 2x 15   Cybex Knee Flexion 25# 2x15   Cybex Leg Press 30lb 2x15     Knee/Hip Exercises: Standing   Hip Abduction 2 sets;10 reps;Knee straight;Both   Hip Extension 2 sets;10 reps;Knee straight;Both     Knee/Hip Exercises: Seated   Ball Squeeze 2x10   Abduction/Adduction  2 sets;15 reps;Both   Abd/Adduction Limitations green tband    Sit to Sand 2 sets;10 reps;without UE support  mat table      Cryotherapy   Number Minutes Cryotherapy 15 Minutes   Cryotherapy Location Knee   Type of Cryotherapy Ice pack     Electrical Stimulation  Electrical Stimulation Location right knee   Electrical Stimulation Action IFC   Electrical Stimulation Parameters elevated   Electrical Stimulation Goals Pain                  PT Short Term Goals - 09/09/16 1528      PT SHORT TERM GOAL #1   Title Pt will be ind with HEP   Status Achieved           PT Long Term Goals - 08/26/16 1437      PT LONG TERM GOAL #1   Title Pt will increase strength B knees to 4+/5   Time 8   Period Weeks   Status New     PT LONG TERM GOAL #2   Title Pt will decrease pain by 25%   Time 8   Period Weeks   Status New     PT LONG TERM GOAL #3   Title Pt will be able to perform household activities with 50% decrease in pain.     Time 8   Period Weeks   Status New     PT LONG TERM GOAL #4   Title go up and down stairs step over step without compensation and limp on the right   Time 8    Period Weeks   Status New               Plan - 09/23/16 1514    Clinical Impression Statement Pt able to complete all of today's activities, she does report a burning sensation in her hips with seated abductions with a little LBP.    Rehab Potential Good   PT Frequency 2x / week   PT Duration 8 weeks   PT Treatment/Interventions ADLs/Self Care Home Management;Cryotherapy;Electrical Stimulation;Gait training;Stair training;Functional mobility training;Patient/family education;Therapeutic exercise;Neuromuscular re-education;Therapeutic activities;Manual techniques;Vasopneumatic Device   PT Next Visit Plan continue with flexibility, balance and strength      Patient will benefit from skilled therapeutic intervention in order to improve the following deficits and impairments:  Abnormal gait, Decreased activity tolerance, Decreased balance, Decreased mobility, Decreased strength, Increased edema, Pain, Decreased range of motion, Difficulty walking  Visit Diagnosis: Acute pain of left knee  Acute pain of right knee  Difficulty in walking, not elsewhere classified     Problem List Patient Active Problem List   Diagnosis Date Noted  . Type II or unspecified type diabetes mellitus without mention of complication, uncontrolled 11/08/2013  . Acute blood loss anemia 11/08/2013  . Hx of total knee arthroplasty 06/14/2013  . Hyperlipidemia 06/01/2013  . Hypertension 06/01/2013  . Depression 06/01/2013  . GERD (gastroesophageal reflux disease) 06/01/2013  . Diabetes mellitus (HCC) 06/01/2013  . Postoperative anemia due to acute blood loss 05/29/2013  . Hyponatremia 05/29/2013  . OA (osteoarthritis) of knee 05/28/2013    Grayce Sessions, PTA 09/23/2016, 3:16 PM  [facility] [address] [place], [place], [address] Phone: [phone]   Fax:  [phone]  Name: JEWELINE REIF MRN: [id number] Date of Birth:  Nov 07, 1945

## 2016-09-30 ENCOUNTER — Ambulatory Visit: Payer: Medicare Other | Admitting: Physical Therapy

## 2016-09-30 ENCOUNTER — Encounter: Payer: Self-pay | Admitting: Physical Therapy

## 2016-09-30 DIAGNOSIS — M25562 Pain in left knee: Secondary | ICD-10-CM | POA: Diagnosis not present

## 2016-09-30 DIAGNOSIS — R262 Difficulty in walking, not elsewhere classified: Secondary | ICD-10-CM

## 2016-09-30 DIAGNOSIS — M25561 Pain in right knee: Secondary | ICD-10-CM

## 2016-09-30 NOTE — Therapy (Signed)
[facility] [address] [place], [place], [address] Phone: [phone]   Fax:  [phone]  Physical Therapy Treatment  Patient Details  Name: NEYRA PETTIE MRN: [id number] Date of Birth: 06/15/1945 Referring Provider: Lequita Halt  Encounter Date: 09/30/2016      PT End of Session - 09/30/16 1645    Visit Number 9   Date for PT Re-Evaluation 10/26/16   PT Start Time 1555   PT Stop Time 1659   PT Time Calculation (min) 64 min   Activity Tolerance Patient tolerated treatment well   Behavior During Therapy [facility] for tasks assessed/performed      Past Medical History:  Diagnosis Date  . Anxiety   . Arthritis   . Diabetes mellitus ORAL MEDS ONLY  . Dysrhythmia IRREGULAR  RATE IN HER 20'S  . Fibromyalgia   . GERD (gastroesophageal reflux disease)   . Headache(784.0)   . Hypertension   . Neuropathy    TOES  . Palpitations    OCCASIONAL  . Shortness of breath WITH EXERTION  . Stress incontinence     Past Surgical History:  Procedure Laterality Date  . ABDOMINAL HYSTERECTOMY  80'S   Partial   . ANTERIOR LAT LUMBAR FUSION  03/26/2011   Procedure: ANTERIOR LATERAL LUMBAR FUSION 1 LEVEL;  Surgeon: Tia Alert;  [facility];  Service: Neurosurgery;  Laterality: Left;  Left Lumbar three-four anterolateral retroperitoneal interbody fusion   . ARTHROSCOPY KNEE W/ DRILLING  09 LEFT   2012 RIGHT  . BARTHOLIN GLAND CYST EXCISION  1983  . CHOLECYSTECTOMY  1989  . CYSTOSCOPY  80'S  . FOOT SURGERY Right   . SPINAL FUSION  2012   Lower Back  . TOTAL KNEE ARTHROPLASTY Left 05/28/2013   Procedure: LEFT TOTAL KNEE ARTHROPLASTY;  Surgeon: Loanne Drilling, MD;  [facility];  Service: Orthopedics;  Laterality: Left;  . TOTAL KNEE ARTHROPLASTY Right 10/29/2013   Procedure: RIGHT TOTAL KNEE ARTHROPLASTY;  Surgeon: Loanne Drilling, MD;  [facility];  Service: Orthopedics;  Laterality: Right;    There were no  vitals filed for this visit.      Subjective Assessment - 09/30/16 1552    Subjective "Ok, Im a little sore in the L hip"   Currently in Pain? Yes   Pain Score 2    Pain Location Hip   Pain Orientation Left   Pain Descriptors / Indicators Sore            [facility] PT Assessment - 09/30/16 0001      AROM   AROM Assessment Site Knee   Right/Left Knee Right;Left   Right Knee Extension 0   Right Knee Flexion 95   Left Knee Extension 0   Left Knee Flexion 100     Strength   Overall Strength Comments 5/5 left knee and 5/5 right knee                     [facility] Adult PT Treatment/Exercise - 09/30/16 0001      Ambulation/Gait   Stairs Yes   Stairs Assistance 6: Modified independent (Device/Increase time);7: Independent   Stair Management Technique One rail Right;One rail Left;Alternating pattern   Number of Stairs 24   Height of Stairs 6     Knee/Hip Exercises: Aerobic   Nustep level 5 x38min     Knee/Hip Exercises: Machines for Strengthening   Cybex Knee Extension 10# 2x 15  Cybex Knee Flexion 25# 2x15   Cybex Leg Press 40lb 2x10     Knee/Hip Exercises: Standing   Lateral Step Up 2 sets;Both;5 reps;Hand Hold: 2;Step Height: 6"     Knee/Hip Exercises: Seated   Ball Squeeze 2x15   Abduction/Adduction  2 sets;15 reps;Both   Abd/Adduction Limitations manual resistance   Sit to Sand 2 sets;10 reps;without UE support  3lb, from blue chair no UE     Cryotherapy   Number Minutes Cryotherapy 15 Minutes   Cryotherapy Location Knee   Type of Cryotherapy Ice pack     Electrical Stimulation   Electrical Stimulation Location right knee   Electrical Stimulation Action IFC   Electrical Stimulation Parameters elevated   Electrical Stimulation Goals Pain                  PT Short Term Goals - 09/30/16 1631      PT SHORT TERM GOAL #1   Title Pt will be ind with HEP   Status Achieved           PT Long Term Goals - 09/30/16 1639      PT LONG  TERM GOAL #1   Title Pt will increase strength B knees to 4+/5     PT LONG TERM GOAL #4   Title go up and down stairs step over step without compensation and limp on the right   Status Achieved               Plan - 09/30/16 1646    Clinical Impression Statement Pt has progressed completing most of her LTG's. Pt demos good strength and ROM with all exercises, Pt was able to negotiate one flight of stairs with one rail without deviation. Pt does reports R knee and left hip pain, that felt better after stair negotiation.   Rehab Potential Good   PT Frequency 2x / week   PT Duration 8 weeks   PT Treatment/Interventions ADLs/Self Care Home Management;Cryotherapy;Electrical Stimulation;Gait training;Stair training;Functional mobility training;Patient/family education;Therapeutic exercise;Neuromuscular re-education;Therapeutic activities;Manual techniques;Vasopneumatic Device   PT Next Visit Plan Pt sees MD tomorrow 10/01/2016      Patient will benefit from skilled therapeutic intervention in order to improve the following deficits and impairments:  Abnormal gait, Decreased activity tolerance, Decreased balance, Decreased mobility, Decreased strength, Increased edema, Pain, Decreased range of motion, Difficulty walking  Visit Diagnosis: Acute pain of left knee  Acute pain of right knee  Difficulty in walking, not elsewhere classified     Problem List Patient Active Problem List   Diagnosis Date Noted  . Type II or unspecified type diabetes mellitus without mention of complication, uncontrolled 11/08/2013  . Acute blood loss anemia 11/08/2013  . Hx of total knee arthroplasty 06/14/2013  . Hyperlipidemia 06/01/2013  . Hypertension 06/01/2013  . Depression 06/01/2013  . GERD (gastroesophageal reflux disease) 06/01/2013  . Diabetes mellitus (HCC) 06/01/2013  . Postoperative anemia due to acute blood loss 05/29/2013  . Hyponatremia 05/29/2013  . OA (osteoarthritis) of knee  05/28/2013    Grayce Sessionsonald G Pemberton, PTA 09/30/2016, 4:49 PM  [facility] [address] [place], [place], [address] Phone: [phone]   Fax:  [phone]  Name: Nolberto HanlonLinda R Dillavou MRN: [id number] Date of Birth: 03/05/1946

## 2016-10-05 ENCOUNTER — Ambulatory Visit: Payer: Medicare Other | Admitting: Physical Therapy

## 2016-10-05 ENCOUNTER — Encounter: Payer: Self-pay | Admitting: Physical Therapy

## 2016-10-05 DIAGNOSIS — M25561 Pain in right knee: Secondary | ICD-10-CM

## 2016-10-05 DIAGNOSIS — M25562 Pain in left knee: Secondary | ICD-10-CM

## 2016-10-05 DIAGNOSIS — R262 Difficulty in walking, not elsewhere classified: Secondary | ICD-10-CM

## 2016-10-05 DIAGNOSIS — M25662 Stiffness of left knee, not elsewhere classified: Secondary | ICD-10-CM

## 2016-10-05 NOTE — Therapy (Signed)
[facility] [address] [place], [place], [address] Phone: [phone]   Fax:  [phone]  Physical Therapy Treatment  Patient Details  Name: Vanessa Frank MRN: [id number] Date of Birth: 08-15-1945 Referring Provider: Lequita Halt  Encounter Date: 10/05/2016      PT End of Session - 10/05/16 1648    Visit Number 10   Date for PT Re-Evaluation 10/26/16   PT Start Time 1600   PT Stop Time 1704   PT Time Calculation (min) 64 min   Activity Tolerance Patient tolerated treatment well   Behavior During Therapy [facility] for tasks assessed/performed      Past Medical History:  Diagnosis Date  . Anxiety   . Arthritis   . Diabetes mellitus ORAL MEDS ONLY  . Dysrhythmia IRREGULAR  RATE IN HER 20'S  . Fibromyalgia   . GERD (gastroesophageal reflux disease)   . Headache(784.0)   . Hypertension   . Neuropathy    TOES  . Palpitations    OCCASIONAL  . Shortness of breath WITH EXERTION  . Stress incontinence     Past Surgical History:  Procedure Laterality Date  . ABDOMINAL HYSTERECTOMY  80'S   Partial   . ANTERIOR LAT LUMBAR FUSION  03/26/2011   Procedure: ANTERIOR LATERAL LUMBAR FUSION 1 LEVEL;  Surgeon: Tia Alert;  [facility];  Service: Neurosurgery;  Laterality: Left;  Left Lumbar three-four anterolateral retroperitoneal interbody fusion   . ARTHROSCOPY KNEE W/ DRILLING  09 LEFT   2012 RIGHT  . BARTHOLIN GLAND CYST EXCISION  1983  . CHOLECYSTECTOMY  1989  . CYSTOSCOPY  80'S  . FOOT SURGERY Right   . SPINAL FUSION  2012   Lower Back  . TOTAL KNEE ARTHROPLASTY Left 05/28/2013   Procedure: LEFT TOTAL KNEE ARTHROPLASTY;  Surgeon: Loanne Drilling, MD;  [facility];  Service: Orthopedics;  Laterality: Left;  . TOTAL KNEE ARTHROPLASTY Right 10/29/2013   Procedure: RIGHT TOTAL KNEE ARTHROPLASTY;  Surgeon: Loanne Drilling, MD;  [facility];  Service: Orthopedics;  Laterality: Right;    There were no  vitals filed for this visit.      Subjective Assessment - 10/05/16 1603    Subjective Pt reports that she got an injection in her L hip and it really helped a lot. Knees are doing fine.   Currently in Pain? Yes   Pain Score 2    Pain Location Knee                         [facility] Adult PT Treatment/Exercise - 10/05/16 0001      Knee/Hip Exercises: Aerobic   Recumbent Bike L0 x4 min    Nustep level 5 x76min     Knee/Hip Exercises: Machines for Strengthening   Cybex Knee Extension 10# 2x 15   Cybex Knee Flexion 25# 2x15   Cybex Leg Press 40lb 2x10     Knee/Hip Exercises: Standing   Hip Abduction 2 sets;10 reps;Knee straight;Both   Abduction Limitations 2   Hip Extension 2 sets;10 reps;Knee straight;Both   Extension Limitations 2   Lateral Step Up 2 sets;Both;Step Height: 6";Hand Hold: 1;10 reps   Other Standing Knee Exercises standing march 2lb 2x10      Knee/Hip Exercises: Seated   Sit to Sand 2 sets;10 reps;without UE support  from blue chair golding yellow ball  PT Short Term Goals - 09/30/16 1631      PT SHORT TERM GOAL #1   Title Pt will be ind with HEP   Status Achieved           PT Long Term Goals - 09/30/16 1639      PT LONG TERM GOAL #1   Title Pt will increase strength B knees to 4+/5     PT LONG TERM GOAL #4   Title go up and down stairs step over step without compensation and limp on the right   Status Achieved               Plan - 10/05/16 1649    Clinical Impression Statement Pt tolerated all interventions well and reported no pain. Pt reported that she wanted to get on the bike. After 4 minutes on the bike pt reported bilateral knee pain.   Rehab Potential Good   PT Frequency 2x / week   PT Duration 8 weeks   PT Treatment/Interventions ADLs/Self Care Home Management;Cryotherapy;Electrical Stimulation;Gait training;Stair training;Functional mobility training;Patient/family education;Therapeutic  exercise;Neuromuscular re-education;Therapeutic activities;Manual techniques;Vasopneumatic Device   PT Next Visit Plan LE strengthening      Patient will benefit from skilled therapeutic intervention in order to improve the following deficits and impairments:  Abnormal gait, Decreased activity tolerance, Decreased balance, Decreased mobility, Decreased strength, Increased edema, Pain, Decreased range of motion, Difficulty walking  Visit Diagnosis: Acute pain of left knee  Acute pain of right knee  Difficulty in walking, not elsewhere classified  Stiffness of left knee, not elsewhere classified     Problem List Patient Active Problem List   Diagnosis Date Noted  . Type II or unspecified type diabetes mellitus without mention of complication, uncontrolled 11/08/2013  . Acute blood loss anemia 11/08/2013  . Hx of total knee arthroplasty 06/14/2013  . Hyperlipidemia 06/01/2013  . Hypertension 06/01/2013  . Depression 06/01/2013  . GERD (gastroesophageal reflux disease) 06/01/2013  . Diabetes mellitus (HCC) 06/01/2013  . Postoperative anemia due to acute blood loss 05/29/2013  . Hyponatremia 05/29/2013  . OA (osteoarthritis) of knee 05/28/2013    Grayce Sessionsonald G Ulyses Panico, PTA 10/05/2016, 4:51 PM  [facility] [address] [place], [place], [address] Phone: [phone]   Fax:  [phone]  Name: Vanessa HanlonLinda R Frank MRN: [id number] Date of Birth: 05/18/1945

## 2016-10-12 ENCOUNTER — Encounter: Payer: Self-pay | Admitting: Physical Therapy

## 2016-10-12 ENCOUNTER — Ambulatory Visit: Payer: Medicare Other | Admitting: Physical Therapy

## 2016-10-12 DIAGNOSIS — M25562 Pain in left knee: Secondary | ICD-10-CM | POA: Diagnosis not present

## 2016-10-12 DIAGNOSIS — M25561 Pain in right knee: Secondary | ICD-10-CM

## 2016-10-12 DIAGNOSIS — R262 Difficulty in walking, not elsewhere classified: Secondary | ICD-10-CM

## 2016-10-12 NOTE — Therapy (Addendum)
[place], [place], [address] Phone: [phone]   Fax:  [phone]  Physical Therapy Treatment  Patient Details  Name: Vanessa Frank MRN: [id number] Date of Birth: 06/22/1945 Referring Provider: Wynelle Link  Encounter Date: 10/12/2016      PT End of Session - 10/12/16 1801    Visit Number 11   Date for PT Re-Evaluation 10/26/16   PT Start Time 1615   PT Stop Time 1713   PT Time Calculation (min) 58 min   Activity Tolerance Patient tolerated treatment well   Behavior During Therapy [facility] for tasks assessed/performed      Past Medical History:  Diagnosis Date  . Anxiety   . Arthritis   . Diabetes mellitus ORAL MEDS ONLY  . Dysrhythmia IRREGULAR  RATE IN HER 20'S  . Fibromyalgia   . GERD (gastroesophageal reflux disease)   . Headache(784.0)   . Hypertension   . Neuropathy    TOES  . Palpitations    OCCASIONAL  . Shortness of breath WITH EXERTION  . Stress incontinence     Past Surgical History:  Procedure Laterality Date  . ABDOMINAL HYSTERECTOMY  80'S   Partial   . ANTERIOR LAT LUMBAR FUSION  03/26/2011   Procedure: ANTERIOR LATERAL LUMBAR FUSION 1 LEVEL;  Surgeon: Eustace Moore;  [facility];  Service: Neurosurgery;  Laterality: Left;  Left Lumbar three-four anterolateral retroperitoneal interbody fusion   . ARTHROSCOPY KNEE W/ DRILLING  09 LEFT   2012 RIGHT  . BARTHOLIN GLAND CYST EXCISION  1983  . CHOLECYSTECTOMY  1989  . CYSTOSCOPY  80'S  . FOOT SURGERY Right   . SPINAL FUSION  2012   Lower Back  . TOTAL KNEE ARTHROPLASTY Left 05/28/2013   Procedure: LEFT TOTAL KNEE ARTHROPLASTY;  Surgeon: Gearlean Alf, MD;  [facility];  Service: Orthopedics;  Laterality: Left;  . TOTAL KNEE ARTHROPLASTY Right 10/29/2013   Procedure: RIGHT TOTAL KNEE ARTHROPLASTY;  Surgeon: Gearlean Alf, MD;  [facility];  Service: Orthopedics;  Laterality: Right;    There were no  vitals filed for this visit.      Subjective Assessment - 10/12/16 1616    Subjective Pt reports R knee has been a little sore. L knee is fine today. Isn't sleeping on her R side because it hurts her R knee.    Currently in Pain? No/denies   Pain Score 0-No pain                         [facility] Adult PT Treatment/Exercise - 10/12/16 0001      Ambulation/Gait   Gait Comments Stairs step over step, down and up; resisted gait forwards and backwards for 75 feet     Knee/Hip Exercises: Stretches   Passive Hamstring Stretch Both;2 reps;20 seconds   Piriformis Stretch Both;3 reps;20 seconds   Other Knee/Hip Stretches ITB stretch, both; 3 reps, 20 sec     Knee/Hip Exercises: Aerobic   Nustep level 5 x74mn     Knee/Hip Exercises: Machines for Strengthening   Cybex Knee Extension 10# 2x 15   Cybex Knee Flexion 25# 2x15   Cybex Leg Press 40lb 2x10     Knee/Hip Exercises: Supine   Other Supine Knee/Hip Exercises feet on ball K2C, trunk rotations and bridges, ball abdominal isometrics     Cryotherapy   Number Minutes Cryotherapy 15 Minutes   Cryotherapy Location Knee  Type of Cryotherapy Ice pack     Electrical Stimulation   Electrical Stimulation Location right knee   Electrical Stimulation Action IFC   Electrical Stimulation Parameters elevated   Electrical Stimulation Goals Pain                  PT Short Term Goals - 09/30/16 1631      PT SHORT TERM GOAL #1   Title Pt will be ind with HEP   Status Achieved           PT Long Term Goals - 09/30/16 1639      PT LONG TERM GOAL #1   Title Pt will increase strength B knees to 4+/5     PT LONG TERM GOAL #4   Title go up and down stairs step over step without compensation and limp on the right   Status Achieved               Plan - 10/12/16 1802    Clinical Impression Statement Patient did great on the stairs step over step 2 flights, was fatigued with going up.  Was fatigues with  resisted gait.   PT Next Visit Plan LE strengthening endurance   Consulted and Agree with Plan of Care Patient      Patient will benefit from skilled therapeutic intervention in order to improve the following deficits and impairments:  Abnormal gait, Decreased activity tolerance, Decreased balance, Decreased mobility, Decreased strength, Increased edema, Pain, Decreased range of motion, Difficulty walking  Visit Diagnosis: Acute pain of left knee  Acute pain of right knee  Difficulty in walking, not elsewhere classified     Problem List Patient Active Problem List   Diagnosis Date Noted  . Type II or unspecified type diabetes mellitus without mention of complication, uncontrolled 11/08/2013  . Acute blood loss anemia 11/08/2013  . Hx of total knee arthroplasty 06/14/2013  . Hyperlipidemia 06/01/2013  . Hypertension 06/01/2013  . Depression 06/01/2013  . GERD (gastroesophageal reflux disease) 06/01/2013  . Diabetes mellitus ([place]) 06/01/2013  . Postoperative anemia due to acute blood loss 05/29/2013  . Hyponatremia 05/29/2013  . OA (osteoarthritis) of knee 05/28/2013   PHYSICAL THERAPY DISCHARGE SUMMARY   Plan: Patient agrees to discharge.  Patient goals were partially met. Patient is being discharged due to not returning since the last visit.  ?????      Sumner Boast., PT 10/12/2016, 6:03 PM  [address] [place], [place], [address] Phone: [phone]   Fax:  [phone]  Name: Vanessa Frank MRN: [id number] Date of Birth: [birth year]

## 2016-10-18 ENCOUNTER — Ambulatory Visit: Payer: Medicare Other | Admitting: Physical Therapy

## 2016-10-21 ENCOUNTER — Ambulatory Visit: Payer: Medicare Other | Admitting: Physical Therapy

## 2017-06-17 ENCOUNTER — Other Ambulatory Visit: Payer: Self-pay | Admitting: Nurse Practitioner

## 2017-06-17 DIAGNOSIS — R109 Unspecified abdominal pain: Secondary | ICD-10-CM

## 2017-06-29 ENCOUNTER — Ambulatory Visit
Admission: RE | Admit: 2017-06-29 | Discharge: 2017-06-29 | Disposition: A | Discharge status: Home / Self Care | Payer: Medicare Other | Source: Ambulatory Visit | Attending: Nurse Practitioner | Admitting: Nurse Practitioner

## 2017-06-29 DIAGNOSIS — R109 Unspecified abdominal pain: Secondary | ICD-10-CM

## 2017-07-14 ENCOUNTER — Other Ambulatory Visit: Payer: Self-pay | Admitting: Internal Medicine

## 2017-07-14 DIAGNOSIS — R1012 Left upper quadrant pain: Secondary | ICD-10-CM

## 2017-07-25 ENCOUNTER — Ambulatory Visit
Admission: RE | Admit: 2017-07-25 | Discharge: 2017-07-25 | Disposition: A | Discharge status: Home / Self Care | Payer: Medicare Other | Source: Ambulatory Visit | Attending: Internal Medicine | Admitting: Internal Medicine

## 2017-07-25 DIAGNOSIS — R1012 Left upper quadrant pain: Secondary | ICD-10-CM

## 2017-07-25 MED ORDER — IOPAMIDOL (ISOVUE-300) INJECTION 61%
90.0000 mL | Freq: Once | INTRAVENOUS | Status: AC | PRN
  Administered 2017-07-25: 90 mL via INTRAVENOUS

## 2017-08-17 ENCOUNTER — Ambulatory Visit: Payer: Medicare Other | Admitting: Podiatry

## 2017-08-17 ENCOUNTER — Ambulatory Visit (INDEPENDENT_AMBULATORY_CARE_PROVIDER_SITE_OTHER): Payer: Medicare Other

## 2017-08-17 ENCOUNTER — Other Ambulatory Visit: Payer: Self-pay | Admitting: Podiatry

## 2017-08-17 ENCOUNTER — Encounter: Payer: Self-pay | Admitting: Podiatry

## 2017-08-17 VITALS — BP 111/76 | HR 60

## 2017-08-17 DIAGNOSIS — M79671 Pain in right foot: Secondary | ICD-10-CM

## 2017-08-17 DIAGNOSIS — M779 Enthesopathy, unspecified: Secondary | ICD-10-CM

## 2017-08-17 DIAGNOSIS — M79672 Pain in left foot: Secondary | ICD-10-CM | POA: Diagnosis not present

## 2017-08-17 NOTE — Progress Notes (Signed)
Subjective:   Patient ID: Vanessa Frank, female   DOB: 72 y.o.   MRN: [id number]   HPI Patient presents with arch depression right over left with history of knee pain and foot pain and also concerned about nail disease.  Patient has had over-the-counter orthotics but needs something more supportive.  Patient does not smoke likes to be active   Review of Systems  All other systems reviewed and are negative.       Objective:  Physical Exam  Constitutional: She appears well-developed and well-nourished.  Cardiovascular: Intact distal pulses.  Pulmonary/Chest: Effort normal.  Musculoskeletal: Normal range of motion.  Neurological: She is alert.  Skin: Skin is warm.  Nursing note and vitals reviewed.   Neurovascular status found to be intact muscle strength is adequate range of motion within normal limits with patient found to have significant depression of the arch right over left with posterior tibial tendon irritation and swelling around the area.  Also was noted to have mild low level nail disease     Assessment:  Depression of the arch right over left with posterior tibial tendinitis and the structural issues which may be related to the foot structure with mild mycotic nail infection     Plan:  H&P conditions reviewed and recommended orthotics to try to support the plantar arch.  Patient will be seen by rec and I do think most likely from the graphite orthotic with elevation of the medial arch bilateral to provide support.  Patient will be advised by him and I educated patient on this today.  Do not recommend treatment for that nail disease at the current time  X-ray indicates that there is moderate depression of the arch bilateral with no indications of advanced arthritis

## 2017-08-19 ENCOUNTER — Ambulatory Visit: Payer: Medicare Other | Admitting: Podiatry

## 2017-08-30 ENCOUNTER — Other Ambulatory Visit: Payer: Medicare Other | Admitting: Orthotics

## 2017-09-07 ENCOUNTER — Other Ambulatory Visit: Payer: Medicare Other | Admitting: Orthotics

## 2017-09-13 ENCOUNTER — Ambulatory Visit: Payer: Medicare Other | Admitting: Orthotics

## 2017-09-13 DIAGNOSIS — M779 Enthesopathy, unspecified: Secondary | ICD-10-CM

## 2017-09-13 NOTE — Progress Notes (Signed)
Patient presents today with a hx of PTTD/AAF.  Upon assessment, patient has pronounced pes planus w/ a valgus RF deformity.  Patient has medially shifted talus/navicular.  Goal is provide longitudinal arch support and RF stability.  Plan on deep heel cup, hug arch, wide foot orthosis w/ medial flange and varus correction for RF valgus deformity.  Patient educated in the progessive nature of PTTD and financial responsibility.  

## 2017-10-04 ENCOUNTER — Ambulatory Visit (INDEPENDENT_AMBULATORY_CARE_PROVIDER_SITE_OTHER): Payer: Medicare Other | Admitting: Orthotics

## 2017-10-04 DIAGNOSIS — M779 Enthesopathy, unspecified: Secondary | ICD-10-CM

## 2017-10-04 DIAGNOSIS — M79671 Pain in right foot: Secondary | ICD-10-CM

## 2017-10-04 NOTE — Progress Notes (Signed)
Patient came in today to pick up custom made foot orthotics.  The goals were accomplished and the patient reported no dissatisfaction with said orthotics.  Patient was advised of breakin period and how to report any issues. 

## 2019-01-09 ENCOUNTER — Ambulatory Visit: Payer: Medicare Other | Admitting: Family Medicine

## 2020-07-04 ENCOUNTER — Other Ambulatory Visit: Payer: Self-pay

## 2020-07-04 ENCOUNTER — Emergency Department (HOSPITAL_COMMUNITY)
Admission: EM | Admit: 2020-07-04 | Discharge: 2020-07-05 | Disposition: A | Discharge status: Home / Self Care | Payer: Medicare PPO | Attending: Emergency Medicine | Admitting: Emergency Medicine

## 2020-07-04 ENCOUNTER — Encounter (HOSPITAL_COMMUNITY): Payer: Self-pay | Admitting: Emergency Medicine

## 2020-07-04 ENCOUNTER — Emergency Department (HOSPITAL_COMMUNITY): Payer: Medicare PPO

## 2020-07-04 DIAGNOSIS — R059 Cough, unspecified: Secondary | ICD-10-CM | POA: Diagnosis not present

## 2020-07-04 DIAGNOSIS — R42 Dizziness and giddiness: Secondary | ICD-10-CM | POA: Diagnosis not present

## 2020-07-04 DIAGNOSIS — R079 Chest pain, unspecified: Secondary | ICD-10-CM | POA: Diagnosis present

## 2020-07-04 DIAGNOSIS — R0789 Other chest pain: Secondary | ICD-10-CM | POA: Diagnosis not present

## 2020-07-04 DIAGNOSIS — E1140 Type 2 diabetes mellitus with diabetic neuropathy, unspecified: Secondary | ICD-10-CM | POA: Insufficient documentation

## 2020-07-04 DIAGNOSIS — R110 Nausea: Secondary | ICD-10-CM | POA: Insufficient documentation

## 2020-07-04 DIAGNOSIS — R531 Weakness: Secondary | ICD-10-CM | POA: Diagnosis not present

## 2020-07-04 DIAGNOSIS — R5383 Other fatigue: Secondary | ICD-10-CM | POA: Insufficient documentation

## 2020-07-04 DIAGNOSIS — R519 Headache, unspecified: Secondary | ICD-10-CM | POA: Insufficient documentation

## 2020-07-04 DIAGNOSIS — Z91040 Latex allergy status: Secondary | ICD-10-CM | POA: Insufficient documentation

## 2020-07-04 DIAGNOSIS — Z7984 Long term (current) use of oral hypoglycemic drugs: Secondary | ICD-10-CM | POA: Diagnosis not present

## 2020-07-04 DIAGNOSIS — R0602 Shortness of breath: Secondary | ICD-10-CM | POA: Insufficient documentation

## 2020-07-04 DIAGNOSIS — Z96653 Presence of artificial knee joint, bilateral: Secondary | ICD-10-CM | POA: Insufficient documentation

## 2020-07-04 DIAGNOSIS — M542 Cervicalgia: Secondary | ICD-10-CM | POA: Insufficient documentation

## 2020-07-04 DIAGNOSIS — I10 Essential (primary) hypertension: Secondary | ICD-10-CM | POA: Diagnosis not present

## 2020-07-04 DIAGNOSIS — Z79899 Other long term (current) drug therapy: Secondary | ICD-10-CM | POA: Diagnosis not present

## 2020-07-04 LAB — BASIC METABOLIC PANEL
Anion gap: 9 (ref 5–15)
BUN: 21 mg/dL (ref 8–23)
CO2: 27 mmol/L (ref 22–32)
Calcium: 8.9 mg/dL (ref 8.9–10.3)
Chloride: 99 mmol/L (ref 98–111)
Creatinine, Ser: 1.25 mg/dL — ABNORMAL HIGH (ref 0.44–1.00)
GFR, Estimated: 45 mL/min — ABNORMAL LOW (ref >60)
Glucose, Bld: 105 mg/dL — ABNORMAL HIGH (ref 70–99)
Potassium: 3.4 mmol/L — ABNORMAL LOW (ref 3.5–5.1)
Sodium: 135 mmol/L (ref 135–145)

## 2020-07-04 LAB — CBC
HCT: 33.3 % — ABNORMAL LOW (ref 36.0–46.0)
Hemoglobin: 11.1 g/dL — ABNORMAL LOW (ref 12.0–15.0)
MCH: 29.4 pg (ref 26.0–34.0)
MCHC: 33.3 g/dL (ref 30.0–36.0)
MCV: 88.3 fL (ref 80.0–100.0)
Platelets: 313 10*3/uL (ref 150–400)
RBC: 3.77 MIL/uL — ABNORMAL LOW (ref 3.87–5.11)
RDW: 13.8 % (ref 11.5–15.5)
WBC: 4.9 10*3/uL (ref 4.0–10.5)
nRBC: 0 % (ref 0.0–0.2)

## 2020-07-04 LAB — TROPONIN I (HIGH SENSITIVITY): Troponin I (High Sensitivity): 7 ng/L (ref <18)

## 2020-07-04 NOTE — ED Triage Notes (Signed)
Pt. Stated, I always have SOB it just seems worse in the last couple of days. Vanessa Frank been so tired even when Im sitting. I also feel like I have a weight on my chest.

## 2020-07-05 ENCOUNTER — Other Ambulatory Visit: Payer: Self-pay

## 2020-07-05 LAB — D-DIMER, QUANTITATIVE: D-Dimer, Quant: 0.47 ug/mL-FEU (ref 0.00–0.50)

## 2020-07-05 LAB — TROPONIN I (HIGH SENSITIVITY): Troponin I (High Sensitivity): 6 ng/L (ref <18)

## 2020-07-05 MED ORDER — ACETAMINOPHEN 325 MG PO TABS
650.0000 mg | ORAL_TABLET | Freq: Once | ORAL | Status: AC
  Administered 2020-07-05: 650 mg via ORAL
  Filled 2020-07-05: qty 2

## 2020-07-05 NOTE — Discharge Instructions (Addendum)
Thank you for allowing me to care for you today in the Emergency Department.   You should be receiving a call from cardiology to schedule a follow-up appointment.  I have also listed the information above if you do not hear back so that you can schedule a follow-up appointment.  Continue to take your home medications as prescribed.  Return to the emergency department if you pass out, develop respiratory distress, develop worsening chest pain breath numbness or weakness in your arm, if your fingers or lips turn blue, if you develop uncontrollable vomiting, or other new, concerning symptoms.

## 2020-07-05 NOTE — ED Provider Notes (Signed)
[facility] EMERGENCY DEPARTMENT Provider Note   CSN: [id number] Arrival date & time: 07/04/20  1842     History Chief Complaint  Patient presents with  . Shortness of Breath  . Weakness  . Headache  . Fatigue    Vanessa HanlonLinda R Folts is a 75 y.o. female with a history of hypertension, diabetes mellitus, aortic atherosclerosis, varicose veins, fibromyalgia, and anxiety who presents to the emergency department from urgent care with a chief complaint of chest pain.  The patient reports that she has been having intermittent chest pain for months, but reports that her symptoms have been worse over the last few days.  She describes the left-sided chest pain as "feeling as if I have a brick on my chest".  Yesterday, she reports that she had the worst episode of pain to date.  She is also having pain between her shoulder blades and to the left side of her neck, but is unsure if this pain is radiating from her chest discomfort.  She reports that she is chronically short of breath, but reports that it has been worse over the last few days.  Yesterday, when chest pain worsened, her shortness of breath also worsened and she felt extremely weak and fatigued.  She also endorses a new headache that began 2 days ago and is located over her bilateral forehead and to to the back of her head.  She also endorses a mild cough, lightheadedness, and intermittent nausea over the last few days.  Symptoms seem worse with exertion and somewhat improved with rest.  She is unsure if chest pain is pleuritic.  It is not worse with eating.  She is chronically on oxycodone, but reports that this is not helped her headache.  She denies numbness, slurred speech, facial droop, fever, chills, leg swelling, orthopnea, neck stiffness, dysuria.   She was last seen by a cardiologist approximately 5 years ago.  She had a negative stress test at that time.  She is a never smoker.  She denies alcohol or illicit or  recreational substance use.  She reports that both her mother and her sister have had blood clots previously due to a clotting disorder.  The patient denies a history of DVT or PE.  The history is provided by the patient and medical records. No language interpreter was used.       Past Medical History:  Diagnosis Date  . Anxiety   . Arthritis   . Diabetes mellitus ORAL MEDS ONLY  . Dysrhythmia IRREGULAR  RATE IN HER 20'S  . Fibromyalgia   . GERD (gastroesophageal reflux disease)   . Headache(784.0)   . Hypertension   . Neuropathy    TOES  . Palpitations    OCCASIONAL  . Shortness of breath WITH EXERTION  . Stress incontinence     Patient Active Problem List   Diagnosis Date Noted  . Type II or unspecified type diabetes mellitus without mention of complication, uncontrolled 11/08/2013  . Acute blood loss anemia 11/08/2013  . Hx of total knee arthroplasty 06/14/2013  . Hyperlipidemia 06/01/2013  . Hypertension 06/01/2013  . Depression 06/01/2013  . GERD (gastroesophageal reflux disease) 06/01/2013  . Diabetes mellitus (HCC) 06/01/2013  . Postoperative anemia due to acute blood loss 05/29/2013  . Hyponatremia 05/29/2013  . OA (osteoarthritis) of knee 05/28/2013    Past Surgical History:  Procedure Laterality Date  . ABDOMINAL HYSTERECTOMY  80'S   Partial   . ANTERIOR LAT LUMBAR FUSION  03/26/2011  Procedure: ANTERIOR LATERAL LUMBAR FUSION 1 LEVEL;  Surgeon: Tia Alert;  Location: [facility];  Service: Neurosurgery;  Laterality: Left;  Left Lumbar three-four anterolateral retroperitoneal interbody fusion   . ARTHROSCOPY KNEE W/ DRILLING  09 LEFT   2012 RIGHT  . BARTHOLIN GLAND CYST EXCISION  1983  . CHOLECYSTECTOMY  1989  . CYSTOSCOPY  80'S  . FOOT SURGERY Right   . SPINAL FUSION  2012   Lower Back  . TOTAL KNEE ARTHROPLASTY Left 05/28/2013   Procedure: LEFT TOTAL KNEE ARTHROPLASTY;  Surgeon: Loanne Drilling, MD;  Location: [facility];  Service: Orthopedics;   Laterality: Left;  . TOTAL KNEE ARTHROPLASTY Right 10/29/2013   Procedure: RIGHT TOTAL KNEE ARTHROPLASTY;  Surgeon: Loanne Drilling, MD;  Location: [facility];  Service: Orthopedics;  Laterality: Right;     OB History   No obstetric history on file.     Family History  Problem Relation Age of Onset  . Depression Mother   . Congestive Heart Failure Mother   . Allergy (severe) Mother   . Heart disease Mother   . Alcoholism Mother   . Hypertension Mother   . Osteoarthritis Mother   . Congestive Heart Failure Father   . Cancer Father   . Depression Maternal Grandmother   . Osteoarthritis Maternal Grandmother   . Depression Sister   . Rheum arthritis Paternal Grandmother   . Osteoarthritis Paternal Grandmother   . Osteoarthritis Sister   . Clotting disorder Sister     Social History   Tobacco Use  . Smoking status: Never Smoker  . Smokeless tobacco: Never Used  Substance Use Topics  . Alcohol use: No  . Drug use: No    Home Medications Prior to Admission medications   Medication Sig Start Date End Date Taking? Authorizing Provider  atorvastatin (LIPITOR) 10 MG tablet Take 10 mg by mouth at bedtime.   Yes [provider]  B Complex Vitamins (VITAMIN B COMPLEX) TABS Take 1 tablet by mouth daily.   Yes [provider]  carboxymethylcellulose (REFRESH PLUS) 0.5 % SOLN Place 1 drop into both eyes 3 (three) times daily as needed (dry eyes).   Yes [provider]  ELDERBERRY PO Take 1 capsule by mouth daily.   Yes [provider]  furosemide (LASIX) 40 MG tablet Take 40 mg by mouth every morning.   Yes [provider]  lisinopril (PRINIVIL,ZESTRIL) 10 MG tablet Take 10 mg by mouth every morning.   Yes [provider]  melatonin 5 MG TABS Take 5 mg by mouth at bedtime.   Yes [provider]  metFORMIN (GLUCOPHAGE) 1000 MG tablet Take 1,000 mg by mouth 2 (two) times daily with a meal.   Yes [provider]   Multiple Vitamin (MULTIVITAMIN) capsule Take 1 capsule by mouth daily.   Yes [provider]  omeprazole (PRILOSEC) 40 MG capsule Take 40 mg by mouth daily. 02/05/20  Yes [provider]  oxyCODONE-acetaminophen (PERCOCET) 10-325 MG tablet Take 1-2 tablets by mouth daily as needed for pain. 06/15/17  Yes [provider]  pioglitazone (ACTOS) 30 MG tablet TAKE 1 TABLET BY MOUTH EVERY MORNING Patient taking differently: Take 30 mg by mouth daily. 03/18/14  Yes [name], [name], DO  sertraline (ZOLOFT) 100 MG tablet Take 100 mg by mouth every morning.   Yes [provider]    Allergies    Adhesive [tape], Codeine, Latex, and Chocolate  Review of Systems   Review of  Systems  Constitutional: Negative for activity change, chills, diaphoresis and fever.  HENT: Negative for congestion and sore throat.   Eyes: Negative for visual disturbance.  Respiratory: Positive for shortness of breath. Negative for cough, choking, chest tightness and stridor.   Cardiovascular: Positive for chest pain. Negative for leg swelling.  Gastrointestinal: Positive for nausea. Negative for abdominal pain, blood in stool, constipation, diarrhea and vomiting.  Genitourinary: Negative for dysuria and urgency.  Musculoskeletal: Negative for back pain, gait problem, joint swelling, myalgias, neck pain and neck stiffness.  Skin: Negative for rash and wound.  Allergic/Immunologic: Negative for immunocompromised state.  Neurological: Positive for weakness, light-headedness and headaches. Negative for dizziness, tremors, seizures, syncope, speech difficulty and numbness.  Psychiatric/Behavioral: Negative for confusion.    Physical Exam Updated Vital Signs BP (!) 137/59 (BP Location: Right Arm)   Pulse 65   Temp 98.1 F (36.7 C) (Oral)   Resp 16   Ht 5\' 2"  (1.575 m)   Wt 90.7 kg   SpO2 99%   BMI 36.58 kg/m   Physical Exam Vitals and nursing note reviewed.  Constitutional:       General: She is not in acute distress.    Appearance: She is obese. She is not ill-appearing, toxic-appearing or diaphoretic.     Comments: No acute distress.  HENT:     Head: Normocephalic.  Eyes:     Extraocular Movements: Extraocular movements intact.     Conjunctiva/sclera: Conjunctivae normal.     Pupils: Pupils are equal, round, and reactive to light.  Cardiovascular:     Rate and Rhythm: Normal rate and regular rhythm.     Heart sounds: No murmur heard. No friction rub. No gallop.      Comments: Peripheral pulses are intact and symmetric. Pulmonary:     Effort: Pulmonary effort is normal. No respiratory distress.     Breath sounds: Normal breath sounds. No stridor. No wheezing, rhonchi or rales.     Comments: Lungs are clear to auscultation bilaterally.  No increased work of breathing. Chest:     Chest wall: No tenderness.  Abdominal:     General: There is no distension.     Palpations: Abdomen is soft. There is no mass.     Tenderness: There is no abdominal tenderness. There is no right CVA tenderness, left CVA tenderness, guarding or rebound.     Hernia: No hernia is present.  Musculoskeletal:        General: No tenderness.     Cervical back: Neck supple.     Right lower leg: No edema.     Left lower leg: No edema.     Comments: No peripheral edema.  No calf tenderness bilaterally.  Skin:    General: Skin is warm.     Findings: No rash.     Comments: Multiple varicose veins noted to the bilateral lower extremities.  Neurological:     Mental Status: She is alert.  Psychiatric:        Behavior: Behavior normal.     ED Results / Procedures / Treatments   Labs (all labs ordered are listed, but only abnormal results are displayed) Labs Reviewed  BASIC METABOLIC PANEL - Abnormal; Notable for the following components:      Result Value   Potassium 3.4 (*)    Glucose, Bld 105 (*)    Creatinine, Ser 1.25 (*)    GFR, Estimated 45 (*)    All other components within  normal limits  CBC -  Abnormal; Notable for the following components:   RBC 3.77 (*)    Hemoglobin 11.1 (*)    HCT 33.3 (*)    All other components within normal limits  D-DIMER, QUANTITATIVE  TROPONIN I (HIGH SENSITIVITY)  TROPONIN I (HIGH SENSITIVITY)    EKG EKG Interpretation  Date/Time:  Friday July 04 2020 18:47:21 EDT Ventricular Rate:  68 PR Interval:  134 QRS Duration: 88 QT Interval:  398 QTC Calculation: 423 R Axis:   19 Text Interpretation: Normal sinus rhythm Septal infarct , age undetermined Abnormal ECG No acute changes Confirmed by Drema Pry [phone]) on 07/04/2020 11:21:25 PM   Radiology DG Chest 2 View  Result Date: 07/04/2020 CLINICAL DATA:  Shortness of breath. EXAM: CHEST - 2 VIEW COMPARISON:  May 24, 2013 FINDINGS: The heart, hila, and mediastinum are normal. No pneumothorax. No nodules or masses. No focal infiltrates. IMPRESSION: No active cardiopulmonary disease. Electronically Signed   By: Gerome Sam III M.D   On: 07/04/2020 19:26    Procedures Procedures   Medications Ordered in ED Medications  acetaminophen (TYLENOL) tablet 650 mg (650 mg Oral Given 07/05/20 0520)    ED Course  I have reviewed the triage vital signs and the nursing notes.  Pertinent labs & imaging results that were available during my care of the patient were reviewed by me and considered in my medical decision making (see chart for details).    MDM Rules/Calculators/A&P                          75 year old female with a history of hypertension, diabetes mellitus, aortic atherosclerosis, varicose veins, fibromyalgia, and anxiety who presents the emergency department from urgent care with chest pain, shortness of breath, nausea, headache, lightheadedness, generalized weakness.  She reports a longstanding history of chest pain and shortness of breath, but reports symptoms have worsened over the last few days.  Patient was seen and independently evaluated by Dr.  Eudelia Bunch, attending physician.  Vital signs are stable.  Labs and imaging have been reviewed and independently interpreted by me.  EKG unchanged from previous. Chest x-ray is unremarkable. No metabolic derangements.  Delta troponin is not elevated. HEAR score is 5.  Hemoglobin is low, but improved from previous.  Creatinine is stable from previous.  Differential diagnosis includes ACS.  Could also consider PE given family history of clotting disorder and family reports patient has been more sedentary.  Less suspicious for aortic dissection, tension pneumothorax, CHF exacerbation, COVID-19, Pericarditis, myocarditis.  However, symptoms do seem atypical for PE.  Thus, D-dimer was obtained and was not elevated.  In the it is reasonable to discharge the patient home with an outpatient referral to cardiology since she did previously have imaging that demonstrated atherosclerosis.  Patient's and her daughter-in-law are agreeable at this time.  ER return precautions given.  She is hemodynamically stable in no acute distress.  Safe discharge home with outpatient follow-up as indicated.  Final Clinical Impression(s) / ED Diagnoses Final diagnoses:  Atypical chest pain    Rx / DC Orders ED Discharge Orders         Ordered    Ambulatory referral to Cardiology        07/05/20 0620           Barkley Boards, PA-C 07/05/20 0820    Nira Conn, MD 07/06/20 [phone]

## 2020-07-15 ENCOUNTER — Ambulatory Visit: Payer: Medicare PPO | Admitting: Internal Medicine

## 2020-07-15 ENCOUNTER — Other Ambulatory Visit: Payer: Self-pay

## 2020-07-15 ENCOUNTER — Encounter: Payer: Self-pay | Admitting: Internal Medicine

## 2020-07-15 VITALS — BP 140/73 | HR 83 | Ht 62.0 in | Wt 206.0 lb

## 2020-07-15 DIAGNOSIS — R079 Chest pain, unspecified: Secondary | ICD-10-CM | POA: Diagnosis not present

## 2020-07-15 DIAGNOSIS — R072 Precordial pain: Secondary | ICD-10-CM

## 2020-07-15 DIAGNOSIS — E119 Type 2 diabetes mellitus without complications: Secondary | ICD-10-CM | POA: Diagnosis not present

## 2020-07-15 DIAGNOSIS — I10 Essential (primary) hypertension: Secondary | ICD-10-CM

## 2020-07-15 DIAGNOSIS — Z8249 Family history of ischemic heart disease and other diseases of the circulatory system: Secondary | ICD-10-CM

## 2020-07-15 MED ORDER — METOPROLOL TARTRATE 50 MG PO TABS: ORAL_TABLET | ORAL | 0 refills | Status: DC

## 2020-07-15 NOTE — Patient Instructions (Addendum)
Medication Instructions:  Your physician recommends that you continue on your current medications as directed. Please refer to the Current Medication list given to you today.  *If you need a refill on your cardiac medications before your next appointment, please call your pharmacy*   Lab Work: BMET -- please complete once you get the call to schedule your CT  If you have labs (blood work) drawn today and your tests are completely normal, you will receive your results only by: Marland Kitchen MyChart Message (if you have MyChart) OR . A paper copy in the mail If you have any lab test that is abnormal or we need to change your treatment, we will call you to review the results.   Testing/Procedures: Cardiac CT @ [facility]    Follow-Up: At [facility], you and your health needs are our priority.  As part of our continuing mission to provide you with exceptional heart care, we have created designated Provider Care Teams.  These Care Teams include your primary Cardiologist (physician) and Advanced Practice Providers (APPs -  Physician Assistants and Nurse Practitioners) who all work together to provide you with the care you need, when you need it.  We recommend signing up for the patient portal called "MyChart".  Sign up information is provided on this After Visit Summary.  MyChart is used to connect with patients for Virtual Visits (Telemedicine).  Patients are able to view lab/test results, encounter notes, upcoming appointments, etc.  Non-urgent messages can be sent to your provider as well.   To learn more about what you can do with MyChart, go to [email or web address].    Your next appointment:   6-8 week(s)  The format for your next appointment:   In Person  Provider:   You may see Dr. Rennis Golden or one of the following Advanced Practice Providers on your designated Care Team:    Azalee Course, PA-C  Micah Flesher, [place] or   Judy Pimple, [place]    Other Instructions  Your cardiac CT will  be scheduled at one of the below locations:   [facility] [address] [place], [place] [address] [phone]  OR  [facility] [address] [place], [place] [address] [phone]  If scheduled at [facility], please arrive at the [facility] main entrance (entrance A) of [facility] 30 minutes prior to test start time. Proceed to the [facility] Radiology Department (first floor) to check-in and test prep.  If scheduled at [facility], please arrive 15 mins early for check-in and test prep.  Please follow these instructions carefully (unless otherwise directed):   On the Night Before the Test: . Be sure to Drink plenty of water. . Do not consume any caffeinated/decaffeinated beverages or chocolate 12 hours prior to your test. . Do not take any antihistamines 12 hours prior to your test.  On the Day of the Test: . Drink plenty of water until 1 hour prior to the test. . Do not eat any food 4 hours prior to the test. . You may take your regular medications prior to the test.  . Take metoprolol (Lopressor) two hours prior to test. . FEMALES- please wear underwire-free bra if available   After the Test: . Drink plenty of water. . After receiving IV contrast, you may experience a mild flushed feeling. This is normal. . On occasion, you may experience a mild rash up to 24 hours after the test. This  is not dangerous. If this occurs, you can take Benadryl 25 mg and increase your fluid intake. . If you experience trouble breathing, this can be serious. If it is severe call 911 IMMEDIATELY. If it is mild, please call our office. . If you take any of these medications: Glipizide/Metformin, Avandament, Glucavance, please do not take 48 hours after completing test unless otherwise instructed.   Once we have confirmed authorization from your insurance company, we will call you to set  up a date and time for your test. Based on how quickly your insurance processes prior authorizations requests, please allow up to 4 weeks to be contacted for scheduling your Cardiac CT appointment. Be advised that routine Cardiac CT appointments could be scheduled as many as 8 weeks after your provider has ordered it.  For non-scheduling related questions, please contact the cardiac imaging nurse navigator should you have any questions/concerns: Rockwell Alexandria, Cardiac Imaging Nurse Navigator Larey Brick, Cardiac Imaging Nurse Navigator [facility] Direct Office Dial: [phone]   For scheduling needs, including cancellations and rescheduling, please call [place], [phone].

## 2020-07-15 NOTE — Progress Notes (Signed)
OFFICE CONSULT NOTE  Chief Complaint:  Chest pain  Primary Care Physician: [facility]  HPI:  Vanessa Frank is a 75 y.o. female who is being seen today for the evaluation of chest pain at the request of McDonald, Mia A, PA-C.  This is a pleasant 75 year old female kindly referred for evaluation management of chest pain.  She was just seen in the ER on March 19 for a chest pain equivalent.  She actually describes it as chest pressure but more across her back.  The symptoms were fairly constant.  She does say somewhat worse with exertion and.  She does have a history of fibromyalgia and chronic pain as well as type 2 diabetes and hypertension and there is family history of heart disease in her mother who had angina.  She apparently had work-up for similar symptoms about 5 or 6 years ago at [facility] cardiology.  After some discussion we determined she saw Dr. Carolanne Grumbling and had stress testing.  Those results are not in the epic charting system.  Apparently everything was negative.  Rule out for MI.  She had an EKG which showed septal infarct however this just indicates Q waves in V1 and V2 but I explained to them that she would need involvement of lead V3 for true septal infarct and I doubt that she has had prior infarct.  The pain across her back is somewhat concerning for possibly aortic pathology although blood pressure seems to be well controlled.  She did have a chest x-ray which showed no evidence of widened mediastinum or any cardiopulmonary disease.  A CT was not performed.  Troponin was negative x2.  D-dimer was also negative  PMHx:  Past Medical History:  Diagnosis Date  . Anxiety   . Arthritis   . Diabetes mellitus ORAL MEDS ONLY  . Dysrhythmia IRREGULAR  RATE IN HER 20'S  . Fibromyalgia   . GERD (gastroesophageal reflux disease)   . Headache(784.0)   . Hypertension   . Neuropathy    TOES  . Palpitations    OCCASIONAL  . Shortness of breath WITH EXERTION   . Stress incontinence     Past Surgical History:  Procedure Laterality Date  . ABDOMINAL HYSTERECTOMY  80'S   Partial   . ANTERIOR LAT LUMBAR FUSION  03/26/2011   Procedure: ANTERIOR LATERAL LUMBAR FUSION 1 LEVEL;  Surgeon: Tia Alert;  Location: [facility];  Service: Neurosurgery;  Laterality: Left;  Left Lumbar three-four anterolateral retroperitoneal interbody fusion   . ARTHROSCOPY KNEE W/ DRILLING  09 LEFT   2012 RIGHT  . BARTHOLIN GLAND CYST EXCISION  1983  . CHOLECYSTECTOMY  1989  . CYSTOSCOPY  80'S  . FOOT SURGERY Right   . SPINAL FUSION  2012   Lower Back  . TOTAL KNEE ARTHROPLASTY Left 05/28/2013   Procedure: LEFT TOTAL KNEE ARTHROPLASTY;  Surgeon: Loanne Drilling, MD;  Location: [facility];  Service: Orthopedics;  Laterality: Left;  . TOTAL KNEE ARTHROPLASTY Right 10/29/2013   Procedure: RIGHT TOTAL KNEE ARTHROPLASTY;  Surgeon: Loanne Drilling, MD;  Location: [facility];  Service: Orthopedics;  Laterality: Right;    FAMHx:  Family History  Problem Relation Age of Onset  . Depression Mother   . Congestive Heart Failure Mother   . Allergy (severe) Mother   . Heart disease Mother   . Alcoholism Mother   . Hypertension Mother   . Osteoarthritis Mother   . Congestive Heart Failure Father   .  Cancer Father   . Depression Maternal Grandmother   . Osteoarthritis Maternal Grandmother   . Depression Sister   . Rheum arthritis Paternal Grandmother   . Osteoarthritis Paternal Grandmother   . Osteoarthritis Sister   . Clotting disorder Sister     SOCHx:   reports that she has never smoked. She has never used smokeless tobacco. She reports that she does not drink alcohol and does not use drugs.  ALLERGIES:  Allergies  Allergen Reactions  . Adhesive [Tape] Itching  . Codeine Nausea Only  . Latex Itching  . Chocolate Rash    ROS: Pertinent items noted in HPI and remainder of comprehensive ROS otherwise negative.  HOME MEDS: Current Outpatient Medications on File Prior  to Visit  Medication Sig Dispense Refill  . atorvastatin (LIPITOR) 10 MG tablet Take 10 mg by mouth at bedtime.    . B Complex Vitamins (VITAMIN B COMPLEX) TABS Take 1 tablet by mouth daily.    . carboxymethylcellulose (REFRESH PLUS) 0.5 % SOLN Place 1 drop into both eyes 3 (three) times daily as needed (dry eyes).    Marland Kitchen ELDERBERRY PO Take 1 capsule by mouth daily.    . furosemide (LASIX) 40 MG tablet Take 40 mg by mouth every morning.    Marland Kitchen lisinopril (PRINIVIL,ZESTRIL) 10 MG tablet Take 10 mg by mouth every morning.    . melatonin 5 MG TABS Take 5 mg by mouth at bedtime.    . metFORMIN (GLUCOPHAGE) 1000 MG tablet Take 1,000 mg by mouth 2 (two) times daily with a meal.    . Multiple Vitamin (MULTIVITAMIN) capsule Take 1 capsule by mouth daily.    Marland Kitchen oxyCODONE-acetaminophen (PERCOCET) 10-325 MG tablet Take 1-2 tablets by mouth daily as needed for pain.  0  . pioglitazone (ACTOS) 30 MG tablet TAKE 1 TABLET BY MOUTH EVERY MORNING (Patient taking differently: Take 30 mg by mouth daily.) 90 tablet 0  . sertraline (ZOLOFT) 100 MG tablet Take 100 mg by mouth every morning.     No current facility-administered medications on file prior to visit.    LABS/IMAGING: No results found for this or any previous visit (from the past 48 hour(s)). No results found.  LIPID PANEL: No results found for: CHOL, TRIG, HDL, CHOLHDL, VLDL, LDLCALC, LDLDIRECT  WEIGHTS: Wt Readings from Last 3 Encounters:  07/15/20 206 lb (93.4 kg)  07/05/20 200 lb (90.7 kg)  11/08/13 224 lb (101.6 kg)    VITALS: BP 140/73   Pulse 83   Ht 5\' 2"  (1.575 m)   Wt 206 lb (93.4 kg)   SpO2 96%   BMI 37.68 kg/m   EXAM: General appearance: alert and no distress Neck: no carotid bruit, no JVD and thyroid not enlarged, symmetric, no tenderness/mass/nodules Lungs: clear to auscultation bilaterally Heart: regular rate and rhythm, S1, S2 normal, no murmur, click, rub or gallop Abdomen: soft, non-tender; bowel sounds normal; no  masses,  no organomegaly Extremities: extremities normal, atraumatic, no cyanosis or edema Pulses: 2+ and symmetric Skin: Skin color, texture, turgor normal. No rashes or lesions Neurologic: Grossly normal Psych: Pleasant  EKG: Normal sinus rhythm at 82- personally reviewed  ASSESSMENT: 1. Chest and upper back pain 2. Type 2 diabetes 3. Hypertension 4. Family history of CAD in mom  PLAN: 1.   Vanessa Frank has been having some chest and upper back pain which is somewhat worse with exertion and relieved by rest.  She also has it at rest.  She has a history of some  fibromyalgia but some risk factors for heart disease including age, hypertension, diabetes and family history of heart disease in her mother.  I would recommend a CT coronary angiogram with FFR for definitive coronary evaluation.  This is a high negative predictive value and could be very helpful at either identifying or excluding coronary artery disease.  We will follow up with her afterwards.  She will likely need 100 mg metoprolol prior to the procedure for heart rate lowering.  She also received nitroglycerin as per protocol.  I explained this in depth to her today.  Follow-up with me afterwards.  Chrystie Nose, MD, [facility], FACP  [place]  [facility]  Medical Director of the Advanced Lipid Disorders &  Cardiovascular Risk Reduction Clinic Diplomate of the American Board of Clinical Lipidology Attending Cardiologist  Direct Dial: [phone]  Fax: [phone]  Website:  [email or web address] 07/15/2020, 10:22 AM

## 2020-08-05 LAB — BASIC METABOLIC PANEL
BUN/Creatinine Ratio: 14 (ref 12–28)
BUN: 18 mg/dL (ref 8–27)
CO2: 25 mmol/L (ref 20–29)
Calcium: 9.8 mg/dL (ref 8.7–10.3)
Chloride: 100 mmol/L (ref 96–106)
Creatinine, Ser: 1.26 mg/dL — ABNORMAL HIGH (ref 0.57–1.00)
Glucose: 106 mg/dL — ABNORMAL HIGH (ref 65–99)
Potassium: 4.2 mmol/L (ref 3.5–5.2)
Sodium: 140 mmol/L (ref 134–144)
eGFR: 44 mL/min/{1.73_m2} — ABNORMAL LOW (ref >59)

## 2020-08-07 ENCOUNTER — Telehealth (HOSPITAL_COMMUNITY): Payer: Self-pay | Admitting: *Deleted

## 2020-08-07 NOTE — Telephone Encounter (Signed)
Pt returning call regarding upcoming cardiac imaging study; pt verbalizes understanding of appt date/time, parking situation and where to check in, pre-test NPO status and medications ordered, and verified current allergies; name and call back number provided for further questions should they arise  Larey Brick RN Navigator Cardiac Imaging [facility] [phone] office [phone] cell  Pt to arrive at noon at the infusion center and to take 50mg  metoprolol tartrate 2 hours prior to cardiac CT scan.

## 2020-08-07 NOTE — Telephone Encounter (Signed)
Attempted to call patient regarding upcoming cardiac CT appointment. °Left message on voicemail with name and callback number ° °Mayford Alberg RN Navigator Cardiac Imaging °[facility] °[phone] Office °[phone] Cell ° °

## 2020-08-11 ENCOUNTER — Ambulatory Visit (HOSPITAL_COMMUNITY)
Admission: RE | Admit: 2020-08-11 | Discharge: 2020-08-11 | Disposition: A | Discharge status: Home / Self Care | Payer: Medicare PPO | Source: Ambulatory Visit | Attending: Internal Medicine | Admitting: Internal Medicine

## 2020-08-11 ENCOUNTER — Other Ambulatory Visit: Payer: Self-pay

## 2020-08-11 DIAGNOSIS — I2510 Atherosclerotic heart disease of native coronary artery without angina pectoris: Secondary | ICD-10-CM | POA: Diagnosis not present

## 2020-08-11 DIAGNOSIS — I700 Atherosclerosis of aorta: Secondary | ICD-10-CM | POA: Diagnosis not present

## 2020-08-11 DIAGNOSIS — R079 Chest pain, unspecified: Secondary | ICD-10-CM | POA: Insufficient documentation

## 2020-08-11 LAB — BASIC METABOLIC PANEL
Anion gap: 8 (ref 5–15)
BUN: 17 mg/dL (ref 8–23)
CO2: 27 mmol/L (ref 22–32)
Calcium: 9.7 mg/dL (ref 8.9–10.3)
Chloride: 100 mmol/L (ref 98–111)
Creatinine, Ser: 1.25 mg/dL — ABNORMAL HIGH (ref 0.44–1.00)
GFR, Estimated: 45 mL/min — ABNORMAL LOW (ref >60)
Glucose, Bld: 113 mg/dL — ABNORMAL HIGH (ref 70–99)
Potassium: 3.7 mmol/L (ref 3.5–5.1)
Sodium: 135 mmol/L (ref 135–145)

## 2020-08-11 MED ORDER — SODIUM CHLORIDE 0.9 % WEIGHT BASED INFUSION: 1.0000 mL/kg/h | INTRAVENOUS | Status: DC

## 2020-08-11 MED ORDER — NITROGLYCERIN 0.4 MG SL SUBL
SUBLINGUAL_TABLET | SUBLINGUAL | Status: AC
  Filled 2020-08-11: qty 2

## 2020-08-11 MED ORDER — IOHEXOL 350 MG/ML SOLN
95.0000 mL | Freq: Once | INTRAVENOUS | Status: AC | PRN
  Administered 2020-08-11: 95 mL via INTRAVENOUS

## 2020-08-11 MED ORDER — SODIUM CHLORIDE 0.9 % WEIGHT BASED INFUSION
3.0000 mL/kg/h | INTRAVENOUS | Status: DC
  Administered 2020-08-11: 3 mL/kg/h via INTRAVENOUS

## 2020-08-11 NOTE — Progress Notes (Signed)
Bobby in CT notified that first hour of IVF would be completed at 1315 and pt would be ready for CT at that time

## 2020-08-15 ENCOUNTER — Other Ambulatory Visit: Payer: Self-pay | Admitting: *Deleted

## 2020-08-15 DIAGNOSIS — Z8249 Family history of ischemic heart disease and other diseases of the circulatory system: Secondary | ICD-10-CM

## 2020-08-15 DIAGNOSIS — E119 Type 2 diabetes mellitus without complications: Secondary | ICD-10-CM

## 2020-08-15 DIAGNOSIS — I10 Essential (primary) hypertension: Secondary | ICD-10-CM

## 2020-08-21 DIAGNOSIS — E1142 Type 2 diabetes mellitus with diabetic polyneuropathy: Secondary | ICD-10-CM | POA: Diagnosis not present

## 2020-08-21 DIAGNOSIS — F419 Anxiety disorder, unspecified: Secondary | ICD-10-CM | POA: Diagnosis not present

## 2020-08-21 DIAGNOSIS — G8929 Other chronic pain: Secondary | ICD-10-CM | POA: Diagnosis not present

## 2020-08-21 DIAGNOSIS — I10 Essential (primary) hypertension: Secondary | ICD-10-CM | POA: Diagnosis not present

## 2020-08-21 DIAGNOSIS — K219 Gastro-esophageal reflux disease without esophagitis: Secondary | ICD-10-CM | POA: Diagnosis not present

## 2020-08-21 DIAGNOSIS — E785 Hyperlipidemia, unspecified: Secondary | ICD-10-CM | POA: Diagnosis not present

## 2020-08-21 DIAGNOSIS — I499 Cardiac arrhythmia, unspecified: Secondary | ICD-10-CM | POA: Diagnosis not present

## 2020-08-21 DIAGNOSIS — L089 Local infection of the skin and subcutaneous tissue, unspecified: Secondary | ICD-10-CM | POA: Diagnosis not present

## 2020-08-28 ENCOUNTER — Other Ambulatory Visit: Payer: Self-pay

## 2020-08-28 ENCOUNTER — Encounter: Payer: Self-pay | Admitting: Internal Medicine

## 2020-08-28 ENCOUNTER — Ambulatory Visit: Payer: Medicare PPO | Admitting: Internal Medicine

## 2020-08-28 VITALS — BP 127/73 | HR 87 | Ht 62.0 in | Wt 199.8 lb

## 2020-08-28 DIAGNOSIS — R5383 Other fatigue: Secondary | ICD-10-CM | POA: Diagnosis not present

## 2020-08-28 DIAGNOSIS — E119 Type 2 diabetes mellitus without complications: Secondary | ICD-10-CM

## 2020-08-28 DIAGNOSIS — E785 Hyperlipidemia, unspecified: Secondary | ICD-10-CM | POA: Diagnosis not present

## 2020-08-28 NOTE — Patient Instructions (Signed)
Medication Instructions:  Continue current medication  *If you need a refill on your cardiac medications before your next appointment, please call your pharmacy*   Lab Work: Lipoprotein (a) and NMR in 3 Months  If you have labs (blood work) drawn today and your tests are completely normal, you will receive your results only by: Marland Kitchen MyChart Message (if you have MyChart) OR . A paper copy in the mail If you have any lab test that is abnormal or we need to change your treatment, we will call you to review the results.   Testing/Procedures: Your physician has recommended that you have a home sleep study. This test records several body functions during sleep, including: brain activity, eye movement, oxygen and carbon dioxide blood levels, heart rate and rhythm, breathing rate and rhythm, the flow of air through your mouth and nose, snoring, body muscle movements, and chest and belly movement.   Follow-Up: At [facility], you and your health needs are our priority.  As part of our continuing mission to provide you with exceptional heart care, we have created designated Provider Care Teams.  These Care Teams include your primary Cardiologist (physician) and Advanced Practice Providers (APPs -  Physician Assistants and Nurse Practitioners) who all work together to provide you with the care you need, when you need it.  We recommend signing up for the patient portal called "MyChart".  Sign up information is provided on this After Visit Summary.  MyChart is used to connect with patients for Virtual Visits (Telemedicine).  Patients are able to view lab/test results, encounter notes, upcoming appointments, etc.  Non-urgent messages can be sent to your provider as well.   To learn more about what you can do with MyChart, go to [email or web address].    Your next appointment:   6 month(s)  The format for your next appointment:   In Person  Provider:   You may see Chrystie Nose, MD or one of the  following Advanced Practice Providers on your designated Care Team:    Azalee Course, PA-C  Micah Flesher, PA-C or   Judy Pimple, [place]

## 2020-08-28 NOTE — Progress Notes (Signed)
OFFICE CONSULT NOTE  Chief Complaint:  Follow-up chest pain  Primary Care Physician: [facility]  HPI:  Vanessa Frank is a 75 y.o. female who is being seen today for the evaluation of chest pain at the request of Pa, [facility]*.  This is a pleasant 75 year old female kindly referred for evaluation management of chest pain.  She was just seen in the ER on March 19 for a chest pain equivalent.  She actually describes it as chest pressure but more across her back.  The symptoms were fairly constant.  She does say somewhat worse with exertion and.  She does have a history of fibromyalgia and chronic pain as well as type 2 diabetes and hypertension and there is family history of heart disease in her mother who had angina.  She apparently had work-up for similar symptoms about 5 or 6 years ago at [facility] cardiology.  After some discussion we determined she saw Dr. Carolanne Grumbling and had stress testing.  Those results are not in the epic charting system.  Apparently everything was negative.  Rule out for MI.  She had an EKG which showed septal infarct however this just indicates Q waves in V1 and V2 but I explained to them that she would need involvement of lead V3 for true septal infarct and I doubt that she has had prior infarct.  The pain across her back is somewhat concerning for possibly aortic pathology although blood pressure seems to be well controlled.  She did have a chest x-ray which showed no evidence of widened mediastinum or any cardiopulmonary disease.  A CT was not performed.  Troponin was negative x2.  D-dimer was also negative  08/28/2020  Vanessa Frank returns today for follow-up.  She underwent a coronary CT angiogram which showed a calcium score 351, 80th percentile for age and sex matched controls.  She did was noted to have some minimal coronary artery disease in the left main, ramus and RCA vessels.  Overall this does not describe a reason for her chest  pain.  Maximal medical therapy was recommended.  She does have good blood pressure control.  Recent lipids in April showed total cholesterol 166, HDL 83, LDL 61 and triglycerides 131 on low-dose atorvastatin 10 mg nightly.  She does report significant fatigue.  We talked about possible options including a sleep disorder.  She had an Epworth Sleepiness Scale performed which scored a 9.  She reports some nonrestorative sleep at night as well as snoring.  PMHx:  Past Medical History:  Diagnosis Date  . Anxiety   . Arthritis   . Diabetes mellitus ORAL MEDS ONLY  . Dysrhythmia IRREGULAR  RATE IN HER 20'S  . Fibromyalgia   . GERD (gastroesophageal reflux disease)   . Headache(784.0)   . Hypertension   . Neuropathy    TOES  . Palpitations    OCCASIONAL  . Shortness of breath WITH EXERTION  . Stress incontinence     Past Surgical History:  Procedure Laterality Date  . ABDOMINAL HYSTERECTOMY  80'S   Partial   . ANTERIOR LAT LUMBAR FUSION  03/26/2011   Procedure: ANTERIOR LATERAL LUMBAR FUSION 1 LEVEL;  Surgeon: Tia Alert;  Location: [facility];  Service: Neurosurgery;  Laterality: Left;  Left Lumbar three-four anterolateral retroperitoneal interbody fusion   . ARTHROSCOPY KNEE W/ DRILLING  09 LEFT   2012 RIGHT  . BARTHOLIN GLAND CYST EXCISION  1983  . CHOLECYSTECTOMY  1989  .  CYSTOSCOPY  80'S  . FOOT SURGERY Right   . SPINAL FUSION  2012   Lower Back  . TOTAL KNEE ARTHROPLASTY Left 05/28/2013   Procedure: LEFT TOTAL KNEE ARTHROPLASTY;  Surgeon: Loanne Drilling, MD;  Location: [facility];  Service: Orthopedics;  Laterality: Left;  . TOTAL KNEE ARTHROPLASTY Right 10/29/2013   Procedure: RIGHT TOTAL KNEE ARTHROPLASTY;  Surgeon: Loanne Drilling, MD;  Location: [facility];  Service: Orthopedics;  Laterality: Right;    FAMHx:  Family History  Problem Relation Age of Onset  . Depression Mother   . Congestive Heart Failure Mother   . Allergy (severe) Mother   . Heart disease Mother   .  Alcoholism Mother   . Hypertension Mother   . Osteoarthritis Mother   . Congestive Heart Failure Father   . Cancer Father   . Depression Maternal Grandmother   . Osteoarthritis Maternal Grandmother   . Depression Sister   . Rheum arthritis Paternal Grandmother   . Osteoarthritis Paternal Grandmother   . Osteoarthritis Sister   . Clotting disorder Sister     SOCHx:   reports that she has never smoked. She has never used smokeless tobacco. She reports that she does not drink alcohol and does not use drugs.  ALLERGIES:  Allergies  Allergen Reactions  . Adhesive [Tape] Itching  . Codeine Nausea Only  . Latex Itching  . Chocolate Rash    ROS: Pertinent items noted in HPI and remainder of comprehensive ROS otherwise negative.  HOME MEDS: Current Outpatient Medications on File Prior to Visit  Medication Sig Dispense Refill  . atorvastatin (LIPITOR) 10 MG tablet Take 10 mg by mouth at bedtime.    . B Complex Vitamins (VITAMIN B COMPLEX) TABS Take 1 tablet by mouth daily.    . carboxymethylcellulose (REFRESH PLUS) 0.5 % SOLN Place 1 drop into both eyes 3 (three) times daily as needed (dry eyes).    Marland Kitchen ELDERBERRY PO Take 1 capsule by mouth daily.    . furosemide (LASIX) 40 MG tablet Take 40 mg by mouth once as needed.    Marland Kitchen lisinopril (PRINIVIL,ZESTRIL) 10 MG tablet Take 10 mg by mouth every morning.    . melatonin 5 MG TABS Take 5 mg by mouth at bedtime.    . metFORMIN (GLUCOPHAGE) 1000 MG tablet Take 1,000 mg by mouth 2 (two) times daily with a meal.    . Multiple Vitamin (MULTIVITAMIN) capsule Take 1 capsule by mouth daily.    Marland Kitchen oxyCODONE-acetaminophen (PERCOCET) 10-325 MG tablet Take 1-2 tablets by mouth daily as needed for pain.  0  . pioglitazone (ACTOS) 30 MG tablet TAKE 1 TABLET BY MOUTH EVERY MORNING (Patient taking differently: Take 30 mg by mouth daily.) 90 tablet 0  . sertraline (ZOLOFT) 100 MG tablet Take 100 mg by mouth every morning.     No current  facility-administered medications on file prior to visit.    LABS/IMAGING: No results found for this or any previous visit (from the past 48 hour(s)). No results found.  LIPID PANEL: No results found for: CHOL, TRIG, HDL, CHOLHDL, VLDL, LDLCALC, LDLDIRECT  WEIGHTS: Wt Readings from Last 3 Encounters:  08/28/20 199 lb 12.8 oz (90.6 kg)  08/11/20 203 lb (92.1 kg)  07/15/20 206 lb (93.4 kg)    VITALS: BP 127/73   Pulse 87   Ht 5\' 2"  (1.575 m)   Wt 199 lb 12.8 oz (90.6 kg)   SpO2 98%   BMI 36.54 kg/m   EXAM: Deferred  EKG: Deferred  ASSESSMENT: 1. Chest and upper back pain -mild nonobstructive coronary disease by cardiac CT, calcium score 351, 80th percentile (07/2020) 2. Type 2 diabetes 3. Hypertension 4. Dyslipidemia 5. Family history of CAD in mom 6. Fatigue/nonrestorative sleep  PLAN: 1.   Vanessa Frank had a low risk coronary CT however does have a higher calcium score than predicted for age.  Although she has multiple risk factors in general they appear to be well controlled.  She is not on a very high dose statin but has an LDL at target less than 70.  I would like to repeat a lipid NMR and LP(a) with blood work in about 3 months to see if there are any possible other reasons why her coronary disease seems to be age advanced.  She also reports nonrestorative sleep and fatigue.  I like for her to undergo a sleep study.  Her EPWSS was 9 but she has multiple risk factors and loud snoring as well as nonrestorative sleep.  We will order home sleep study.  Follow-up with me afterwards  Chrystie Nose, MD, [facility]  [place]  [facility]  Medical Director of the Advanced Lipid Disorders &  Cardiovascular Risk Reduction Clinic Diplomate of the American Board of Clinical Lipidology Attending Cardiologist  Direct Dial: [phone]  Fax: [phone]  Website:  [email or web address] 08/28/2020, 3:32 PM

## 2020-09-01 ENCOUNTER — Telehealth: Payer: Self-pay | Admitting: *Deleted

## 2020-09-01 NOTE — Telephone Encounter (Signed)
-----   Message from Barrie Dunker sent at 08/28/2020  3:58 PM EDT ----- Pt needs home sleep study

## 2020-09-01 NOTE — Telephone Encounter (Signed)
Left HST appointment details on voicemail. Contact phone number to the sleep lab if she needs to rescheduled.

## 2020-10-03 ENCOUNTER — Other Ambulatory Visit: Payer: Self-pay

## 2020-10-03 ENCOUNTER — Ambulatory Visit (HOSPITAL_BASED_OUTPATIENT_CLINIC_OR_DEPARTMENT_OTHER): Payer: Medicare PPO | Attending: Internal Medicine | Admitting: Cardiovascular Disease

## 2020-10-03 DIAGNOSIS — R5383 Other fatigue: Secondary | ICD-10-CM

## 2020-10-27 DIAGNOSIS — M961 Postlaminectomy syndrome, not elsewhere classified: Secondary | ICD-10-CM | POA: Diagnosis not present

## 2020-10-27 DIAGNOSIS — M461 Sacroiliitis, not elsewhere classified: Secondary | ICD-10-CM | POA: Diagnosis not present

## 2020-10-27 DIAGNOSIS — F1120 Opioid dependence, uncomplicated: Secondary | ICD-10-CM | POA: Diagnosis not present

## 2020-10-27 DIAGNOSIS — Z79899 Other long term (current) drug therapy: Secondary | ICD-10-CM | POA: Diagnosis not present

## 2021-01-22 DIAGNOSIS — F419 Anxiety disorder, unspecified: Secondary | ICD-10-CM | POA: Diagnosis not present

## 2021-01-22 DIAGNOSIS — Z79899 Other long term (current) drug therapy: Secondary | ICD-10-CM | POA: Diagnosis not present

## 2021-01-22 DIAGNOSIS — M797 Fibromyalgia: Secondary | ICD-10-CM | POA: Diagnosis not present

## 2021-01-22 DIAGNOSIS — F1120 Opioid dependence, uncomplicated: Secondary | ICD-10-CM | POA: Diagnosis not present

## 2021-02-10 DIAGNOSIS — Z1231 Encounter for screening mammogram for malignant neoplasm of breast: Secondary | ICD-10-CM | POA: Diagnosis not present

## 2021-02-10 DIAGNOSIS — R1314 Dysphagia, pharyngoesophageal phase: Secondary | ICD-10-CM | POA: Diagnosis not present

## 2021-02-10 DIAGNOSIS — E1169 Type 2 diabetes mellitus with other specified complication: Secondary | ICD-10-CM | POA: Diagnosis not present

## 2021-02-10 DIAGNOSIS — E1122 Type 2 diabetes mellitus with diabetic chronic kidney disease: Secondary | ICD-10-CM | POA: Diagnosis not present

## 2021-02-10 DIAGNOSIS — Z23 Encounter for immunization: Secondary | ICD-10-CM | POA: Diagnosis not present

## 2021-02-10 DIAGNOSIS — I10 Essential (primary) hypertension: Secondary | ICD-10-CM | POA: Diagnosis not present

## 2021-02-10 DIAGNOSIS — Z7984 Long term (current) use of oral hypoglycemic drugs: Secondary | ICD-10-CM | POA: Diagnosis not present

## 2021-04-21 DIAGNOSIS — F1120 Opioid dependence, uncomplicated: Secondary | ICD-10-CM | POA: Diagnosis not present

## 2021-04-21 DIAGNOSIS — Z79899 Other long term (current) drug therapy: Secondary | ICD-10-CM | POA: Diagnosis not present

## 2021-04-21 DIAGNOSIS — M961 Postlaminectomy syndrome, not elsewhere classified: Secondary | ICD-10-CM | POA: Diagnosis not present

## 2021-04-21 DIAGNOSIS — M461 Sacroiliitis, not elsewhere classified: Secondary | ICD-10-CM | POA: Diagnosis not present

## 2021-05-01 DIAGNOSIS — E1169 Type 2 diabetes mellitus with other specified complication: Secondary | ICD-10-CM | POA: Diagnosis not present

## 2021-05-01 DIAGNOSIS — M47817 Spondylosis without myelopathy or radiculopathy, lumbosacral region: Secondary | ICD-10-CM | POA: Diagnosis not present

## 2021-05-01 DIAGNOSIS — M791 Myalgia, unspecified site: Secondary | ICD-10-CM | POA: Diagnosis not present

## 2021-05-01 DIAGNOSIS — E538 Deficiency of other specified B group vitamins: Secondary | ICD-10-CM | POA: Diagnosis not present

## 2021-05-01 DIAGNOSIS — M79641 Pain in right hand: Secondary | ICD-10-CM | POA: Diagnosis not present

## 2021-05-01 DIAGNOSIS — N1832 Chronic kidney disease, stage 3b: Secondary | ICD-10-CM | POA: Diagnosis not present

## 2021-05-01 DIAGNOSIS — E669 Obesity, unspecified: Secondary | ICD-10-CM | POA: Diagnosis not present

## 2021-05-01 DIAGNOSIS — R635 Abnormal weight gain: Secondary | ICD-10-CM | POA: Diagnosis not present

## 2021-05-01 DIAGNOSIS — G894 Chronic pain syndrome: Secondary | ICD-10-CM | POA: Diagnosis not present

## 2021-05-01 DIAGNOSIS — M79642 Pain in left hand: Secondary | ICD-10-CM | POA: Diagnosis not present

## 2021-05-04 DIAGNOSIS — G8929 Other chronic pain: Secondary | ICD-10-CM | POA: Diagnosis not present

## 2021-05-04 DIAGNOSIS — E1122 Type 2 diabetes mellitus with diabetic chronic kidney disease: Secondary | ICD-10-CM | POA: Diagnosis not present

## 2021-05-04 DIAGNOSIS — E785 Hyperlipidemia, unspecified: Secondary | ICD-10-CM | POA: Diagnosis not present

## 2021-05-04 DIAGNOSIS — F324 Major depressive disorder, single episode, in partial remission: Secondary | ICD-10-CM | POA: Diagnosis not present

## 2021-05-04 DIAGNOSIS — I129 Hypertensive chronic kidney disease with stage 1 through stage 4 chronic kidney disease, or unspecified chronic kidney disease: Secondary | ICD-10-CM | POA: Diagnosis not present

## 2021-05-04 DIAGNOSIS — E1142 Type 2 diabetes mellitus with diabetic polyneuropathy: Secondary | ICD-10-CM | POA: Diagnosis not present

## 2021-05-04 DIAGNOSIS — G4700 Insomnia, unspecified: Secondary | ICD-10-CM | POA: Diagnosis not present

## 2021-05-04 DIAGNOSIS — F419 Anxiety disorder, unspecified: Secondary | ICD-10-CM | POA: Diagnosis not present

## 2021-06-04 DIAGNOSIS — M549 Dorsalgia, unspecified: Secondary | ICD-10-CM | POA: Diagnosis not present

## 2021-06-04 DIAGNOSIS — M79672 Pain in left foot: Secondary | ICD-10-CM | POA: Diagnosis not present

## 2021-06-04 DIAGNOSIS — M79671 Pain in right foot: Secondary | ICD-10-CM | POA: Diagnosis not present

## 2021-06-04 DIAGNOSIS — M2550 Pain in unspecified joint: Secondary | ICD-10-CM | POA: Diagnosis not present

## 2021-06-04 DIAGNOSIS — M79641 Pain in right hand: Secondary | ICD-10-CM | POA: Diagnosis not present

## 2021-06-04 DIAGNOSIS — M79643 Pain in unspecified hand: Secondary | ICD-10-CM | POA: Diagnosis not present

## 2021-06-04 DIAGNOSIS — M797 Fibromyalgia: Secondary | ICD-10-CM | POA: Diagnosis not present

## 2021-06-04 DIAGNOSIS — M79642 Pain in left hand: Secondary | ICD-10-CM | POA: Diagnosis not present

## 2021-06-04 DIAGNOSIS — M1990 Unspecified osteoarthritis, unspecified site: Secondary | ICD-10-CM | POA: Diagnosis not present

## 2021-07-13 ENCOUNTER — Other Ambulatory Visit: Payer: Self-pay

## 2021-07-13 ENCOUNTER — Emergency Department (HOSPITAL_BASED_OUTPATIENT_CLINIC_OR_DEPARTMENT_OTHER)
Admission: EM | Admit: 2021-07-13 | Discharge: 2021-07-13 | Disposition: A | Discharge status: Home / Self Care | Payer: Medicare PPO | Attending: Emergency Medicine | Admitting: Emergency Medicine

## 2021-07-13 ENCOUNTER — Encounter (HOSPITAL_BASED_OUTPATIENT_CLINIC_OR_DEPARTMENT_OTHER): Payer: Self-pay | Admitting: Emergency Medicine

## 2021-07-13 ENCOUNTER — Emergency Department (HOSPITAL_BASED_OUTPATIENT_CLINIC_OR_DEPARTMENT_OTHER): Payer: Medicare PPO

## 2021-07-13 DIAGNOSIS — M47812 Spondylosis without myelopathy or radiculopathy, cervical region: Secondary | ICD-10-CM | POA: Diagnosis not present

## 2021-07-13 DIAGNOSIS — S0083XA Contusion of other part of head, initial encounter: Secondary | ICD-10-CM | POA: Insufficient documentation

## 2021-07-13 DIAGNOSIS — Y9389 Activity, other specified: Secondary | ICD-10-CM | POA: Insufficient documentation

## 2021-07-13 DIAGNOSIS — R531 Weakness: Secondary | ICD-10-CM | POA: Diagnosis not present

## 2021-07-13 DIAGNOSIS — R42 Dizziness and giddiness: Secondary | ICD-10-CM | POA: Diagnosis not present

## 2021-07-13 DIAGNOSIS — Z91040 Latex allergy status: Secondary | ICD-10-CM | POA: Insufficient documentation

## 2021-07-13 DIAGNOSIS — M4312 Spondylolisthesis, cervical region: Secondary | ICD-10-CM | POA: Diagnosis not present

## 2021-07-13 DIAGNOSIS — W19XXXA Unspecified fall, initial encounter: Secondary | ICD-10-CM | POA: Diagnosis not present

## 2021-07-13 DIAGNOSIS — Y999 Unspecified external cause status: Secondary | ICD-10-CM | POA: Diagnosis not present

## 2021-07-13 DIAGNOSIS — S0093XA Contusion of unspecified part of head, initial encounter: Secondary | ICD-10-CM

## 2021-07-13 DIAGNOSIS — Y9289 Other specified places as the place of occurrence of the external cause: Secondary | ICD-10-CM | POA: Insufficient documentation

## 2021-07-13 DIAGNOSIS — I10 Essential (primary) hypertension: Secondary | ICD-10-CM | POA: Diagnosis not present

## 2021-07-13 DIAGNOSIS — M542 Cervicalgia: Secondary | ICD-10-CM | POA: Diagnosis not present

## 2021-07-13 DIAGNOSIS — I6381 Other cerebral infarction due to occlusion or stenosis of small artery: Secondary | ICD-10-CM | POA: Diagnosis not present

## 2021-07-13 DIAGNOSIS — M2578 Osteophyte, vertebrae: Secondary | ICD-10-CM | POA: Diagnosis not present

## 2021-07-13 DIAGNOSIS — S0990XA Unspecified injury of head, initial encounter: Secondary | ICD-10-CM | POA: Diagnosis present

## 2021-07-13 DIAGNOSIS — R519 Headache, unspecified: Secondary | ICD-10-CM | POA: Diagnosis not present

## 2021-07-13 MED ORDER — ACETAMINOPHEN 325 MG PO TABS
650.0000 mg | ORAL_TABLET | Freq: Four times a day (QID) | ORAL | Status: DC | PRN
  Administered 2021-07-13: 650 mg via ORAL
  Filled 2021-07-13: qty 2

## 2021-07-13 NOTE — ED Notes (Signed)
C-collar removed by Provider.  Pt demonstrates appropriate ROM of head and neck on discharge ?

## 2021-07-13 NOTE — Discharge Instructions (Addendum)
Return if any problems.

## 2021-07-13 NOTE — ED Triage Notes (Signed)
Pt reports falling on Wednesday and hitting the back of her head. Pt now reports dizziness, nausea, and lethargic. Per family at bedside pt has been in bed since the fall on Wednesday. GCS 15. ?

## 2021-07-13 NOTE — ED Notes (Signed)
Patient transported to CT 

## 2021-07-16 NOTE — ED Provider Notes (Signed)
?[facility] EMERGENCY DEPARTMENT ?Provider Note ? ? ?CSN: [id number] ?Arrival date & time: 07/13/21  0847 ? ?  ? ?History ? ?Chief Complaint  ?Patient presents with  ? Fall  ? ? ?Vanessa Frank is a 76 y.o. female. ? ?Pt reports she fell and hit the back of her head.  Pt reports feeling weak and having pain in her head.  Pt denies any loss of consciousness  ? ?The history is provided by the patient. No language interpreter was used.  ?Fall ?This is a new problem. The problem occurs constantly. The problem has not changed since onset.Associated symptoms include headaches. Nothing aggravates the symptoms. Nothing relieves the symptoms. She has tried nothing for the symptoms. The treatment provided no relief.  ? ?  ? ?Home Medications ?Prior to Admission medications   ?Medication Sig Start Date End Date Taking? Authorizing Provider  ?atorvastatin (LIPITOR) 10 MG tablet Take 10 mg by mouth at bedtime.    [provider]  ?B Complex Vitamins (VITAMIN B COMPLEX) TABS Take 1 tablet by mouth daily.    [provider]  ?carboxymethylcellulose (REFRESH PLUS) 0.5 % SOLN Place 1 drop into both eyes 3 (three) times daily as needed (dry eyes).    [provider]  ?ELDERBERRY PO Take 1 capsule by mouth daily.    [provider]  ?furosemide (LASIX) 40 MG tablet Take 40 mg by mouth once as needed.    [provider]  ?lisinopril (PRINIVIL,ZESTRIL) 10 MG tablet Take 10 mg by mouth every morning.    [provider]  ?melatonin 5 MG TABS Take 5 mg by mouth at bedtime.    [provider]  ?metFORMIN (GLUCOPHAGE) 1000 MG tablet Take 1,000 mg by mouth 2 (two) times daily with a meal.    [provider]  ?Multiple Vitamin (MULTIVITAMIN) capsule Take 1 capsule by mouth daily.    [provider]  ?oxyCODONE-acetaminophen (PERCOCET) 10-325 MG tablet Take 1-2 tablets by mouth daily as needed for pain. 06/15/17   [provider]  ?pioglitazone  (ACTOS) 30 MG tablet TAKE 1 TABLET BY MOUTH EVERY MORNING ?Patient taking differently: Take 30 mg by mouth daily. 03/18/14   [name], [name], DO  ?sertraline (ZOLOFT) 100 MG tablet Take 100 mg by mouth every morning.    [provider]  ?   ? ?Allergies    ?Adhesive [tape], Codeine, Latex, and Chocolate   ? ?Review of Systems   ?Review of Systems  ?Neurological:  Positive for headaches.  ?All other systems reviewed and are negative. ? ?Physical Exam ?Updated Vital Signs ?BP 132/78 (BP Location: Right Arm)   Pulse 79   Temp 98.5 ?F (36.9 ?C) (Oral)   Resp 14   SpO2 97%  ?Physical Exam ?Vitals and nursing note reviewed.  ?Constitutional:   ?   Appearance: She is well-developed.  ?HENT:  ?   Head: Normocephalic.  ?   Right Ear: External ear normal.  ?   Left Ear: External ear normal.  ?   Mouth/Throat:  ?   Mouth: Mucous membranes are moist.  ?Eyes:  ?   Extraocular Movements: Extraocular movements intact.  ?   Pupils: Pupils are equal, round, and reactive to light.  ?Cardiovascular:  ?   Rate and Rhythm: Normal rate.  ?Pulmonary:  ?   Effort: Pulmonary effort is normal.  ?Abdominal:  ?   General: Abdomen is flat. There is no distension.  ?Musculoskeletal:     ?  General: Normal range of motion.  ?   Cervical back: Normal range of motion.  ?Neurological:  ?   General: No focal deficit present.  ?   Mental Status: She is alert and oriented to person, place, and time.  ? ? ?ED Results / Procedures / Treatments   ?Labs ?(all labs ordered are listed, but only abnormal results are displayed) ?Labs Reviewed - No data to display ? ?EKG ?EKG Interpretation ? ?Date/Time:  Monday July 13 2021 09:01:42 EDT ?Ventricular Rate:  88 ?PR Interval:  130 ?QRS Duration: 86 ?QT Interval:  342 ?QTC Calculation: 414 ?R Axis:   21 ?Text Interpretation: Sinus rhythm Low voltage, precordial leads No significant change since Confirmed by Linwood Dibbles [id number]) on 07/13/2021 9:08:05 AM ? ?Radiology ?No results  found. ? ?Procedures ?Procedures  ? ? ?Medications Ordered in ED ?Medications - No data to display ? ?ED Course/ Medical Decision Making/ A&P ?  ?                        ?Medical Decision Making ?Pt complains of falling and hitting her head  ? ?Amount and/or Complexity of Data Reviewed ?External Data Reviewed: notes. ?   Details: Cardiology notes reviewed ?Radiology: ordered and independent interpretation performed. Decision-making details documented in ED Course. ?   Details: Ct head and ct c spine  no acute traumatic injuuies ?ECG/medicine tests: ordered and independent interpretation performed. Decision-making details documented in ED Course. ? ? ? ? ? ? ? ? ? ? ?Final Clinical Impression(s) / ED Diagnoses ?Final diagnoses:  ?Contusion of head, unspecified part of head, initial encounter  ? ? ?Rx / DC Orders ?ED Discharge Orders   ? ? None  ? ?  ?An After Visit Summary was printed and given to the patient.  ? ?  ?Elson Areas, [place] ?07/16/21 0940 ? ?  ?Linwood Dibbles, MD ?07/17/21 6045 ? ?

## 2021-07-29 DIAGNOSIS — G894 Chronic pain syndrome: Secondary | ICD-10-CM | POA: Diagnosis not present

## 2021-07-29 DIAGNOSIS — M47817 Spondylosis without myelopathy or radiculopathy, lumbosacral region: Secondary | ICD-10-CM | POA: Diagnosis not present

## 2021-07-29 DIAGNOSIS — J0110 Acute frontal sinusitis, unspecified: Secondary | ICD-10-CM | POA: Diagnosis not present

## 2021-07-29 DIAGNOSIS — G44209 Tension-type headache, unspecified, not intractable: Secondary | ICD-10-CM | POA: Diagnosis not present

## 2021-07-29 DIAGNOSIS — E1169 Type 2 diabetes mellitus with other specified complication: Secondary | ICD-10-CM | POA: Diagnosis not present

## 2021-07-29 DIAGNOSIS — R252 Cramp and spasm: Secondary | ICD-10-CM | POA: Diagnosis not present

## 2021-07-30 DIAGNOSIS — N1831 Chronic kidney disease, stage 3a: Secondary | ICD-10-CM | POA: Diagnosis not present

## 2021-07-30 DIAGNOSIS — R82998 Other abnormal findings in urine: Secondary | ICD-10-CM | POA: Diagnosis not present

## 2021-07-30 DIAGNOSIS — E875 Hyperkalemia: Secondary | ICD-10-CM | POA: Diagnosis not present

## 2021-07-30 DIAGNOSIS — N390 Urinary tract infection, site not specified: Secondary | ICD-10-CM | POA: Diagnosis not present

## 2021-07-30 DIAGNOSIS — E1122 Type 2 diabetes mellitus with diabetic chronic kidney disease: Secondary | ICD-10-CM | POA: Diagnosis not present

## 2021-07-30 DIAGNOSIS — N1832 Chronic kidney disease, stage 3b: Secondary | ICD-10-CM | POA: Diagnosis not present

## 2021-07-30 DIAGNOSIS — I129 Hypertensive chronic kidney disease with stage 1 through stage 4 chronic kidney disease, or unspecified chronic kidney disease: Secondary | ICD-10-CM | POA: Diagnosis not present

## 2021-08-06 ENCOUNTER — Other Ambulatory Visit: Payer: Self-pay | Admitting: Nephrology

## 2021-08-06 DIAGNOSIS — E875 Hyperkalemia: Secondary | ICD-10-CM

## 2021-08-06 DIAGNOSIS — I129 Hypertensive chronic kidney disease with stage 1 through stage 4 chronic kidney disease, or unspecified chronic kidney disease: Secondary | ICD-10-CM

## 2021-08-06 DIAGNOSIS — E1122 Type 2 diabetes mellitus with diabetic chronic kidney disease: Secondary | ICD-10-CM

## 2021-08-06 DIAGNOSIS — R82998 Other abnormal findings in urine: Secondary | ICD-10-CM

## 2021-08-06 DIAGNOSIS — N1832 Chronic kidney disease, stage 3b: Secondary | ICD-10-CM

## 2021-08-13 ENCOUNTER — Ambulatory Visit
Admission: RE | Admit: 2021-08-13 | Discharge: 2021-08-13 | Disposition: A | Discharge status: Home / Self Care | Payer: Medicare PPO | Source: Ambulatory Visit | Attending: Nephrology | Admitting: Nephrology

## 2021-08-13 DIAGNOSIS — I129 Hypertensive chronic kidney disease with stage 1 through stage 4 chronic kidney disease, or unspecified chronic kidney disease: Secondary | ICD-10-CM

## 2021-08-13 DIAGNOSIS — E1122 Type 2 diabetes mellitus with diabetic chronic kidney disease: Secondary | ICD-10-CM

## 2021-08-13 DIAGNOSIS — N183 Chronic kidney disease, stage 3 unspecified: Secondary | ICD-10-CM | POA: Diagnosis not present

## 2021-08-13 DIAGNOSIS — E875 Hyperkalemia: Secondary | ICD-10-CM

## 2021-08-13 DIAGNOSIS — R82998 Other abnormal findings in urine: Secondary | ICD-10-CM

## 2021-08-13 DIAGNOSIS — N1832 Chronic kidney disease, stage 3b: Secondary | ICD-10-CM

## 2021-08-17 DIAGNOSIS — K219 Gastro-esophageal reflux disease without esophagitis: Secondary | ICD-10-CM | POA: Diagnosis not present

## 2021-08-17 DIAGNOSIS — Z0000 Encounter for general adult medical examination without abnormal findings: Secondary | ICD-10-CM | POA: Diagnosis not present

## 2021-08-17 DIAGNOSIS — D369 Benign neoplasm, unspecified site: Secondary | ICD-10-CM | POA: Diagnosis not present

## 2021-08-17 DIAGNOSIS — N1832 Chronic kidney disease, stage 3b: Secondary | ICD-10-CM | POA: Diagnosis not present

## 2021-08-17 DIAGNOSIS — E1122 Type 2 diabetes mellitus with diabetic chronic kidney disease: Secondary | ICD-10-CM | POA: Diagnosis not present

## 2021-08-17 DIAGNOSIS — E1169 Type 2 diabetes mellitus with other specified complication: Secondary | ICD-10-CM | POA: Diagnosis not present

## 2021-08-17 DIAGNOSIS — H900 Conductive hearing loss, bilateral: Secondary | ICD-10-CM | POA: Diagnosis not present

## 2021-08-17 DIAGNOSIS — F330 Major depressive disorder, recurrent, mild: Secondary | ICD-10-CM | POA: Diagnosis not present

## 2021-08-17 DIAGNOSIS — I10 Essential (primary) hypertension: Secondary | ICD-10-CM | POA: Diagnosis not present

## 2021-08-17 DIAGNOSIS — Z1331 Encounter for screening for depression: Secondary | ICD-10-CM | POA: Diagnosis not present

## 2021-08-31 DIAGNOSIS — E1122 Type 2 diabetes mellitus with diabetic chronic kidney disease: Secondary | ICD-10-CM | POA: Diagnosis not present

## 2021-08-31 DIAGNOSIS — N3946 Mixed incontinence: Secondary | ICD-10-CM | POA: Diagnosis not present

## 2021-08-31 DIAGNOSIS — R200 Anesthesia of skin: Secondary | ICD-10-CM | POA: Diagnosis not present

## 2021-08-31 DIAGNOSIS — R32 Unspecified urinary incontinence: Secondary | ICD-10-CM | POA: Diagnosis not present

## 2021-09-03 DIAGNOSIS — H903 Sensorineural hearing loss, bilateral: Secondary | ICD-10-CM | POA: Diagnosis not present

## 2021-09-10 DIAGNOSIS — R944 Abnormal results of kidney function studies: Secondary | ICD-10-CM | POA: Diagnosis not present

## 2021-09-10 DIAGNOSIS — Z79899 Other long term (current) drug therapy: Secondary | ICD-10-CM | POA: Diagnosis not present

## 2021-09-10 DIAGNOSIS — M797 Fibromyalgia: Secondary | ICD-10-CM | POA: Diagnosis not present

## 2021-09-10 DIAGNOSIS — M4690 Unspecified inflammatory spondylopathy, site unspecified: Secondary | ICD-10-CM | POA: Diagnosis not present

## 2021-09-10 DIAGNOSIS — M549 Dorsalgia, unspecified: Secondary | ICD-10-CM | POA: Diagnosis not present

## 2021-09-10 DIAGNOSIS — M1990 Unspecified osteoarthritis, unspecified site: Secondary | ICD-10-CM | POA: Diagnosis not present

## 2021-09-10 DIAGNOSIS — M2550 Pain in unspecified joint: Secondary | ICD-10-CM | POA: Diagnosis not present

## 2021-10-01 DIAGNOSIS — M797 Fibromyalgia: Secondary | ICD-10-CM | POA: Diagnosis not present

## 2021-10-01 DIAGNOSIS — M5416 Radiculopathy, lumbar region: Secondary | ICD-10-CM | POA: Diagnosis not present

## 2021-10-01 DIAGNOSIS — F1120 Opioid dependence, uncomplicated: Secondary | ICD-10-CM | POA: Diagnosis not present

## 2021-10-01 DIAGNOSIS — M461 Sacroiliitis, not elsewhere classified: Secondary | ICD-10-CM | POA: Diagnosis not present

## 2021-10-01 DIAGNOSIS — M48061 Spinal stenosis, lumbar region without neurogenic claudication: Secondary | ICD-10-CM | POA: Diagnosis not present

## 2021-11-13 DIAGNOSIS — J40 Bronchitis, not specified as acute or chronic: Secondary | ICD-10-CM | POA: Diagnosis not present

## 2021-11-13 DIAGNOSIS — R059 Cough, unspecified: Secondary | ICD-10-CM | POA: Diagnosis not present

## 2021-11-18 DIAGNOSIS — M961 Postlaminectomy syndrome, not elsewhere classified: Secondary | ICD-10-CM | POA: Diagnosis not present

## 2021-11-18 DIAGNOSIS — G894 Chronic pain syndrome: Secondary | ICD-10-CM | POA: Diagnosis not present

## 2021-11-18 DIAGNOSIS — Z6836 Body mass index (BMI) 36.0-36.9, adult: Secondary | ICD-10-CM | POA: Diagnosis not present

## 2021-12-04 DIAGNOSIS — Z79899 Other long term (current) drug therapy: Secondary | ICD-10-CM | POA: Diagnosis not present

## 2021-12-11 DIAGNOSIS — M2550 Pain in unspecified joint: Secondary | ICD-10-CM | POA: Diagnosis not present

## 2021-12-11 DIAGNOSIS — M79646 Pain in unspecified finger(s): Secondary | ICD-10-CM | POA: Diagnosis not present

## 2021-12-11 DIAGNOSIS — M1990 Unspecified osteoarthritis, unspecified site: Secondary | ICD-10-CM | POA: Diagnosis not present

## 2021-12-11 DIAGNOSIS — M79643 Pain in unspecified hand: Secondary | ICD-10-CM | POA: Diagnosis not present

## 2021-12-11 DIAGNOSIS — M4690 Unspecified inflammatory spondylopathy, site unspecified: Secondary | ICD-10-CM | POA: Diagnosis not present

## 2021-12-11 DIAGNOSIS — Z79899 Other long term (current) drug therapy: Secondary | ICD-10-CM | POA: Diagnosis not present

## 2021-12-11 DIAGNOSIS — M797 Fibromyalgia: Secondary | ICD-10-CM | POA: Diagnosis not present

## 2021-12-11 DIAGNOSIS — N1831 Chronic kidney disease, stage 3a: Secondary | ICD-10-CM | POA: Diagnosis not present

## 2021-12-11 DIAGNOSIS — M549 Dorsalgia, unspecified: Secondary | ICD-10-CM | POA: Diagnosis not present

## 2021-12-24 DIAGNOSIS — D1801 Hemangioma of skin and subcutaneous tissue: Secondary | ICD-10-CM | POA: Diagnosis not present

## 2021-12-24 DIAGNOSIS — L821 Other seborrheic keratosis: Secondary | ICD-10-CM | POA: Diagnosis not present

## 2021-12-24 DIAGNOSIS — L309 Dermatitis, unspecified: Secondary | ICD-10-CM | POA: Diagnosis not present

## 2021-12-24 DIAGNOSIS — L814 Other melanin hyperpigmentation: Secondary | ICD-10-CM | POA: Diagnosis not present

## 2021-12-24 DIAGNOSIS — L578 Other skin changes due to chronic exposure to nonionizing radiation: Secondary | ICD-10-CM | POA: Diagnosis not present

## 2022-02-03 DIAGNOSIS — M961 Postlaminectomy syndrome, not elsewhere classified: Secondary | ICD-10-CM | POA: Diagnosis not present

## 2022-02-03 DIAGNOSIS — M461 Sacroiliitis, not elsewhere classified: Secondary | ICD-10-CM | POA: Diagnosis not present

## 2022-02-03 DIAGNOSIS — G894 Chronic pain syndrome: Secondary | ICD-10-CM | POA: Diagnosis not present

## 2022-02-03 DIAGNOSIS — Z6836 Body mass index (BMI) 36.0-36.9, adult: Secondary | ICD-10-CM | POA: Diagnosis not present

## 2022-02-16 DIAGNOSIS — M2550 Pain in unspecified joint: Secondary | ICD-10-CM | POA: Diagnosis not present

## 2022-02-16 DIAGNOSIS — M1990 Unspecified osteoarthritis, unspecified site: Secondary | ICD-10-CM | POA: Diagnosis not present

## 2022-02-16 DIAGNOSIS — M549 Dorsalgia, unspecified: Secondary | ICD-10-CM | POA: Diagnosis not present

## 2022-02-16 DIAGNOSIS — M797 Fibromyalgia: Secondary | ICD-10-CM | POA: Diagnosis not present

## 2022-02-16 DIAGNOSIS — N1831 Chronic kidney disease, stage 3a: Secondary | ICD-10-CM | POA: Diagnosis not present

## 2022-02-16 DIAGNOSIS — Z79899 Other long term (current) drug therapy: Secondary | ICD-10-CM | POA: Diagnosis not present

## 2022-02-16 DIAGNOSIS — M79643 Pain in unspecified hand: Secondary | ICD-10-CM | POA: Diagnosis not present

## 2022-02-16 DIAGNOSIS — M4690 Unspecified inflammatory spondylopathy, site unspecified: Secondary | ICD-10-CM | POA: Diagnosis not present

## 2022-02-17 DIAGNOSIS — N3946 Mixed incontinence: Secondary | ICD-10-CM | POA: Diagnosis not present

## 2022-02-17 DIAGNOSIS — R208 Other disturbances of skin sensation: Secondary | ICD-10-CM | POA: Diagnosis not present

## 2022-02-17 DIAGNOSIS — G894 Chronic pain syndrome: Secondary | ICD-10-CM | POA: Diagnosis not present

## 2022-02-17 DIAGNOSIS — N1832 Chronic kidney disease, stage 3b: Secondary | ICD-10-CM | POA: Diagnosis not present

## 2022-02-17 DIAGNOSIS — I10 Essential (primary) hypertension: Secondary | ICD-10-CM | POA: Diagnosis not present

## 2022-02-17 DIAGNOSIS — Z23 Encounter for immunization: Secondary | ICD-10-CM | POA: Diagnosis not present

## 2022-02-17 DIAGNOSIS — E1169 Type 2 diabetes mellitus with other specified complication: Secondary | ICD-10-CM | POA: Diagnosis not present

## 2022-02-17 DIAGNOSIS — M47817 Spondylosis without myelopathy or radiculopathy, lumbosacral region: Secondary | ICD-10-CM | POA: Diagnosis not present

## 2022-02-19 DIAGNOSIS — E1169 Type 2 diabetes mellitus with other specified complication: Secondary | ICD-10-CM | POA: Diagnosis not present

## 2022-02-19 DIAGNOSIS — I10 Essential (primary) hypertension: Secondary | ICD-10-CM | POA: Diagnosis not present

## 2022-02-19 DIAGNOSIS — N1832 Chronic kidney disease, stage 3b: Secondary | ICD-10-CM | POA: Diagnosis not present

## 2022-02-22 DIAGNOSIS — I129 Hypertensive chronic kidney disease with stage 1 through stage 4 chronic kidney disease, or unspecified chronic kidney disease: Secondary | ICD-10-CM | POA: Diagnosis not present

## 2022-02-22 DIAGNOSIS — E875 Hyperkalemia: Secondary | ICD-10-CM | POA: Diagnosis not present

## 2022-02-22 DIAGNOSIS — E1122 Type 2 diabetes mellitus with diabetic chronic kidney disease: Secondary | ICD-10-CM | POA: Diagnosis not present

## 2022-02-22 DIAGNOSIS — N1832 Chronic kidney disease, stage 3b: Secondary | ICD-10-CM | POA: Diagnosis not present

## 2022-02-23 ENCOUNTER — Ambulatory Visit (INDEPENDENT_AMBULATORY_CARE_PROVIDER_SITE_OTHER): Payer: Medicare PPO | Admitting: Obstetrics and Gynecology

## 2022-02-23 ENCOUNTER — Encounter: Payer: Self-pay | Admitting: Obstetrics and Gynecology

## 2022-02-23 VITALS — BP 146/89 | HR 80 | Ht 60.0 in | Wt 186.0 lb

## 2022-02-23 DIAGNOSIS — N393 Stress incontinence (female) (male): Secondary | ICD-10-CM

## 2022-02-23 DIAGNOSIS — N3281 Overactive bladder: Secondary | ICD-10-CM | POA: Diagnosis not present

## 2022-02-23 DIAGNOSIS — R350 Frequency of micturition: Secondary | ICD-10-CM | POA: Diagnosis not present

## 2022-02-23 LAB — POCT URINALYSIS DIPSTICK
Bilirubin, UA: NEGATIVE
Blood, UA: NEGATIVE
Glucose, UA: POSITIVE — AB
Ketones, UA: NEGATIVE
Leukocytes, UA: NEGATIVE
Nitrite, UA: NEGATIVE
Protein, UA: POSITIVE — AB
Spec Grav, UA: 1.02 (ref 1.010–1.025)
Urobilinogen, UA: 0.2 E.U./dL
pH, UA: 7.5 (ref 5.0–8.0)

## 2022-02-23 MED ORDER — MIRABEGRON ER 25 MG PO TB24: 25.0000 mg | ORAL_TABLET | Freq: Every day | ORAL | 5 refills | Status: DC

## 2022-02-23 NOTE — Patient Instructions (Addendum)
Today we talked about ways to manage bladder urgency such as altering your diet to avoid irritative beverages and foods (bladder diet) as well as attempting to decrease stress and other exacerbating factors.   The Most Bothersome Foods* The Least Bothersome Foods*  Coffee - Regular & Decaf Tea - caffeinated Carbonated beverages - cola, non-colas, diet & caffeine-free Alcohols - Beer, Red Wine, White Wine, Champagne Fruits - Grapefruit, Lemon, Orange, Pineapple Fruit Juices - Cranberry, Grapefruit, Orange, Pineapple Vegetables - Tomato & Tomato Products Flavor Enhancers - Hot peppers, Spicy foods, Chili, Horseradish, Vinegar, Monosodium glutamate (MSG) Artificial Sweeteners - NutraSweet, Sweet 'N Low, Equal (sweetener), Saccharin Ethnic foods - Mexican, Thai, Indian food Water Milk - low-fat & whole Fruits - Bananas, Blueberries, Honeydew melon, Pears, Raisins, Watermelon Vegetables - Broccoli, Brussels Sprouts, Cabbage, Carrots, Cauliflower, Celery, Cucumber, Mushrooms, Peas, Radishes, Squash, Zucchini, White potatoes, Sweet potatoes & yams Poultry - Chicken, Eggs, Turkey, Meat - Beef, Pork, Lamb Seafood - Shrimp, Tuna fish, Salmon Grains - Oat, Rice Snacks - Pretzels, Popcorn  *Friedlander J. et al. Diet and its role in interstitial cystitis/bladder pain syndrome (IC/BPS) and comorbid conditions. [employer]. [employer]. 2012 Jan 11.    We discussed the symptoms of overactive bladder (OAB), which include urinary urgency, urinary frequency, night-time urination, with or without urge incontinence.  We discussed management including behavioral therapy (decreasing bladder irritants by following a bladder diet, urge suppression strategies, timed voids, bladder retraining), physical therapy, medication; and for refractory cases posterior tibial nerve stimulation, sacral neuromodulation, and intravesical botulinum toxin injection.   For treatment of stress urinary incontinence, which is leakage  with physical activity/movement/strainging/coughing, we discussed expectant management versus nonsurgical options versus surgery. Nonsurgical options include weight loss, physical therapy, as well as a pessary.  Surgical options include a midurethral sling, which is a synthetic mesh sling that acts like a hammock under the urethra to prevent leakage of urine, and transurethral injection of a bulking agent.   

## 2022-02-23 NOTE — Progress Notes (Signed)
Vanessa Frank New Patient Evaluation and Consultation  Referring Provider: Lorenda Frank PCP: Vanessa Ishihara, Vanessa Frank Date of Service: 02/23/2022  SUBJECTIVE Chief Complaint: New Patient (Initial Visit) Vanessa Frank is a 76 y.o. female here for a consult on incontinence. Pt said she has control when she urinates. )  History of Present Illness: Vanessa Frank is a 76 y.o. White or Caucasian female seen in consultation at the request of Vanessa Frank for evaluation of incontinence.    Review of records from Dr Vanessa Frank significant for: Has both stress and urge incontinence, needs to wear pads.  Has history of state IIIB chronic kidney disease. Has well controlled DM- last A1c 6.6%.   Urinary Symptoms: Leaks urine with cough/ sneeze, with a full bladder, with movement to the bathroom, and with urgency. Symptoms have been worsening over the last year. Usually leaks every time on the way to the bathroom.  Leaks 1-2 time(s) per day Pad use: 1 pads per day.   She is bothered by her UI symptoms.  Day time voids 3.  Nocturia: 2-3 times per night to void. Voiding dysfunction: she does not empty her bladder well.  does not use a catheter to empty bladder.  When urinating, she feels she has no difficulties Drinks: 3-4 cups water with crystal light (sugar free strawberry), adds apple cider vinegar, 8 oz sugar free caffeine free soda, decaf herbal tea, occasional cup coffee  UTIs:  0  UTI's in the last year.   Denies history of blood in urine and kidney or bladder stones  Pelvic Organ Prolapse Symptoms:                  She Denies a feeling of a bulge the vaginal area.   Bowel Symptom: Bowel movements: every other day Stool consistency: hard or soft  Straining: yes.  Splinting: no.  Incomplete evacuation: yes.  She Denies accidental bowel leakage / fecal incontinence Bowel regimen: none  Sexual Function Sexually active: no.    Pelvic Pain Denies pelvic  pain Has some pain in lower abdomen when she needs to have a BM.    Past Medical History:  Past Medical History:  Diagnosis Date   Anxiety    Arthritis    Diabetes mellitus ORAL MEDS ONLY   Dysrhythmia IRREGULAR  RATE IN HER 20'S   Fibromyalgia    GERD (gastroesophageal reflux disease)    Headache(784.0)    Hypertension    Neuropathy    TOES   Palpitations    OCCASIONAL   Shortness of breath WITH EXERTION   Stress incontinence      Past Surgical History:   Past Surgical History:  Procedure Laterality Date   ABDOMINAL HYSTERECTOMY  80'S   Partial    ANTERIOR LAT LUMBAR FUSION  03/26/2011   Procedure: ANTERIOR LATERAL LUMBAR FUSION 1 LEVEL;  Surgeon: Vanessa Frank;  Location: [facility];  Service: Neurosurgery;  Laterality: Left;  Left Lumbar three-four anterolateral retroperitoneal interbody fusion    ARTHROSCOPY KNEE W/ Frank  09 LEFT   2012 RIGHT   BARTHOLIN GLAND CYST EXCISION  1983   CHOLECYSTECTOMY  1989   CYSTOSCOPY  80'S   FOOT SURGERY Right    SPINAL FUSION  2012   Lower Back   TOTAL KNEE ARTHROPLASTY Left 05/28/2013   Procedure: LEFT TOTAL KNEE ARTHROPLASTY;  Surgeon: Vanessa Drilling, Vanessa Frank;  Location: [facility];  Service: Orthopedics;  Laterality: Left;   TOTAL KNEE ARTHROPLASTY Right 10/29/2013  Procedure: RIGHT TOTAL KNEE ARTHROPLASTY;  Surgeon: Vanessa Alf, Vanessa Frank;  Location: [facility];  Service: Orthopedics;  Laterality: Right;     Past OB/GYN History: OB History  Gravida Para Term Preterm AB Living  2 1 1   1 1   SAB IAB Ectopic Multiple Live Births  1       1    # Outcome Date GA Lbr Len/2nd Weight Sex Delivery Anes PTL Lv  2 Term      Vag-Spont     1 SAB             S/p hysterectomy   Medications: She has a current medication list which includes the following prescription(s): mirabegron er, atorvastatin, vitamin b complex, carboxymethylcellulose, elderberry, furosemide, lisinopril, melatonin, metformin, multivitamin, oxycodone-acetaminophen,  pioglitazone, and sertraline.   Allergies: Patient is allergic to adhesive [tape], codeine, latex, and chocolate.   Social History:  Social History   Tobacco Use   Smoking status: Never   Smokeless tobacco: Never  Vaping Use   Vaping Use: Never used  Substance Use Topics   Alcohol use: No   Drug use: No    Relationship status: married She lives with her spouse and son/ daughter in law She is not employed. Regular exercise: No History of abuse: No  Family History:   Family History  Problem Relation Age of Onset   Depression Mother    Congestive Heart Failure Mother    Allergy (severe) Mother    Heart disease Mother    Alcoholism Mother    Hypertension Mother    Osteoarthritis Mother    Congestive Heart Failure Father    Cancer Father    Depression Maternal Grandmother    Osteoarthritis Maternal Grandmother    Depression Sister    Rheum arthritis Paternal Grandmother    Osteoarthritis Paternal Grandmother    Osteoarthritis Sister    Clotting disorder Sister      Review of Systems: Review of Systems  Constitutional:  Negative for fever, malaise/fatigue and weight loss.  Respiratory:  Negative for cough, shortness of breath and wheezing.   Cardiovascular:  Negative for chest pain, palpitations and leg swelling.  Gastrointestinal:  Negative for abdominal pain and blood in stool.  Genitourinary:  Negative for dysuria.  Musculoskeletal:  Negative for myalgias.  Skin:  Negative for rash.  Neurological:  Negative for dizziness and headaches.  Endo/Heme/Allergies:  Does not bruise/bleed easily.  Psychiatric/Behavioral:  Negative for depression. The patient is not nervous/anxious.      OBJECTIVE Physical Exam: Vitals:   02/23/22 1323  BP: (!) 146/89  Pulse: 80  Weight: 186 lb (84.4 kg)  Height: 5' (1.524 m)    Physical Exam Constitutional:      General: She is not in acute distress. Pulmonary:     Effort: Pulmonary effort is normal.  Abdominal:      General: There is no distension.     Palpations: Abdomen is soft.     Tenderness: There is no abdominal tenderness. There is no rebound.  Musculoskeletal:        General: No swelling. Normal range of motion.  Skin:    General: Skin is warm and dry.     Findings: No rash.  Neurological:     Mental Status: She is Frank and oriented to person, place, and time.  Psychiatric:        Mood and Affect: Mood normal.        Behavior: Behavior normal.     GU / Detailed  Urogynecologic Evaluation:  Pelvic Exam: Normal external female genitalia; Bartholin's and Skene's glands normal in appearance; urethral meatus normal in appearance, no urethral masses or discharge.   CST: negative  s/p hysterectomy: Speculum exam reveals normal vaginal mucosa with  atrophy and normal vaginal cuff.  Adnexa no mass, fullness, tenderness.    Pelvic floor strength II/V  Pelvic floor musculature: Right levator non-tender, Right obturator non-tender, Left levator non-tender, Left obturator non-tender  POP-Q:   POP-Q  -3                                            Aa   -3                                           Ba  -8.5                                              C   3                                            Gh  4                                            Pb  8.5                                            tvl   -3                                            Ap  -3                                            Bp                                                 D      Rectal Exam:  Normal external rectum  Post-Void Residual (PVR): In order to evaluate bladder emptying, we discussed obtaining a postvoid residual and she agreed to this procedure.  Straight catheterization was performed and PVR of 32ml was obtained.   Laboratory Results: POC urine: negative   ASSESSMENT AND PLAN Ms. Boan is a 76 y.o. with:  1. Overactive bladder   2. Urinary frequency   3. SUI (stress urinary  incontinence, female)    OAB - We discussed the symptoms of overactive bladder (OAB), which include urinary urgency, urinary  frequency, nocturia, with or without urge incontinence.  While we do not know the exact etiology of OAB, several treatment options exist. We discussed management including behavioral therapy (decreasing bladder irritants, urge suppression strategies, timed voids, bladder retraining), physical therapy, medication; for refractory cases posterior tibial nerve stimulation, sacral neuromodulation, and intravesical botulinum toxin injection.  - Prescribed Myrbetriq 25mg , samples provided. For Beta-3 agonist medication, we discussed the potential side effect of elevated blood pressure which is more likely to occur in individuals with uncontrolled hypertension.  2. SUI - For treatment of stress urinary incontinence,  non-surgical options include expectant management, weight loss, physical therapy, as well as a pessary.  Surgical options include a midurethral sling, Burch urethropexy, and transurethral injection of a bulking agent. - She is considering her options. Would like to focus on OAB symptoms first. She is also considering pelvic PT.   Return 6 weeks   , Vanessa Frank

## 2022-02-26 DIAGNOSIS — Z1231 Encounter for screening mammogram for malignant neoplasm of breast: Secondary | ICD-10-CM | POA: Diagnosis not present

## 2022-03-11 ENCOUNTER — Encounter (HOSPITAL_COMMUNITY): Payer: Self-pay

## 2022-03-11 ENCOUNTER — Inpatient Hospital Stay (HOSPITAL_BASED_OUTPATIENT_CLINIC_OR_DEPARTMENT_OTHER)
Admission: EM | Admit: 2022-03-11 | Discharge: 2022-03-13 | DRG: 189 | Disposition: A | Discharge status: Home / Self Care | Payer: Medicare PPO | Attending: Family Medicine | Admitting: Family Medicine

## 2022-03-11 ENCOUNTER — Other Ambulatory Visit: Payer: Self-pay

## 2022-03-11 ENCOUNTER — Emergency Department (HOSPITAL_BASED_OUTPATIENT_CLINIC_OR_DEPARTMENT_OTHER): Payer: Medicare PPO

## 2022-03-11 ENCOUNTER — Encounter (HOSPITAL_BASED_OUTPATIENT_CLINIC_OR_DEPARTMENT_OTHER): Payer: Self-pay | Admitting: Emergency Medicine

## 2022-03-11 DIAGNOSIS — E876 Hypokalemia: Secondary | ICD-10-CM | POA: Diagnosis present

## 2022-03-11 DIAGNOSIS — Z832 Family history of diseases of the blood and blood-forming organs and certain disorders involving the immune mechanism: Secondary | ICD-10-CM

## 2022-03-11 DIAGNOSIS — R0902 Hypoxemia: Secondary | ICD-10-CM | POA: Diagnosis not present

## 2022-03-11 DIAGNOSIS — K219 Gastro-esophageal reflux disease without esophagitis: Secondary | ICD-10-CM | POA: Diagnosis present

## 2022-03-11 DIAGNOSIS — Z885 Allergy status to narcotic agent status: Secondary | ICD-10-CM | POA: Diagnosis not present

## 2022-03-11 DIAGNOSIS — Z91018 Allergy to other foods: Secondary | ICD-10-CM | POA: Diagnosis not present

## 2022-03-11 DIAGNOSIS — Z1152 Encounter for screening for COVID-19: Secondary | ICD-10-CM | POA: Diagnosis not present

## 2022-03-11 DIAGNOSIS — Z90711 Acquired absence of uterus with remaining cervical stump: Secondary | ICD-10-CM

## 2022-03-11 DIAGNOSIS — Z91040 Latex allergy status: Secondary | ICD-10-CM | POA: Diagnosis not present

## 2022-03-11 DIAGNOSIS — Z888 Allergy status to other drugs, medicaments and biological substances status: Secondary | ICD-10-CM | POA: Diagnosis not present

## 2022-03-11 DIAGNOSIS — M797 Fibromyalgia: Secondary | ICD-10-CM | POA: Diagnosis present

## 2022-03-11 DIAGNOSIS — J210 Acute bronchiolitis due to respiratory syncytial virus: Secondary | ICD-10-CM | POA: Diagnosis present

## 2022-03-11 DIAGNOSIS — E669 Obesity, unspecified: Secondary | ICD-10-CM | POA: Diagnosis present

## 2022-03-11 DIAGNOSIS — I2510 Atherosclerotic heart disease of native coronary artery without angina pectoris: Secondary | ICD-10-CM | POA: Diagnosis present

## 2022-03-11 DIAGNOSIS — F419 Anxiety disorder, unspecified: Secondary | ICD-10-CM | POA: Diagnosis present

## 2022-03-11 DIAGNOSIS — N1831 Chronic kidney disease, stage 3a: Secondary | ICD-10-CM | POA: Diagnosis present

## 2022-03-11 DIAGNOSIS — E1142 Type 2 diabetes mellitus with diabetic polyneuropathy: Secondary | ICD-10-CM | POA: Diagnosis present

## 2022-03-11 DIAGNOSIS — E119 Type 2 diabetes mellitus without complications: Secondary | ICD-10-CM

## 2022-03-11 DIAGNOSIS — J129 Viral pneumonia, unspecified: Secondary | ICD-10-CM | POA: Diagnosis present

## 2022-03-11 DIAGNOSIS — J9601 Acute respiratory failure with hypoxia: Principal | ICD-10-CM | POA: Diagnosis present

## 2022-03-11 DIAGNOSIS — Z811 Family history of alcohol abuse and dependence: Secondary | ICD-10-CM | POA: Diagnosis not present

## 2022-03-11 DIAGNOSIS — Z8249 Family history of ischemic heart disease and other diseases of the circulatory system: Secondary | ICD-10-CM

## 2022-03-11 DIAGNOSIS — B338 Other specified viral diseases: Secondary | ICD-10-CM | POA: Insufficient documentation

## 2022-03-11 DIAGNOSIS — Z6836 Body mass index (BMI) 36.0-36.9, adult: Secondary | ICD-10-CM

## 2022-03-11 DIAGNOSIS — N3281 Overactive bladder: Secondary | ICD-10-CM | POA: Diagnosis present

## 2022-03-11 DIAGNOSIS — E785 Hyperlipidemia, unspecified: Secondary | ICD-10-CM | POA: Diagnosis present

## 2022-03-11 DIAGNOSIS — I10 Essential (primary) hypertension: Secondary | ICD-10-CM | POA: Diagnosis present

## 2022-03-11 DIAGNOSIS — R0602 Shortness of breath: Secondary | ICD-10-CM | POA: Diagnosis not present

## 2022-03-11 DIAGNOSIS — I129 Hypertensive chronic kidney disease with stage 1 through stage 4 chronic kidney disease, or unspecified chronic kidney disease: Secondary | ICD-10-CM | POA: Diagnosis present

## 2022-03-11 LAB — CBC WITH DIFFERENTIAL/PLATELET
Abs Immature Granulocytes: 0.03 10*3/uL (ref 0.00–0.07)
Basophils Absolute: 0 10*3/uL (ref 0.0–0.1)
Basophils Relative: 0 %
Eosinophils Absolute: 0 10*3/uL (ref 0.0–0.5)
Eosinophils Relative: 0 %
HCT: 32.7 % — ABNORMAL LOW (ref 36.0–46.0)
Hemoglobin: 10.9 g/dL — ABNORMAL LOW (ref 12.0–15.0)
Immature Granulocytes: 0 %
Lymphocytes Relative: 22 %
Lymphs Abs: 1.7 10*3/uL (ref 0.7–4.0)
MCH: 29.4 pg (ref 26.0–34.0)
MCHC: 33.3 g/dL (ref 30.0–36.0)
MCV: 88.1 fL (ref 80.0–100.0)
Monocytes Absolute: 0.7 10*3/uL (ref 0.1–1.0)
Monocytes Relative: 9 %
Neutro Abs: 5.3 10*3/uL (ref 1.7–7.7)
Neutrophils Relative %: 69 %
Platelets: 196 10*3/uL (ref 150–400)
RBC: 3.71 MIL/uL — ABNORMAL LOW (ref 3.87–5.11)
RDW: 13.4 % (ref 11.5–15.5)
WBC: 7.6 10*3/uL (ref 4.0–10.5)
nRBC: 0 % (ref 0.0–0.2)

## 2022-03-11 LAB — TROPONIN I (HIGH SENSITIVITY)
Troponin I (High Sensitivity): 24 ng/L — ABNORMAL HIGH (ref <18)
Troponin I (High Sensitivity): 19 ng/L — ABNORMAL HIGH (ref <18)

## 2022-03-11 LAB — RESPIRATORY PANEL BY PCR

## 2022-03-11 LAB — COMPREHENSIVE METABOLIC PANEL
ALT: 31 U/L (ref 0–44)
AST: 43 U/L — ABNORMAL HIGH (ref 15–41)
Albumin: 3.9 g/dL (ref 3.5–5.0)
Alkaline Phosphatase: 61 U/L (ref 38–126)
Anion gap: 9 (ref 5–15)
BUN: 25 mg/dL — ABNORMAL HIGH (ref 8–23)
CO2: 21 mmol/L — ABNORMAL LOW (ref 22–32)
Calcium: 8 mg/dL — ABNORMAL LOW (ref 8.9–10.3)
Chloride: 105 mmol/L (ref 98–111)
Creatinine, Ser: 1.2 mg/dL — ABNORMAL HIGH (ref 0.44–1.00)
GFR, Estimated: 47 mL/min — ABNORMAL LOW (ref >60)
Glucose, Bld: 145 mg/dL — ABNORMAL HIGH (ref 70–99)
Potassium: 3.3 mmol/L — ABNORMAL LOW (ref 3.5–5.1)
Sodium: 135 mmol/L (ref 135–145)
Total Bilirubin: 0.7 mg/dL (ref 0.3–1.2)
Total Protein: 7 g/dL (ref 6.5–8.1)

## 2022-03-11 LAB — RESP PANEL BY RT-PCR (FLU A&B, COVID) ARPGX2
Influenza A by PCR: NEGATIVE
Influenza B by PCR: NEGATIVE
SARS Coronavirus 2 by RT PCR: NEGATIVE

## 2022-03-11 LAB — BRAIN NATRIURETIC PEPTIDE: B Natriuretic Peptide: 153.7 pg/mL — ABNORMAL HIGH (ref 0.0–100.0)

## 2022-03-11 MED ORDER — METHYLPREDNISOLONE SODIUM SUCC 125 MG IJ SOLR
125.0000 mg | Freq: Once | INTRAMUSCULAR | Status: AC
  Administered 2022-03-11: 125 mg via INTRAVENOUS
  Filled 2022-03-11: qty 2

## 2022-03-11 MED ORDER — IPRATROPIUM BROMIDE 0.02 % IN SOLN
0.5000 mg | Freq: Once | RESPIRATORY_TRACT | Status: AC
  Administered 2022-03-11: 0.5 mg via RESPIRATORY_TRACT
  Filled 2022-03-11: qty 2.5

## 2022-03-11 MED ORDER — ALBUTEROL SULFATE (2.5 MG/3ML) 0.083% IN NEBU
5.0000 mg | INHALATION_SOLUTION | Freq: Once | RESPIRATORY_TRACT | Status: AC
  Administered 2022-03-11: 5 mg via RESPIRATORY_TRACT
  Filled 2022-03-11: qty 6

## 2022-03-11 NOTE — ED Provider Notes (Signed)
[facility] EMERGENCY DEPARTMENT Provider Note   CSN: [id number] Arrival date & time: 03/11/22  1737     History  Chief Complaint  Patient presents with   Shortness of Breath    Vanessa Frank is a 76 y.o. female history of HTN, diabetes, here presenting with shortness of breath.  Patient states that her grandson came to visit her recently.  She states that he was recently diagnosed with RSV virus.  She has been coughing for the last week or so.  They have been checking her oxygen level and is been around 83 to 85% at home.  She normally is not on oxygen.  She does not have any history of COPD or any lung disease.  The history is provided by the patient.       Home Medications Prior to Admission medications   Medication Sig Start Date End Date Taking? Authorizing Provider  atorvastatin (LIPITOR) 10 MG tablet Take 10 mg by mouth at bedtime.    [provider]  B Complex Vitamins (VITAMIN B COMPLEX) TABS Take 1 tablet by mouth daily.    [provider]  carboxymethylcellulose (REFRESH PLUS) 0.5 % SOLN Place 1 drop into both eyes 3 (three) times daily as needed (dry eyes).    [provider]  ELDERBERRY PO Take 1 capsule by mouth daily.    [provider]  furosemide (LASIX) 40 MG tablet Take 40 mg by mouth once as needed.    [provider]  lisinopril (PRINIVIL,ZESTRIL) 10 MG tablet Take 10 mg by mouth every morning.    [provider]  melatonin 5 MG TABS Take 5 mg by mouth at bedtime.    [provider]  metFORMIN (GLUCOPHAGE) 1000 MG tablet Take 1,000 mg by mouth 2 (two) times daily with a meal.    [provider]  mirabegron ER (MYRBETRIQ) 25 MG TB24 tablet Take 1 tablet (25 mg total) by mouth daily. 02/23/22   Marguerita Beards, MD  Multiple Vitamin (MULTIVITAMIN) capsule Take 1 capsule by mouth daily.    [provider]  oxyCODONE-acetaminophen (PERCOCET) 10-325 MG tablet Take 1-2  tablets by mouth daily as needed for pain. 06/15/17   [provider]  pioglitazone (ACTOS) 30 MG tablet TAKE 1 TABLET BY MOUTH EVERY MORNING Patient taking differently: Take 30 mg by mouth daily. 03/18/14   [name], [name], DO  sertraline (ZOLOFT) 100 MG tablet Take 100 mg by mouth every morning.    [provider]      Allergies    Adhesive [tape], Codeine, Latex, and Chocolate    Review of Systems   Review of Systems  Respiratory:  Positive for shortness of breath.   All other systems reviewed and are negative.   Physical Exam Updated Vital Signs BP (!) 125/58   Pulse 86   Temp 99.5 F (37.5 C) (Oral)   Resp 19   Ht 5' (1.524 m)   Wt 84.4 kg   SpO2 95%   BMI 36.33 kg/m  Physical Exam Vitals and nursing note reviewed.  Constitutional:      Comments: Chronically ill and tachypneic  HENT:     Head: Normocephalic.     Mouth/Throat:     Mouth: Mucous membranes are moist.     Pharynx: Oropharynx is clear.  Eyes:     Extraocular Movements: Extraocular movements intact.     Pupils: Pupils are equal, round, and reactive to light.  Cardiovascular:  Rate and Rhythm: Normal rate and regular rhythm.  Pulmonary:     Comments: Diffuse wheezing Abdominal:     General: Bowel sounds are normal.     Palpations: Abdomen is soft.  Musculoskeletal:        General: Normal range of motion.     Cervical back: Normal range of motion and neck supple.  Skin:    General: Skin is warm.     Capillary Refill: Capillary refill takes less than 2 seconds.  Neurological:     General: No focal deficit present.     Mental Status: She is oriented to person, place, and time.  Psychiatric:        Mood and Affect: Mood normal.        Behavior: Behavior normal.     ED Results / Procedures / Treatments   Labs (all labs ordered are listed, but only abnormal results are displayed) Labs Reviewed  CBC WITH DIFFERENTIAL/PLATELET - Abnormal; Notable for the following components:       Result Value   RBC 3.71 (*)    Hemoglobin 10.9 (*)    HCT 32.7 (*)    All other components within normal limits  COMPREHENSIVE METABOLIC PANEL - Abnormal; Notable for the following components:   Potassium 3.3 (*)    CO2 21 (*)    Glucose, Bld 145 (*)    BUN 25 (*)    Creatinine, Ser 1.20 (*)    Calcium 8.0 (*)    AST 43 (*)    GFR, Estimated 47 (*)    All other components within normal limits  BRAIN NATRIURETIC PEPTIDE - Abnormal; Notable for the following components:   B Natriuretic Peptide 153.7 (*)    All other components within normal limits  TROPONIN I (HIGH SENSITIVITY) - Abnormal; Notable for the following components:   Troponin I (High Sensitivity) 24 (*)    All other components within normal limits  RESP PANEL BY RT-PCR (FLU A&B, COVID) [id number]    EKG EKG Interpretation  Date/Time:  Thursday March 11 2022 17:47:56 EST Ventricular Rate:  96 PR Interval:  136 QRS Duration: 72 QT Interval:  332 QTC Calculation: 419 R Axis:   -19 Text Interpretation: Normal sinus rhythm Anteroseptal infarct , age undetermined Abnormal ECG When compared with ECG of 13-Jul-2021 09:01, PREVIOUS ECG IS PRESENT Confirmed by Richardean Canal ([id number]) on 03/11/2022 5:49:46 PM  Radiology DG Chest Port 1 View  Result Date: 03/11/2022 CLINICAL DATA:  Shortness of breath EXAM: PORTABLE CHEST 1 VIEW COMPARISON:  07/04/2020 FINDINGS: The heart size and mediastinal contours are within normal limits. Both lungs are clear. The visualized skeletal structures are unremarkable. IMPRESSION: No acute abnormality of the lungs in AP portable projection. Electronically Signed   By: Jearld Lesch M.D.   On: 03/11/2022 18:51    Procedures Procedures    CRITICAL CARE Performed by: Richardean Canal   Total critical care time: 30 minutes  Critical care time was exclusive of separately billable procedures and treating other patients.  Critical care was necessary to treat or prevent imminent or  life-threatening deterioration.  Critical care was time spent personally by me on the following activities: development of treatment plan with patient and/or surrogate as well as nursing, discussions with consultants, evaluation of patient's response to treatment, examination of patient, obtaining history from patient or surrogate, ordering and performing treatments and interventions, ordering and review of laboratory studies, ordering and review of radiographic studies, pulse oximetry and re-evaluation of patient's condition.  Medications Ordered in ED Medications  albuterol (PROVENTIL) (2.5 MG/3ML) 0.083% nebulizer solution 5 mg (5 mg Nebulization Given 03/11/22 1838)  ipratropium (ATROVENT) nebulizer solution 0.5 mg (0.5 mg Nebulization Given 03/11/22 1838)  methylPREDNISolone sodium succinate (SOLU-MEDROL) 125 mg/2 mL injection 125 mg (125 mg Intravenous Given 03/11/22 1841)    ED Course/ Medical Decision Making/ A&P                           Medical Decision Making Vanessa Frank is a 76 y.o. female here with shortness of breath and hypoxia. I think likely pneumonia vs RSV vs COVID. Patient's grandson has RSV. Will get cbc, cmp, bnp, covid and flu and RSV and CXR.   8:32 PM Reviewed patient's labs and her CBC and CMP were unremarkable.  Respiratory panel showed positive RSV but the lab is unable to crossover.  I ordered a respiratory panel test so we can get the result of the RSV.  Patient was given steroids and albuterol.  Patient will be admitted for hypoxia from RSV   Problems Addressed: Hypoxia: acute illness or injury RSV (acute bronchiolitis due to respiratory syncytial virus): acute illness or injury  Amount and/or Complexity of Data Reviewed Labs: ordered. Decision-making details documented in ED Course. Radiology: ordered and independent interpretation performed. Decision-making details documented in ED Course.  Risk Prescription drug management. Decision regarding  hospitalization.    Final Clinical Impression(s) / ED Diagnoses Final diagnoses:  None    Rx / DC Orders ED Discharge Orders     None         Charlynne Pander, MD 03/11/22 2033

## 2022-03-11 NOTE — ED Triage Notes (Signed)
Shob x 1 week. Reports exposure to RSV by grandson. Pt 90% on RA at rest.

## 2022-03-11 NOTE — Progress Notes (Signed)
Plan of Care Note for accepted transfer   Patient: Vanessa Frank MRN: [id number]   DOA: 03/11/2022  Facility requesting transfer: [facility] ED Requesting Provider: Dr. Silverio Lay, EDP Reason for transfer: Acute hypoxic respiratory failure secondary to RSV viral infection.  Facility course: The patient is a 76 year old female with past medical history significant for essential hypertension, type 2 diabetes, obesity, no known history of COPD or asthma, who presented to [facility] ED with complaints of shortness of breath of a few days duration with direct exposure to RSV in her grandson who visited her at home.  Her daughter is a Engineer, civil (consulting) and checked her pulse ox at home.  It was in the mid 80s, which prompted her to come to the ED for further evaluation.  In the ED, hypoxic with O2 saturation of 89% on room air.  Audible wheezing on exam.  Chest x-ray nonacute.  Patient currently requiring 2 L oxygen supplementation nasal cannula to maintain her oxygen saturation above 90%.    Her labs were significant for elevated BNP 153, creatinine of 1.20 with GFR 47, which is her baseline.  She received albuterol neb treatment, Atrovent neb treatment, and a dose of IV Solu-Medrol 125 mg x 1.  Due to persistent symptoms, EDP requested admission for acute hypoxic respiratory failure secondary to RSV infection.  Plan of care: The patient is accepted for admission to Telemetry unit, at [facility] as inpatient status.  Author: Darlin Drop, DO 03/11/2022  Check [email or web address] for on-call coverage.  Nursing staff, Please call [facility] Admits & Consults System-Wide number on Amion as soon as patient's arrival, so appropriate admitting provider can evaluate the pt.

## 2022-03-12 ENCOUNTER — Encounter (HOSPITAL_COMMUNITY): Payer: Self-pay | Admitting: Internal Medicine

## 2022-03-12 DIAGNOSIS — Z885 Allergy status to narcotic agent status: Secondary | ICD-10-CM | POA: Diagnosis not present

## 2022-03-12 DIAGNOSIS — E876 Hypokalemia: Secondary | ICD-10-CM | POA: Diagnosis present

## 2022-03-12 DIAGNOSIS — Z811 Family history of alcohol abuse and dependence: Secondary | ICD-10-CM | POA: Diagnosis not present

## 2022-03-12 DIAGNOSIS — I2510 Atherosclerotic heart disease of native coronary artery without angina pectoris: Secondary | ICD-10-CM | POA: Diagnosis present

## 2022-03-12 DIAGNOSIS — Z832 Family history of diseases of the blood and blood-forming organs and certain disorders involving the immune mechanism: Secondary | ICD-10-CM | POA: Diagnosis not present

## 2022-03-12 DIAGNOSIS — N3281 Overactive bladder: Secondary | ICD-10-CM | POA: Diagnosis present

## 2022-03-12 DIAGNOSIS — B338 Other specified viral diseases: Secondary | ICD-10-CM | POA: Insufficient documentation

## 2022-03-12 DIAGNOSIS — N1831 Chronic kidney disease, stage 3a: Secondary | ICD-10-CM | POA: Diagnosis present

## 2022-03-12 DIAGNOSIS — Z90711 Acquired absence of uterus with remaining cervical stump: Secondary | ICD-10-CM | POA: Diagnosis not present

## 2022-03-12 DIAGNOSIS — Z91018 Allergy to other foods: Secondary | ICD-10-CM | POA: Diagnosis not present

## 2022-03-12 DIAGNOSIS — I129 Hypertensive chronic kidney disease with stage 1 through stage 4 chronic kidney disease, or unspecified chronic kidney disease: Secondary | ICD-10-CM | POA: Diagnosis present

## 2022-03-12 DIAGNOSIS — F419 Anxiety disorder, unspecified: Secondary | ICD-10-CM | POA: Diagnosis present

## 2022-03-12 DIAGNOSIS — Z1152 Encounter for screening for COVID-19: Secondary | ICD-10-CM | POA: Diagnosis not present

## 2022-03-12 DIAGNOSIS — J9601 Acute respiratory failure with hypoxia: Secondary | ICD-10-CM | POA: Diagnosis present

## 2022-03-12 DIAGNOSIS — Z8249 Family history of ischemic heart disease and other diseases of the circulatory system: Secondary | ICD-10-CM | POA: Diagnosis not present

## 2022-03-12 DIAGNOSIS — J210 Acute bronchiolitis due to respiratory syncytial virus: Secondary | ICD-10-CM | POA: Diagnosis present

## 2022-03-12 DIAGNOSIS — Z91040 Latex allergy status: Secondary | ICD-10-CM | POA: Diagnosis not present

## 2022-03-12 DIAGNOSIS — E785 Hyperlipidemia, unspecified: Secondary | ICD-10-CM | POA: Diagnosis present

## 2022-03-12 DIAGNOSIS — Z6836 Body mass index (BMI) 36.0-36.9, adult: Secondary | ICD-10-CM | POA: Diagnosis not present

## 2022-03-12 DIAGNOSIS — M797 Fibromyalgia: Secondary | ICD-10-CM | POA: Diagnosis present

## 2022-03-12 DIAGNOSIS — Z888 Allergy status to other drugs, medicaments and biological substances status: Secondary | ICD-10-CM | POA: Diagnosis not present

## 2022-03-12 DIAGNOSIS — J129 Viral pneumonia, unspecified: Secondary | ICD-10-CM | POA: Diagnosis present

## 2022-03-12 DIAGNOSIS — E1142 Type 2 diabetes mellitus with diabetic polyneuropathy: Secondary | ICD-10-CM | POA: Diagnosis present

## 2022-03-12 DIAGNOSIS — E669 Obesity, unspecified: Secondary | ICD-10-CM | POA: Diagnosis present

## 2022-03-12 LAB — VITAMIN D 25 HYDROXY (VIT D DEFICIENCY, FRACTURES): Vit D, 25-Hydroxy: 34.19 ng/mL (ref 30–100)

## 2022-03-12 LAB — BASIC METABOLIC PANEL
Anion gap: 11 (ref 5–15)
BUN: 25 mg/dL — ABNORMAL HIGH (ref 8–23)
CO2: 20 mmol/L — ABNORMAL LOW (ref 22–32)
Calcium: 8.4 mg/dL — ABNORMAL LOW (ref 8.9–10.3)
Chloride: 104 mmol/L (ref 98–111)
Creatinine, Ser: 1.22 mg/dL — ABNORMAL HIGH (ref 0.44–1.00)
GFR, Estimated: 46 mL/min — ABNORMAL LOW (ref >60)
Glucose, Bld: 181 mg/dL — ABNORMAL HIGH (ref 70–99)
Potassium: 3.4 mmol/L — ABNORMAL LOW (ref 3.5–5.1)
Sodium: 135 mmol/L (ref 135–145)

## 2022-03-12 LAB — GLUCOSE, CAPILLARY
Glucose-Capillary: 193 mg/dL — ABNORMAL HIGH (ref 70–99)
Glucose-Capillary: 168 mg/dL — ABNORMAL HIGH (ref 70–99)
Glucose-Capillary: 165 mg/dL — ABNORMAL HIGH (ref 70–99)
Glucose-Capillary: 181 mg/dL — ABNORMAL HIGH (ref 70–99)

## 2022-03-12 LAB — MRSA NEXT GEN BY PCR, NASAL: MRSA by PCR Next Gen: NOT DETECTED

## 2022-03-12 LAB — MAGNESIUM: Magnesium: 2 mg/dL (ref 1.7–2.4)

## 2022-03-12 MED ORDER — ACETAMINOPHEN 325 MG PO TABS: 650.0000 mg | ORAL_TABLET | Freq: Four times a day (QID) | ORAL | Status: DC | PRN

## 2022-03-12 MED ORDER — IPRATROPIUM-ALBUTEROL 0.5-2.5 (3) MG/3ML IN SOLN
3.0000 mL | Freq: Two times a day (BID) | RESPIRATORY_TRACT | Status: DC
  Administered 2022-03-12 – 2022-03-13 (×2): 3 mL via RESPIRATORY_TRACT
  Filled 2022-03-12 (×2): qty 3

## 2022-03-12 MED ORDER — GUAIFENESIN-DM 100-10 MG/5ML PO SYRP
5.0000 mL | ORAL_SOLUTION | ORAL | Status: DC | PRN
  Administered 2022-03-13: 5 mL via ORAL
  Filled 2022-03-12: qty 5

## 2022-03-12 MED ORDER — METHYLPREDNISOLONE SODIUM SUCC 125 MG IJ SOLR
80.0000 mg | INTRAMUSCULAR | Status: DC
  Administered 2022-03-12 – 2022-03-13 (×2): 80 mg via INTRAVENOUS
  Filled 2022-03-12 (×2): qty 2

## 2022-03-12 MED ORDER — POTASSIUM CHLORIDE CRYS ER 20 MEQ PO TBCR
40.0000 meq | EXTENDED_RELEASE_TABLET | Freq: Once | ORAL | Status: AC
  Administered 2022-03-12: 40 meq via ORAL
  Filled 2022-03-12: qty 2

## 2022-03-12 MED ORDER — IPRATROPIUM-ALBUTEROL 0.5-2.5 (3) MG/3ML IN SOLN: 3.0000 mL | Freq: Three times a day (TID) | RESPIRATORY_TRACT | Status: DC

## 2022-03-12 MED ORDER — IPRATROPIUM-ALBUTEROL 0.5-2.5 (3) MG/3ML IN SOLN
3.0000 mL | Freq: Three times a day (TID) | RESPIRATORY_TRACT | Status: DC
  Administered 2022-03-12: 3 mL via RESPIRATORY_TRACT
  Filled 2022-03-12: qty 3

## 2022-03-12 MED ORDER — INSULIN ASPART 100 UNIT/ML IJ SOLN
0.0000 [IU] | Freq: Three times a day (TID) | INTRAMUSCULAR | Status: DC
  Administered 2022-03-12 – 2022-03-13 (×4): 2 [IU] via SUBCUTANEOUS

## 2022-03-12 MED ORDER — ALBUTEROL SULFATE (2.5 MG/3ML) 0.083% IN NEBU: 2.5000 mg | INHALATION_SOLUTION | RESPIRATORY_TRACT | Status: DC | PRN

## 2022-03-12 MED ORDER — FUROSEMIDE 10 MG/ML IJ SOLN
40.0000 mg | Freq: Once | INTRAMUSCULAR | Status: AC
  Administered 2022-03-12: 40 mg via INTRAVENOUS
  Filled 2022-03-12: qty 4

## 2022-03-12 MED ORDER — INSULIN ASPART 100 UNIT/ML IJ SOLN: 0.0000 [IU] | Freq: Every day | INTRAMUSCULAR | Status: DC

## 2022-03-12 MED ORDER — ENOXAPARIN SODIUM 40 MG/0.4ML IJ SOSY
40.0000 mg | PREFILLED_SYRINGE | INTRAMUSCULAR | Status: DC
  Administered 2022-03-12: 40 mg via SUBCUTANEOUS
  Filled 2022-03-12: qty 0.4

## 2022-03-12 MED ORDER — ACETAMINOPHEN 650 MG RE SUPP: 650.0000 mg | Freq: Four times a day (QID) | RECTAL | Status: DC | PRN

## 2022-03-12 NOTE — TOC Progression Note (Signed)
Transition of Care [facility]) - Progression Note    Patient Details  Name: Vanessa Frank MRN: [id number] Date of Birth: 01-13-1946  Transition of Care [facility]) CM/SW Contact  Leone Haven, RN Phone Number: 03/12/2022, 4:07 PM  Clinical Narrative:    From home, RSV, SOB, cough, wheezing, congestion, conts on IV solumedrol, oxygen 2 liters.  TOC following.         Expected Discharge Plan and Services                                                 Social Determinants of Health (SDOH) Interventions    Readmission Risk Interventions     No data to display

## 2022-03-12 NOTE — ED Notes (Signed)
ED TO INPATIENT HANDOFF REPORT  ED Nurse Name and Phone #: Nils Pyle [phone] [place] Name/Age/Gender Vanessa Frank 76 y.o. female Room/Bed: MH08/MH08  Code Status   Code Status: Prior  Home/SNF/Other Home Patient oriented to: self, place, time, and situation Is this baseline? Yes   Triage Complete: Triage complete  Chief Complaint Acute hypoxic respiratory failure ([place]) [J96.01]  Triage Note Shob x 1 week. Reports exposure to RSV by grandson. Pt 90% on RA at rest.   Allergies Allergies  Allergen Reactions   Adhesive [Tape] Itching   Codeine Nausea Only   Latex Itching   Chocolate Rash    Level of Care/Admitting Diagnosis ED Disposition     ED Disposition  Admit   Condition  --   Comment  Hospital Area: [facility] [[id number]]  Level of Care: Telemetry Medical [104]  May admit patient to [facility] or [facility] if equivalent level of care is available:: Yes  Interfacility transfer: Yes  Covid Evaluation: Asymptomatic - no recent exposure (last 10 days) testing not required  Diagnosis: [id number]  Admitting Physician: Kayleen Memos [id number]  Attending Physician: Kayleen Memos [id number]  Certification:: I certify this patient will need inpatient services for at least 2 midnights  Estimated Length of Stay: 2          B Medical/Surgery History Past Medical History:  Diagnosis Date   Anxiety    Arthritis    Diabetes mellitus ORAL MEDS ONLY   Dysrhythmia IRREGULAR  RATE IN HER 20'S   Fibromyalgia    GERD (gastroesophageal reflux disease)    Headache(784.0)    Hypertension    Neuropathy    TOES   Palpitations    OCCASIONAL   Shortness of breath WITH EXERTION   Stress incontinence    Past Surgical History:  Procedure Laterality Date   ABDOMINAL HYSTERECTOMY  80'S   Partial    ANTERIOR LAT LUMBAR FUSION  03/26/2011   Procedure: ANTERIOR LATERAL LUMBAR FUSION 1 LEVEL;  Surgeon: Eustace Moore;  Location: [facility];  Service: Neurosurgery;  Laterality: Left;  Left Lumbar three-four anterolateral retroperitoneal interbody fusion    ARTHROSCOPY KNEE W/ DRILLING  09 LEFT   2012 RIGHT   BARTHOLIN GLAND CYST EXCISION  1983   CHOLECYSTECTOMY  1989   CYSTOSCOPY  80'S   FOOT SURGERY Right    SPINAL FUSION  2012   Lower Back   TOTAL KNEE ARTHROPLASTY Left 05/28/2013   Procedure: LEFT TOTAL KNEE ARTHROPLASTY;  Surgeon: Gearlean Alf, MD;  Location: [facility];  Service: Orthopedics;  Laterality: Left;   TOTAL KNEE ARTHROPLASTY Right 10/29/2013   Procedure: RIGHT TOTAL KNEE ARTHROPLASTY;  Surgeon: Gearlean Alf, MD;  Location: [facility];  Service: Orthopedics;  Laterality: Right;     A IV Location/Drains/Wounds Patient Lines/Drains/Airways Status     Active Line/Drains/Airways     Name Placement date Placement time Site Days   Peripheral IV 03/11/22 20 G Anterior;Left Forearm 03/11/22  1812  Forearm  1   Incision 03/26/11 Flank Left 03/26/11  0930  -- 4004   Incision 03/26/11 Back Other (Comment) 03/26/11  0930  -- 4004   Incision 05/28/13 Knee Left 05/28/13  0757  -- 3210   Incision (Closed) 10/29/13 Leg Right 10/29/13  1013  -- 3056            Intake/Output Last 24 hours No intake or output data in the 24  hours ending 03/12/22 0215  Labs/Imaging Results for orders placed or performed during the hospital encounter of 03/11/22 (from the past 48 hour(s))  CBC with Differential     Status: Abnormal   Collection Time: 03/11/22  5:49 PM  Result Value Ref Range   WBC 7.6 4.0 - 10.5 K/uL   RBC 3.71 (L) 3.87 - 5.11 MIL/uL   Hemoglobin 10.9 (L) 12.0 - 15.0 g/dL   HCT 40.3 (L) 47.4 - 25.9 %   MCV 88.1 80.0 - 100.0 fL   MCH 29.4 26.0 - 34.0 pg   MCHC 33.3 30.0 - 36.0 g/dL   RDW 56.3 87.5 - 64.3 %   Platelets 196 150 - 400 K/uL   nRBC 0.0 0.0 - 0.2 %   Neutrophils Relative % 69 %   Neutro Abs 5.3 1.7 - 7.7 K/uL   Lymphocytes Relative 22 %   Lymphs Abs 1.7 0.7 - 4.0 K/uL    Monocytes Relative 9 %   Monocytes Absolute 0.7 0.1 - 1.0 K/uL   Eosinophils Relative 0 %   Eosinophils Absolute 0.0 0.0 - 0.5 K/uL   Basophils Relative 0 %   Basophils Absolute 0.0 0.0 - 0.1 K/uL   Immature Granulocytes 0 %   Abs Immature Granulocytes 0.03 0.00 - 0.07 K/uL    Comment: Performed at [facility], [address] [place], [place] [address]  Comprehensive metabolic panel     Status: Abnormal   Collection Time: 03/11/22  5:49 PM  Result Value Ref Range   Sodium 135 135 - 145 mmol/L   Potassium 3.3 (L) 3.5 - 5.1 mmol/L   Chloride 105 98 - 111 mmol/L   CO2 21 (L) 22 - 32 mmol/L   Glucose, Bld 145 (H) 70 - 99 mg/dL    Comment: Glucose reference range applies only to samples taken after fasting for at least 8 hours.   BUN 25 (H) 8 - 23 mg/dL   Creatinine, Ser 8.84 (H) 0.44 - 1.00 mg/dL   Calcium 8.0 (L) 8.9 - 10.3 mg/dL   Total Protein 7.0 6.5 - 8.1 g/dL   Albumin 3.9 3.5 - 5.0 g/dL   AST 43 (H) 15 - 41 U/L   ALT 31 0 - 44 U/L   Alkaline Phosphatase 61 38 - 126 U/L   Total Bilirubin 0.7 0.3 - 1.2 mg/dL   GFR, Estimated 47 (L) >60 mL/min    Comment: (NOTE) Calculated using the CKD-EPI Creatinine Equation (2021)    Anion gap 9 5 - 15    Comment: Performed at [facility], [address] [place], [place] [address]  Resp Panel by RT-PCR (Flu A&B, Covid) Anterior Nasal Swab     Status: None   Collection Time: 03/11/22  5:49 PM   Specimen: Anterior Nasal Swab  Result Value Ref Range   SARS Coronavirus 2 by RT PCR NEGATIVE NEGATIVE    Comment: (NOTE) SARS-CoV-2 target nucleic acids are NOT DETECTED.  The SARS-CoV-2 RNA is generally detectable in upper respiratory specimens during the acute phase of infection. The lowest concentration of SARS-CoV-2 viral copies this assay can detect is 138 copies/mL. A negative result does not preclude SARS-Cov-2 infection and should not be used as the sole basis for treatment or other patient management  decisions. A negative result may occur with  improper specimen collection/handling, submission of specimen other than nasopharyngeal swab, presence of viral mutation(s) within the areas targeted by this assay, and inadequate number of viral  copies(<138 copies/mL). A negative result must be combined with clinical observations, patient history, and epidemiological information. The expected result is Negative.  Fact Sheet for Patients:  EntrepreneurPulse.com.au  Fact Sheet for Healthcare Providers:  IncredibleEmployment.be  This test is no t yet approved or cleared by the [place] FDA and  has been authorized for detection and/or diagnosis of SARS-CoV-2 by FDA under an Emergency Use Authorization (EUA). This EUA will remain  in effect (meaning this test can be used) for the duration of the COVID-19 declaration under Section 564(b)(1) of the Act, 21 U.S.C.section 360bbb-3(b)(1), unless the authorization is terminated  or revoked sooner.       Influenza A by PCR NEGATIVE NEGATIVE   Influenza B by PCR NEGATIVE NEGATIVE    Comment: (NOTE) The Xpert Xpress SARS-CoV-2/FLU/RSV plus assay is intended as an aid in the diagnosis of influenza from Nasopharyngeal swab specimens and should not be used as a sole basis for treatment. Nasal washings and aspirates are unacceptable for Xpert Xpress SARS-CoV-2/FLU/RSV testing.  Fact Sheet for Patients: EntrepreneurPulse.com.au  Fact Sheet for Healthcare Providers: IncredibleEmployment.be  This test is not yet approved or cleared by the [place] FDA and has been authorized for detection and/or diagnosis of SARS-CoV-2 by FDA under an Emergency Use Authorization (EUA). This EUA will remain in effect (meaning this test can be used) for the duration of the COVID-19 declaration under Section 564(b)(1) of the Act, 21 U.S.C. section 360bbb-3(b)(1), unless the authorization  is terminated or revoked.  Performed at [facility], [address] [place], [place] [address]   Troponin I (High Sensitivity)     Status: Abnormal   Collection Time: 03/11/22  5:49 PM  Result Value Ref Range   Troponin I (High Sensitivity) 24 (H) <18 ng/L    Comment: (NOTE) Elevated high sensitivity troponin I (hsTnI) values and significant  changes across serial measurements may suggest ACS but many other  chronic and acute conditions are known to elevate hsTnI results.  Refer to the "Links" section for chest pain algorithms and additional  guidance. Performed at [facility], [address] [place], [place] [address]   Brain natriuretic peptide     Status: Abnormal   Collection Time: 03/11/22  5:49 PM  Result Value Ref Range   B Natriuretic Peptide 153.7 (H) 0.0 - 100.0 pg/mL    Comment: Performed at [facility], [address] [place], [place] [address]  Troponin I (High Sensitivity)     Status: Abnormal   Collection Time: 03/11/22  8:38 PM  Result Value Ref Range   Troponin I (High Sensitivity) 19 (H) <18 ng/L    Comment: (NOTE) Elevated high sensitivity troponin I (hsTnI) values and significant  changes across serial measurements may suggest ACS but many other  chronic and acute conditions are known to elevate hsTnI results.  Refer to the "Links" section for chest pain algorithms and additional  guidance. Performed at [facility], [address] [place], [place] [address]   Respiratory (~20 pathogens) panel by PCR     Status: Abnormal   Collection Time: 03/11/22  8:38 PM   Specimen: Nasopharyngeal Swab; Respiratory  Result Value Ref Range   Adenovirus NOT DETECTED NOT DETECTED   Coronavirus 229E NOT DETECTED NOT DETECTED    Comment: (NOTE) The Coronavirus on the Respiratory Panel, DOES NOT test for the novel  Coronavirus (2019 nCoV)    Coronavirus HKU1 NOT DETECTED NOT DETECTED  Coronavirus NL63 NOT DETECTED NOT  DETECTED   Coronavirus OC43 NOT DETECTED NOT DETECTED   Metapneumovirus NOT DETECTED NOT DETECTED   Rhinovirus / Enterovirus NOT DETECTED NOT DETECTED   Influenza A NOT DETECTED NOT DETECTED   Influenza B NOT DETECTED NOT DETECTED   Parainfluenza Virus 1 NOT DETECTED NOT DETECTED   Parainfluenza Virus 2 NOT DETECTED NOT DETECTED   Parainfluenza Virus 3 NOT DETECTED NOT DETECTED   Parainfluenza Virus 4 NOT DETECTED NOT DETECTED   Respiratory Syncytial Virus DETECTED (A) NOT DETECTED   Bordetella pertussis NOT DETECTED NOT DETECTED   Bordetella Parapertussis NOT DETECTED NOT DETECTED   Chlamydophila pneumoniae NOT DETECTED NOT DETECTED   Mycoplasma pneumoniae NOT DETECTED NOT DETECTED    Comment: Performed at [facility], [place] [address], [place], [place] [address]   DG Chest Port 1 View  Result Date: 03/11/2022 CLINICAL DATA:  Shortness of breath EXAM: PORTABLE CHEST 1 VIEW COMPARISON:  07/04/2020 FINDINGS: The heart size and mediastinal contours are within normal limits. Both lungs are clear. The visualized skeletal structures are unremarkable. IMPRESSION: No acute abnormality of the lungs in AP portable projection. Electronically Signed   By: Delanna Ahmadi M.D.   On: 03/11/2022 18:51    Pending Labs Unresulted Labs (From admission, onward)    None       Vitals/Pain Today's Vitals   03/11/22 1900 03/11/22 2145 03/11/22 2200 03/12/22 0005  BP: (!) 128/58 129/64 134/60 127/66  Pulse: 100 87 84 83  Resp: (!) 21 16 18 18   Temp:  99.1 F (37.3 C)  99.2 F (37.3 C)  TempSrc:    Oral  SpO2: 92% 93% 92% 99%  Weight:      Height:      PainSc:  0-No pain  0-No pain    Isolation Precautions Droplet precaution  Medications Medications  albuterol (PROVENTIL) (2.5 MG/3ML) 0.083% nebulizer solution 5 mg (5 mg Nebulization Given 03/11/22 1838)  ipratropium (ATROVENT) nebulizer solution 0.5 mg (0.5 mg Nebulization Given 03/11/22 1838)  methylPREDNISolone sodium succinate  (SOLU-MEDROL) 125 mg/2 mL injection 125 mg (125 mg Intravenous Given 03/11/22 1841)    Mobility walks Low fall risk   Focused Assessments Cardiac Assessment Handoff:    No results found for: "CKTOTAL", "CKMB", "CKMBINDEX", "TROPONINI" Lab Results  Component Value Date   DDIMER 0.47 07/05/2020   Does the Patient currently have chest pain? No   , Pulmonary Assessment Handoff:  Lung sounds: Bilateral Breath Sounds: Rhonchi L Breath Sounds: Rhonchi R Breath Sounds: Rhonchi O2 Device: Nasal Cannula O2 Flow Rate (L/min): 2 L/min    R Recommendations: See Admitting Provider Note  Report given to:   Additional Notes: none

## 2022-03-12 NOTE — H&P (Signed)
History and Physical    Vanessa Frank YQM:[id number] DOB: [birth year] DOA: 03/11/2022  PCP: Lorenda Ishihara, MD  Patient coming from: [facility] ED  Chief Complaint: Shortness of breath  HPI: Vanessa Frank is a 76 y.o. female with medical history significant of hypertension, hyperlipidemia, type 2 diabetes, minimal nonobstructive CAD per cath 07/2020, obesity (BMI 35.43), overactive bladder, stress urinary incontinence, no known history of COPD or asthma presented to the ED with complaints of shortness of breath for a few days with direct exposure to RSV from her grandson who visited her at home.  Pulse ox in the mid 80s at home which prompted her to come to the ED for further evaluation.  In the ED, patient noted to be hypoxic with oxygen saturation 89% on room air and placed on 2 L O2.  Afebrile.  Audible wheezing on exam.  Chest x-ray negative for acute finding.  Labs showing no leukocytosis, hemoglobin 10.9 (no significant change from baseline), potassium 3.3, bicarb 21, glucose 145, creatinine 1.2 (at baseline), calcium 8.0, albumin 3.9, COVID and flu negative, BNP 153, troponin 24> 19, RSV PCR positive.  She received albuterol and Atrovent neb treatments, Solu-Medrol 125 mg.  Transferred to [facility].  Patient states she was recently exposed to her grandson who tested positive for RSV.  For the past 4 days she has been feeling poorly and experiencing shortness of breath, cough, and congestion.  She thinks her daughter checked her temperature at home and it was 35 F but she is not sure.  No other complaints.  Denies chest pain, nausea, vomiting, or abdominal pain.  Review of Systems:  Review of Systems  All other systems reviewed and are negative.   Past Medical History:  Diagnosis Date   Anxiety    Arthritis    Diabetes mellitus ORAL MEDS ONLY   Dysrhythmia IRREGULAR  RATE IN HER 20'S   Fibromyalgia    GERD (gastroesophageal reflux disease)    Headache(784.0)     Hypertension    Neuropathy    TOES   Palpitations    OCCASIONAL   Shortness of breath WITH EXERTION   Stress incontinence     Past Surgical History:  Procedure Laterality Date   ABDOMINAL HYSTERECTOMY  80'S   Partial    ANTERIOR LAT LUMBAR FUSION  03/26/2011   Procedure: ANTERIOR LATERAL LUMBAR FUSION 1 LEVEL;  Surgeon: Tia Alert;  Location: [facility];  Service: Neurosurgery;  Laterality: Left;  Left Lumbar three-four anterolateral retroperitoneal interbody fusion    ARTHROSCOPY KNEE W/ DRILLING  09 LEFT   2012 RIGHT   BARTHOLIN GLAND CYST EXCISION  1983   CHOLECYSTECTOMY  1989   CYSTOSCOPY  80'S   FOOT SURGERY Right    SPINAL FUSION  2012   Lower Back   TOTAL KNEE ARTHROPLASTY Left 05/28/2013   Procedure: LEFT TOTAL KNEE ARTHROPLASTY;  Surgeon: Loanne Drilling, MD;  Location: [facility];  Service: Orthopedics;  Laterality: Left;   TOTAL KNEE ARTHROPLASTY Right 10/29/2013   Procedure: RIGHT TOTAL KNEE ARTHROPLASTY;  Surgeon: Loanne Drilling, MD;  Location: [facility];  Service: Orthopedics;  Laterality: Right;     reports that she has never smoked. She has never used smokeless tobacco. She reports that she does not drink alcohol and does not use drugs.  Allergies  Allergen Reactions   Adhesive [Tape] Itching   Codeine Nausea Only   Latex Itching   Chocolate Rash    Family History  Problem Relation  Age of Onset   Depression Mother    Congestive Heart Failure Mother    Allergy (severe) Mother    Heart disease Mother    Alcoholism Mother    Hypertension Mother    Osteoarthritis Mother    Congestive Heart Failure Father    Cancer Father    Depression Maternal Grandmother    Osteoarthritis Maternal Grandmother    Depression Sister    Rheum arthritis Paternal Grandmother    Osteoarthritis Paternal Grandmother    Osteoarthritis Sister    Clotting disorder Sister     Prior to Admission medications   Medication Sig Start Date End Date Taking? Authorizing Provider   atorvastatin (LIPITOR) 10 MG tablet Take 10 mg by mouth at bedtime.    [provider]  B Complex Vitamins (VITAMIN B COMPLEX) TABS Take 1 tablet by mouth daily.    [provider]  carboxymethylcellulose (REFRESH PLUS) 0.5 % SOLN Place 1 drop into both eyes 3 (three) times daily as needed (dry eyes).    [provider]  ELDERBERRY PO Take 1 capsule by mouth daily.    [provider]  furosemide (LASIX) 40 MG tablet Take 40 mg by mouth once as needed.    [provider]  lisinopril (PRINIVIL,ZESTRIL) 10 MG tablet Take 10 mg by mouth every morning.    [provider]  melatonin 5 MG TABS Take 5 mg by mouth at bedtime.    [provider]  metFORMIN (GLUCOPHAGE) 1000 MG tablet Take 1,000 mg by mouth 2 (two) times daily with a meal.    [provider]  mirabegron ER (MYRBETRIQ) 25 MG TB24 tablet Take 1 tablet (25 mg total) by mouth daily. 02/23/22   Marguerita BeardsSchroeder, Michelle N, MD  Multiple Vitamin (MULTIVITAMIN) capsule Take 1 capsule by mouth daily.    [provider]  oxyCODONE-acetaminophen (PERCOCET) 10-325 MG tablet Take 1-2 tablets by mouth daily as needed for pain. 06/15/17   [provider]  pioglitazone (ACTOS) 30 MG tablet TAKE 1 TABLET BY MOUTH EVERY MORNING Patient taking differently: Take 30 mg by mouth daily. 03/18/14   [name], [name], DO  sertraline (ZOLOFT) 100 MG tablet Take 100 mg by mouth every morning.    [provider]    Physical Exam: Vitals:   03/11/22 2145 03/11/22 2200 03/12/22 0005 03/12/22 0332  BP: 129/64 134/60 127/66 (!) 144/75  Pulse: 87 84 83 80  Resp: 16 18 18 17   Temp: 99.1 F (37.3 C)  99.2 F (37.3 C) 99 F (37.2 C)  TempSrc:   Oral Oral  SpO2: 93% 92% 99% 93%  Weight:    82.3 kg  Height:        Physical Exam Vitals reviewed.  Constitutional:      General: She is not in acute distress. HENT:     Head: Normocephalic and atraumatic.  Eyes:      Extraocular Movements: Extraocular movements intact.  Cardiovascular:     Rate and Rhythm: Normal rate and regular rhythm.     Pulses: Normal pulses.  Pulmonary:     Effort: Pulmonary effort is normal. No respiratory distress.     Breath sounds: Rhonchi present.  Abdominal:     General: Bowel sounds are normal. There is no distension.     Palpations: Abdomen is soft.     Tenderness: There is no abdominal tenderness.  Musculoskeletal:        General: No swelling or tenderness.     Cervical back:  Normal range of motion.  Skin:    General: Skin is warm and dry.  Neurological:     General: No focal deficit present.     Mental Status: She is alert and oriented to person, place, and time.     Labs on Admission: I have personally reviewed following labs and imaging studies  CBC: Recent Labs  Lab 03/11/22 1749  WBC 7.6  NEUTROABS 5.3  HGB 10.9*  HCT 32.7*  MCV 88.1  PLT 196   Basic Metabolic Panel: Recent Labs  Lab 03/11/22 1749  NA 135  K 3.3*  CL 105  CO2 21*  GLUCOSE 145*  BUN 25*  CREATININE 1.20*  CALCIUM 8.0*   GFR: Estimated Creatinine Clearance: 37.3 mL/min (A) (by C-G formula based on SCr of 1.2 mg/dL (H)). Liver Function Tests: Recent Labs  Lab 03/11/22 1749  AST 43*  ALT 31  ALKPHOS 61  BILITOT 0.7  PROT 7.0  ALBUMIN 3.9   No results for input(s): "LIPASE", "AMYLASE" in the last 168 hours. No results for input(s): "AMMONIA" in the last 168 hours. Coagulation Profile: No results for input(s): "INR", "PROTIME" in the last 168 hours. Cardiac Enzymes: No results for input(s): "CKTOTAL", "CKMB", "CKMBINDEX", "TROPONINI" in the last 168 hours. BNP (last 3 results) No results for input(s): "PROBNP" in the last 8760 hours. HbA1C: No results for input(s): "HGBA1C" in the last 72 hours. CBG: No results for input(s): "GLUCAP" in the last 168 hours. Lipid Profile: No results for input(s): "CHOL", "HDL", "LDLCALC", "TRIG", "CHOLHDL", "LDLDIRECT" in the  last 72 hours. Thyroid Function Tests: No results for input(s): "TSH", "T4TOTAL", "FREET4", "T3FREE", "THYROIDAB" in the last 72 hours. Anemia Panel: No results for input(s): "VITAMINB12", "FOLATE", "FERRITIN", "TIBC", "IRON", "RETICCTPCT" in the last 72 hours. Urine analysis:    Component Value Date/Time   COLORURINE YELLOW 10/23/2013 1524   APPEARANCEUR CLEAR 10/23/2013 1524   LABSPEC 1.021 10/23/2013 1524   PHURINE 5.0 10/23/2013 1524   GLUCOSEU NEGATIVE 10/23/2013 1524   HGBUR NEGATIVE 10/23/2013 1524   BILIRUBINUR Negative 02/23/2022 1341   KETONESUR NEGATIVE 10/23/2013 1524   PROTEINUR Positive (A) 02/23/2022 1341   PROTEINUR NEGATIVE 10/23/2013 1524   UROBILINOGEN 0.2 02/23/2022 1341   UROBILINOGEN 0.2 10/23/2013 1524   NITRITE Negative 02/23/2022 1341   NITRITE NEGATIVE 10/23/2013 1524   LEUKOCYTESUR Negative 02/23/2022 1341    Radiological Exams on Admission: DG Chest Port 1 View  Result Date: 03/11/2022 CLINICAL DATA:  Shortness of breath EXAM: PORTABLE CHEST 1 VIEW COMPARISON:  07/04/2020 FINDINGS: The heart size and mediastinal contours are within normal limits. Both lungs are clear. The visualized skeletal structures are unremarkable. IMPRESSION: No acute abnormality of the lungs in AP portable projection. Electronically Signed   By: Jearld Lesch M.D.   On: 03/11/2022 18:51    EKG: Independently reviewed.  Sinus rhythm, no significant change since prior tracing.  Assessment and Plan  Acute hypoxemic respiratory failure secondary to RSV infection RSV PCR positive.  Chest x-ray negative for acute finding.  Patient had wheezing on initial exam done in the ED, likely RSV associated bronchial reactivity.  No history of asthma or COPD.  She was given bronchodilator treatments and Solu-Medrol 125 mg.  Rhonchi appreciated on exam done at present and satting 91-92% on room air at rest. -Continue supportive care: Steroids, scheduled and as needed bronchodilator treatments,  antitussive as needed -Droplet and contact precautions -Supplemental oxygen as needed  Mild hypokalemia -Monitor potassium and magnesium levels, replace as needed.  Mild hypocalcemia -Check ionized calcium level, vitamin D, PTH, and mag  Type 2 diabetes -A1c -Sensitive sliding scale insulin ACHS -Hold oral hypoglycemic agents  Hypertension: Stable. Hyperlipidemia Overactive bladder -Pharmacy med rec pending.  DVT prophylaxis: Lovenox Code Status: Full Code (discussed with the patient) Family Communication: No family available at this time. Level of care: Telemetry bed Admission status: It is my clinical opinion that admission to INPATIENT is reasonable and necessary because of the expectation that this patient will require hospital care that crosses at least 2 midnights to treat this condition based on the medical complexity of the problems presented.  Given the aforementioned information, the predictability of an adverse outcome is felt to be significant.   John Giovanni MD [facility]  If 7PM-7AM, please contact night-coverage [email or web address]  03/12/2022, 3:53 AM

## 2022-03-12 NOTE — Progress Notes (Signed)
This is a pleasant 76 year old lady who presented to ED yesterday with shortness of breath and was eventually diagnosed with acute respiratory failure with hypoxia secondary to RSV infection.  No bacterial infection/infiltrate on the chest x-ray.  Patient feels better but is still is very wheezy and also has some rhonchi and crackles at the right base.  BNP was also elevated.  Patient does not carry any history of CHF however she takes Lasix once in a while.  I will give her 1 dose of Lasix IV 40 mg.  Continue supportive care and breathing treatments.  She had hypokalemia yesterday, repeating BMP today.  Magnesium is normal.  She also has CKD stage IIIa which is at her baseline.  Abdomen soft and nontender.  I spent 20 minutes on this encounter today.

## 2022-03-13 DIAGNOSIS — J9601 Acute respiratory failure with hypoxia: Secondary | ICD-10-CM | POA: Diagnosis not present

## 2022-03-13 LAB — BASIC METABOLIC PANEL
Anion gap: 13 (ref 5–15)
BUN: 33 mg/dL — ABNORMAL HIGH (ref 8–23)
CO2: 19 mmol/L — ABNORMAL LOW (ref 22–32)
Calcium: 8.6 mg/dL — ABNORMAL LOW (ref 8.9–10.3)
Chloride: 101 mmol/L (ref 98–111)
Creatinine, Ser: 1.21 mg/dL — ABNORMAL HIGH (ref 0.44–1.00)
GFR, Estimated: 46 mL/min — ABNORMAL LOW (ref >60)
Glucose, Bld: 203 mg/dL — ABNORMAL HIGH (ref 70–99)
Potassium: 3.9 mmol/L (ref 3.5–5.1)
Sodium: 133 mmol/L — ABNORMAL LOW (ref 135–145)

## 2022-03-13 LAB — GLUCOSE, CAPILLARY: Glucose-Capillary: 172 mg/dL — ABNORMAL HIGH (ref 70–99)

## 2022-03-13 LAB — HEMOGLOBIN A1C
Hgb A1c MFr Bld: 6.1 % — ABNORMAL HIGH (ref 4.8–5.6)
Mean Plasma Glucose: 128 mg/dL

## 2022-03-13 LAB — PARATHYROID HORMONE, INTACT (NO CA): PTH: 50 pg/mL (ref 15–65)

## 2022-03-13 LAB — CALCIUM, IONIZED: Calcium, Ionized, Serum: 4.8 mg/dL (ref 4.5–5.6)

## 2022-03-13 MED ORDER — OXYCODONE-ACETAMINOPHEN 5-325 MG PO TABS
1.0000 | ORAL_TABLET | Freq: Every day | ORAL | Status: DC | PRN
  Administered 2022-03-13: 1 via ORAL
  Filled 2022-03-13: qty 1

## 2022-03-13 MED ORDER — LISINOPRIL 10 MG PO TABS
10.0000 mg | ORAL_TABLET | Freq: Every morning | ORAL | Status: DC
  Administered 2022-03-13: 10 mg via ORAL
  Filled 2022-03-13: qty 1

## 2022-03-13 MED ORDER — SERTRALINE HCL 100 MG PO TABS
100.0000 mg | ORAL_TABLET | Freq: Every morning | ORAL | Status: DC
  Administered 2022-03-13: 100 mg via ORAL
  Filled 2022-03-13: qty 1

## 2022-03-13 MED ORDER — POLYETHYLENE GLYCOL 3350 17 G PO PACK: 17.0000 g | PACK | Freq: Every day | ORAL | Status: DC | PRN

## 2022-03-13 MED ORDER — SALINE SPRAY 0.65 % NA SOLN
1.0000 | NASAL | Status: DC | PRN
  Administered 2022-03-13: 1 via NASAL
  Filled 2022-03-13: qty 44

## 2022-03-13 MED ORDER — OXYCODONE HCL 5 MG PO TABS
5.0000 mg | ORAL_TABLET | Freq: Every day | ORAL | Status: DC | PRN
  Administered 2022-03-13: 5 mg via ORAL
  Filled 2022-03-13: qty 1

## 2022-03-13 MED ORDER — MELATONIN 5 MG PO TABS
5.0000 mg | ORAL_TABLET | Freq: Every day | ORAL | Status: DC
  Administered 2022-03-13: 5 mg via ORAL
  Filled 2022-03-13: qty 1

## 2022-03-13 MED ORDER — OXYCODONE-ACETAMINOPHEN 10-325 MG PO TABS: 1.0000 | ORAL_TABLET | Freq: Every day | ORAL | Status: DC | PRN

## 2022-03-13 NOTE — Plan of Care (Signed)

## 2022-03-13 NOTE — Progress Notes (Addendum)
Mobility Specialist Progress Note    03/13/22 0923  Mobility  Activity Ambulated with assistance in hallway  Level of Assistance Contact guard assist, steadying assist  Assistive Device Front wheel walker  Distance Ambulated (ft) 300 ft  Activity Response Tolerated well  Mobility Referral Yes  $Mobility charge 1 Mobility   During Mobility: 116 HR, 92% SpO2 Post-Mobility: 107 HR, 94% SpO2  Pt received in bed and agreeable. No complaints. Tolerated on RA. Returned to sitting EOB with call bell in reach.   [place] Mobility Specialist  Please Neurosurgeon or Rehab Office at [phone]

## 2022-03-13 NOTE — Progress Notes (Signed)
Discharge instructions given to pt. Discharged home accompanied by daughter -in law. Belongings with pt.

## 2022-03-13 NOTE — Progress Notes (Signed)
Home meds have not been resumed and pt asking about why she did not get her medications that she normally takes at home. Reviewed home meds and notified Dr. Dow Adolph . Awaiting new orders and updated primary nurse.

## 2022-03-13 NOTE — Discharge Summary (Signed)
Physician Discharge Summary  Vanessa Frank XIP:[id number] DOB: 1945/08/05 DOA: 03/11/2022  PCP: Lorenda Ishihara, MD  Admit date: 03/11/2022 Discharge date: 03/13/2022 30 Day Unplanned Readmission Risk Score    Flowsheet Row ED to Hosp-Admission (Current) from 03/11/2022 in [facility] 2[facility] CV PROGRESSIVE CARE  30 Day Unplanned Readmission Risk Score (%) 17.19 Filed at 03/13/2022 0800       This score is the patient's risk of an unplanned readmission within 30 days of being discharged (0 -100%). The score is based on dignosis, age, lab data, medications, orders, and past utilization.   Low:  0-14.9   Medium: 15-21.9   High: 22-29.9   Extreme: 30 and above          Admitted From: Home Disposition: Home  Recommendations for Outpatient Follow-up:  Follow up with PCP in 1-2 weeks Please obtain BMP/CBC in one week Please follow up with your PCP on the following pending results: Unresulted Labs (From admission, onward)     Start     Ordered   03/12/22 0448  Parathyroid hormone, intact (no Ca)  Once,   R        03/12/22 0447   03/12/22 0448  Calcium, ionized  Once,   R        03/12/22 0447              Home Health: None Equipment/Devices: None  Discharge Condition: Stable CODE STATUS: Full code Diet recommendation: Cardiac  Subjective: Seen and Vanessa Frank.  She feels much better.  No complaints and she feels comfortable going home.  Brief/Interim Summary: This is a pleasant 76 year old lady who presented to ED yesterday with shortness of breath and was eventually diagnosed with acute respiratory failure with hypoxia secondary to RSV infection.  No bacterial infection/infiltrate on the chest x-ray.  She was treated with IV Solu-Medrol and bronchodilators.  She has been taken off of oxygen, blood extremity shows she was saturating 92% on room air with exertion.  She feels well, no wheezes on exam.  She does have rhonchi which is secondary to viral pneumonia. She is a stable  and is being discharged home.  Discharge plan was discussed with patient and/or family member and they verbalized understanding and agreed with it.  Discharge Diagnoses:  Principal Problem:   Acute hypoxic respiratory failure (HCC) Active Problems:   Hyperlipidemia   Hypertension   Diabetes mellitus (HCC)   RSV infection   Hypokalemia   Hypocalcemia   Overactive bladder    Discharge Instructions   Allergies as of 03/13/2022       Reactions   Adhesive [tape] Itching   Codeine Nausea Only   Can tolerate with antinausea meds   Latex Itching   Chocolate Rash   Per allergy Test  - Pt has not experienced any symptoms         Medication List     TAKE these medications    atorvastatin 10 MG tablet Commonly known as: LIPITOR Take 10 mg by mouth at bedtime.   carboxymethylcellulose 0.5 % Soln Commonly known as: REFRESH PLUS Place 1 drop into both eyes 3 (three) times daily as needed (dry eyes).   ELDERBERRY PO Take 1 each by mouth every other day.   leflunomide 20 MG tablet Commonly known as: ARAVA Take 20 mg by mouth daily.   lisinopril 10 MG tablet Commonly known as: ZESTRIL Take 10 mg by mouth every morning.   melatonin 5 MG Tabs Take 5 mg by mouth at bedtime.  metFORMIN 500 MG 24 hr tablet Commonly known as: GLUCOPHAGE-XR Take 500 mg by mouth 2 (two) times daily with a meal.   mirabegron ER 25 MG Tb24 tablet Commonly known as: MYRBETRIQ Take 1 tablet (25 mg total) by mouth daily.   multivitamin tablet Take 1 tablet by mouth 3 (three) times a week.   oxyCODONE-acetaminophen 10-325 MG tablet Commonly known as: PERCOCET Take 1-2 tablets by mouth daily as needed for pain.   pioglitazone 30 MG tablet Commonly known as: ACTOS TAKE 1 TABLET BY MOUTH EVERY MORNING What changed: when to take this   sertraline 100 MG tablet Commonly known as: ZOLOFT Take 100 mg by mouth every morning.   Vitamin B Complex Tabs Take 1 tablet by mouth 3 (three) times  a week.        Follow-up Information     Lorenda Ishihara, MD Follow up in 1 week(s).   Specialty: Internal Medicine Contact information: [address] [place] [address] [phone]                Allergies  Allergen Reactions   Adhesive [Tape] Itching   Codeine Nausea Only    Can tolerate with antinausea meds   Latex Itching   Chocolate Rash    Per allergy Test  - Pt has not experienced any symptoms     Consultations: None   Procedures/Studies: DG Chest Port 1 View  Result Date: 03/11/2022 CLINICAL DATA:  Shortness of breath EXAM: PORTABLE CHEST 1 VIEW COMPARISON:  07/04/2020 FINDINGS: The heart size and mediastinal contours are within normal limits. Both lungs are clear. The visualized skeletal structures are unremarkable. IMPRESSION: No acute abnormality of the lungs in AP portable projection. Electronically Signed   By: Jearld Lesch M.D.   On: 03/11/2022 18:51     Discharge Exam: Vitals:   03/13/22 0457 03/13/22 0700  BP: 110/64   Pulse: 77   Resp: 20   Temp: 98.9 F (37.2 C) 98.7 F (37.1 C)  SpO2: 90%    Vitals:   03/12/22 2116 03/13/22 0020 03/13/22 0457 03/13/22 0700  BP:  (!) 163/67 110/64   Pulse:  83 77   Resp:  19 20   Temp:  98.4 F (36.9 C) 98.9 F (37.2 C) 98.7 F (37.1 C)  TempSrc:  Oral Oral Oral  SpO2: 95% 94% 90%   Weight:      Height:        General: Pt is alert, awake, not in acute distress Cardiovascular: RRR, S1/S2 +, no rubs, no gallops Respiratory: Mild rhonchi at the bases bilaterally.  No wheezes. Abdominal: Soft, NT, ND, bowel sounds + Extremities: no edema, no cyanosis    The results of significant diagnostics from this hospitalization (including imaging, microbiology, ancillary and laboratory) are listed below for reference.     Microbiology: Recent Results (from the past 240 hour(s))  Resp Panel by RT-PCR (Flu A&B, Covid) Anterior Nasal Swab     Status: None   Collection Time:  03/11/22  5:49 PM   Specimen: Anterior Nasal Swab  Result Value Ref Range Status   SARS Coronavirus 2 by RT PCR NEGATIVE NEGATIVE Final    Comment: (NOTE) SARS-CoV-2 target nucleic acids are NOT DETECTED.  The SARS-CoV-2 RNA is generally detectable in upper respiratory specimens during the acute phase of infection. The lowest concentration of SARS-CoV-2 viral copies this assay can detect is 138 copies/mL. A negative result does not preclude SARS-Cov-2 infection and should not be used  as the sole basis for treatment or other patient management decisions. A negative result may occur with  improper specimen collection/handling, submission of specimen other than nasopharyngeal swab, presence of viral mutation(s) within the areas targeted by this assay, and inadequate number of viral copies(<138 copies/mL). A negative result must be combined with clinical observations, patient history, and epidemiological information. The expected result is Negative.  Fact Sheet for Patients:  BloggerCourse.comhttps://www.fda.gov/media/152166/download  Fact Sheet for Healthcare Providers:  SeriousBroker.ithttps://www.fda.gov/media/152162/download  This test is no t yet approved or cleared by the [place] FDA and  has been authorized for detection and/or diagnosis of SARS-CoV-2 by FDA under an Emergency Use Authorization (EUA). This EUA will remain  in effect (meaning this test can be used) for the duration of the COVID-19 declaration under Section 564(b)(1) of the Act, 21 U.S.C.section 360bbb-3(b)(1), unless the authorization is terminated  or revoked sooner.       Influenza A by PCR NEGATIVE NEGATIVE Final   Influenza B by PCR NEGATIVE NEGATIVE Final    Comment: (NOTE) The Xpert Xpress SARS-CoV-2/FLU/RSV plus assay is intended as an aid in the diagnosis of influenza from Nasopharyngeal swab specimens and should not be used as a sole basis for treatment. Nasal washings and aspirates are unacceptable for Xpert Xpress  SARS-CoV-2/FLU/RSV testing.  Fact Sheet for Patients: BloggerCourse.comhttps://www.fda.gov/media/152166/download  Fact Sheet for Healthcare Providers: SeriousBroker.ithttps://www.fda.gov/media/152162/download  This test is not yet approved or cleared by the [place] FDA and has been authorized for detection and/or diagnosis of SARS-CoV-2 by FDA under an Emergency Use Authorization (EUA). This EUA will remain in effect (meaning this test can be used) for the duration of the COVID-19 declaration under Section 564(b)(1) of the Act, 21 U.S.C. section 360bbb-3(b)(1), unless the authorization is terminated or revoked.  Performed at [facility], [address] [place], [place] [address]   Respiratory (~20 pathogens) panel by PCR     Status: Abnormal   Collection Time: 03/11/22  8:38 PM   Specimen: Nasopharyngeal Swab; Respiratory  Result Value Ref Range Status   Adenovirus NOT DETECTED NOT DETECTED Final   Coronavirus 229E NOT DETECTED NOT DETECTED Final    Comment: (NOTE) The Coronavirus on the Respiratory Panel, DOES NOT test for the novel  Coronavirus (2019 nCoV)    Coronavirus HKU1 NOT DETECTED NOT DETECTED Final   Coronavirus NL63 NOT DETECTED NOT DETECTED Final   Coronavirus OC43 NOT DETECTED NOT DETECTED Final   Metapneumovirus NOT DETECTED NOT DETECTED Final   Rhinovirus / Enterovirus NOT DETECTED NOT DETECTED Final   Influenza A NOT DETECTED NOT DETECTED Final   Influenza B NOT DETECTED NOT DETECTED Final   Parainfluenza Virus 1 NOT DETECTED NOT DETECTED Final   Parainfluenza Virus 2 NOT DETECTED NOT DETECTED Final   Parainfluenza Virus 3 NOT DETECTED NOT DETECTED Final   Parainfluenza Virus 4 NOT DETECTED NOT DETECTED Final   Respiratory Syncytial Virus DETECTED (A) NOT DETECTED Final   Bordetella pertussis NOT DETECTED NOT DETECTED Final   Bordetella Parapertussis NOT DETECTED NOT DETECTED Final   Chlamydophila pneumoniae NOT DETECTED NOT DETECTED Final   Mycoplasma pneumoniae NOT  DETECTED NOT DETECTED Final    Comment: Performed at [facility], [address] [place], [place] [address]  MRSA Next Gen by PCR, Nasal     Status: None   Collection Time: 03/12/22  3:45 AM   Specimen: Nasal Mucosa; Nasal Swab  Result Value Ref Range Status   MRSA by PCR Next Gen NOT  DETECTED NOT DETECTED Final    Comment: (NOTE) The GeneXpert MRSA Assay (FDA approved for NASAL specimens only), is one component of a comprehensive MRSA colonization surveillance program. It is not intended to diagnose MRSA infection nor to guide or monitor treatment for MRSA infections. Test performance is not FDA approved in patients less than 17 years old. Performed at [facility], [address], [place], [place] [address]      Labs: BNP (last 3 results) Recent Labs    03/11/22 1749  BNP 153.7*   Basic Metabolic Panel: Recent Labs  Lab 03/11/22 1749 03/12/22 0705 03/13/22 0745  NA 135 135 133*  K 3.3* 3.4* 3.9  CL 105 104 101  CO2 21* 20* 19*  GLUCOSE 145* 181* 203*  BUN 25* 25* 33*  CREATININE 1.20* 1.22* 1.21*  CALCIUM 8.0* 8.4* 8.6*  MG  --  2.0  --    Liver Function Tests: Recent Labs  Lab 03/11/22 1749  AST 43*  ALT 31  ALKPHOS 61  BILITOT 0.7  PROT 7.0  ALBUMIN 3.9   No results for input(s): "LIPASE", "AMYLASE" in the last 168 hours. No results for input(s): "AMMONIA" in the last 168 hours. CBC: Recent Labs  Lab 03/11/22 1749  WBC 7.6  NEUTROABS 5.3  HGB 10.9*  HCT 32.7*  MCV 88.1  PLT 196   Cardiac Enzymes: No results for input(s): "CKTOTAL", "CKMB", "CKMBINDEX", "TROPONINI" in the last 168 hours. BNP: Invalid input(s): "POCBNP" CBG: Recent Labs  Lab 03/12/22 0627 03/12/22 1106 03/12/22 1651 03/12/22 2113 03/13/22 0612  GLUCAP 193* 168* 165* 181* 172*   D-Dimer No results for input(s): "DDIMER" in the last 72 hours. Hgb A1c Recent Labs    03/12/22 0705  HGBA1C 6.1*   Lipid Profile No results for input(s): "CHOL", "HDL",  "LDLCALC", "TRIG", "CHOLHDL", "LDLDIRECT" in the last 72 hours. Thyroid function studies No results for input(s): "TSH", "T4TOTAL", "T3FREE", "THYROIDAB" in the last 72 hours.  Invalid input(s): "FREET3" Anemia work up No results for input(s): "VITAMINB12", "FOLATE", "FERRITIN", "TIBC", "IRON", "RETICCTPCT" in the last 72 hours. Urinalysis    Component Value Date/Time   COLORURINE YELLOW 10/23/2013 1524   APPEARANCEUR CLEAR 10/23/2013 1524   LABSPEC 1.021 10/23/2013 1524   PHURINE 5.0 10/23/2013 1524   GLUCOSEU NEGATIVE 10/23/2013 1524   HGBUR NEGATIVE 10/23/2013 1524   BILIRUBINUR Negative 02/23/2022 1341   KETONESUR NEGATIVE 10/23/2013 1524   PROTEINUR Positive (A) 02/23/2022 1341   PROTEINUR NEGATIVE 10/23/2013 1524   UROBILINOGEN 0.2 02/23/2022 1341   UROBILINOGEN 0.2 10/23/2013 1524   NITRITE Negative 02/23/2022 1341   NITRITE NEGATIVE 10/23/2013 1524   LEUKOCYTESUR Negative 02/23/2022 1341   Sepsis Labs Recent Labs  Lab 03/11/22 1749  WBC 7.6   Microbiology Recent Results (from the past 240 hour(s))  Resp Panel by RT-PCR (Flu A&B, Covid) Anterior Nasal Swab     Status: None   Collection Time: 03/11/22  5:49 PM   Specimen: Anterior Nasal Swab  Result Value Ref Range Status   SARS Coronavirus 2 by RT PCR NEGATIVE NEGATIVE Final    Comment: (NOTE) SARS-CoV-2 target nucleic acids are NOT DETECTED.  The SARS-CoV-2 RNA is generally detectable in upper respiratory specimens during the acute phase of infection. The lowest concentration of SARS-CoV-2 viral copies this assay can detect is 138 copies/mL. A negative result does not preclude SARS-Cov-2 infection and should not be used as the sole basis for treatment or other patient management decisions. A negative result may occur  with  improper specimen collection/handling, submission of specimen other than nasopharyngeal swab, presence of viral mutation(s) within the areas targeted by this assay, and inadequate number of  viral copies(<138 copies/mL). A negative result must be combined with clinical observations, patient history, and epidemiological information. The expected result is Negative.  Fact Sheet for Patients:  BloggerCourse.com  Fact Sheet for Healthcare Providers:  SeriousBroker.it  This test is no t yet approved or cleared by the [place] FDA and  has been authorized for detection and/or diagnosis of SARS-CoV-2 by FDA under an Emergency Use Authorization (EUA). This EUA will remain  in effect (meaning this test can be used) for the duration of the COVID-19 declaration under Section 564(b)(1) of the Act, 21 U.S.C.section 360bbb-3(b)(1), unless the authorization is terminated  or revoked sooner.       Influenza A by PCR NEGATIVE NEGATIVE Final   Influenza B by PCR NEGATIVE NEGATIVE Final    Comment: (NOTE) The Xpert Xpress SARS-CoV-2/FLU/RSV plus assay is intended as an aid in the diagnosis of influenza from Nasopharyngeal swab specimens and should not be used as a sole basis for treatment. Nasal washings and aspirates are unacceptable for Xpert Xpress SARS-CoV-2/FLU/RSV testing.  Fact Sheet for Patients: BloggerCourse.com  Fact Sheet for Healthcare Providers: SeriousBroker.it  This test is not yet approved or cleared by the [place] FDA and has been authorized for detection and/or diagnosis of SARS-CoV-2 by FDA under an Emergency Use Authorization (EUA). This EUA will remain in effect (meaning this test can be used) for the duration of the COVID-19 declaration under Section 564(b)(1) of the Act, 21 U.S.C. section 360bbb-3(b)(1), unless the authorization is terminated or revoked.  Performed at [facility], [address] [place], [place] [address]   Respiratory (~20 pathogens) panel by PCR     Status: Abnormal   Collection Time: 03/11/22  8:38 PM    Specimen: Nasopharyngeal Swab; Respiratory  Result Value Ref Range Status   Adenovirus NOT DETECTED NOT DETECTED Final   Coronavirus 229E NOT DETECTED NOT DETECTED Final    Comment: (NOTE) The Coronavirus on the Respiratory Panel, DOES NOT test for the novel  Coronavirus (2019 nCoV)    Coronavirus HKU1 NOT DETECTED NOT DETECTED Final   Coronavirus NL63 NOT DETECTED NOT DETECTED Final   Coronavirus OC43 NOT DETECTED NOT DETECTED Final   Metapneumovirus NOT DETECTED NOT DETECTED Final   Rhinovirus / Enterovirus NOT DETECTED NOT DETECTED Final   Influenza A NOT DETECTED NOT DETECTED Final   Influenza B NOT DETECTED NOT DETECTED Final   Parainfluenza Virus 1 NOT DETECTED NOT DETECTED Final   Parainfluenza Virus 2 NOT DETECTED NOT DETECTED Final   Parainfluenza Virus 3 NOT DETECTED NOT DETECTED Final   Parainfluenza Virus 4 NOT DETECTED NOT DETECTED Final   Respiratory Syncytial Virus DETECTED (A) NOT DETECTED Final   Bordetella pertussis NOT DETECTED NOT DETECTED Final   Bordetella Parapertussis NOT DETECTED NOT DETECTED Final   Chlamydophila pneumoniae NOT DETECTED NOT DETECTED Final   Mycoplasma pneumoniae NOT DETECTED NOT DETECTED Final    Comment: Performed at [facility], [address], [place], [place] [address]  MRSA Next Gen by PCR, Nasal     Status: None   Collection Time: 03/12/22  3:45 AM   Specimen: Nasal Mucosa; Nasal Swab  Result Value Ref Range Status   MRSA by PCR Next Gen NOT DETECTED NOT DETECTED Final    Comment: (NOTE) The GeneXpert MRSA Assay (FDA approved for  NASAL specimens only), is one component of a comprehensive MRSA colonization surveillance program. It is not intended to diagnose MRSA infection nor to guide or monitor treatment for MRSA infections. Test performance is not FDA approved in patients less than 25 years old. Performed at [facility], [address], [place], [place] [address]      Time coordinating discharge: Over 30  minutes  SIGNED:   Hughie Closs, MD  [facility] 03/13/2022, 9:54 AM *Please note that this is a verbal dictation therefore any spelling or grammatical errors are due to the "Dragon Medical One" system interpretation. If 7PM-7AM, plShe is a stable and is being discharged home.ease contact night-coverage [email or web address]

## 2022-03-13 NOTE — Progress Notes (Signed)
Nurse requested Mobility Specialist to perform oxygen saturation test with pt which includes removing pt from oxygen both at rest and while ambulating.  Below are the results from that testing.     Patient Saturations on Room Air at Rest = spO2 94%  Patient Saturations on Room Air while Ambulating = sp02 92% .    At end of testing pt left in room on RA.  Reported results to nurse.

## 2022-03-15 ENCOUNTER — Emergency Department (HOSPITAL_BASED_OUTPATIENT_CLINIC_OR_DEPARTMENT_OTHER)
Admission: EM | Admit: 2022-03-15 | Discharge: 2022-03-15 | Disposition: A | Discharge status: Home / Self Care | Payer: Medicare PPO | Attending: Emergency Medicine | Admitting: Emergency Medicine

## 2022-03-15 ENCOUNTER — Other Ambulatory Visit: Payer: Self-pay

## 2022-03-15 ENCOUNTER — Emergency Department (HOSPITAL_BASED_OUTPATIENT_CLINIC_OR_DEPARTMENT_OTHER): Payer: Medicare PPO

## 2022-03-15 ENCOUNTER — Encounter (HOSPITAL_BASED_OUTPATIENT_CLINIC_OR_DEPARTMENT_OTHER): Payer: Self-pay | Admitting: Urology

## 2022-03-15 DIAGNOSIS — Z79899 Other long term (current) drug therapy: Secondary | ICD-10-CM | POA: Diagnosis not present

## 2022-03-15 DIAGNOSIS — Z7984 Long term (current) use of oral hypoglycemic drugs: Secondary | ICD-10-CM | POA: Insufficient documentation

## 2022-03-15 DIAGNOSIS — J929 Pleural plaque without asbestos: Secondary | ICD-10-CM | POA: Diagnosis not present

## 2022-03-15 DIAGNOSIS — I700 Atherosclerosis of aorta: Secondary | ICD-10-CM | POA: Diagnosis not present

## 2022-03-15 DIAGNOSIS — Z91040 Latex allergy status: Secondary | ICD-10-CM | POA: Insufficient documentation

## 2022-03-15 DIAGNOSIS — E119 Type 2 diabetes mellitus without complications: Secondary | ICD-10-CM | POA: Insufficient documentation

## 2022-03-15 DIAGNOSIS — R918 Other nonspecific abnormal finding of lung field: Secondary | ICD-10-CM | POA: Diagnosis not present

## 2022-03-15 DIAGNOSIS — R7989 Other specified abnormal findings of blood chemistry: Secondary | ICD-10-CM | POA: Diagnosis not present

## 2022-03-15 DIAGNOSIS — I10 Essential (primary) hypertension: Secondary | ICD-10-CM | POA: Diagnosis not present

## 2022-03-15 DIAGNOSIS — J189 Pneumonia, unspecified organism: Secondary | ICD-10-CM | POA: Diagnosis not present

## 2022-03-15 DIAGNOSIS — R0602 Shortness of breath: Secondary | ICD-10-CM | POA: Diagnosis not present

## 2022-03-15 LAB — COMPREHENSIVE METABOLIC PANEL
ALT: 22 U/L (ref 0–44)
AST: 19 U/L (ref 15–41)
Albumin: 3.6 g/dL (ref 3.5–5.0)
Alkaline Phosphatase: 58 U/L (ref 38–126)
Anion gap: 10 (ref 5–15)
BUN: 22 mg/dL (ref 8–23)
CO2: 23 mmol/L (ref 22–32)
Calcium: 8.4 mg/dL — ABNORMAL LOW (ref 8.9–10.3)
Chloride: 102 mmol/L (ref 98–111)
Creatinine, Ser: 1.14 mg/dL — ABNORMAL HIGH (ref 0.44–1.00)
GFR, Estimated: 50 mL/min — ABNORMAL LOW (ref >60)
Glucose, Bld: 143 mg/dL — ABNORMAL HIGH (ref 70–99)
Potassium: 3.4 mmol/L — ABNORMAL LOW (ref 3.5–5.1)
Sodium: 135 mmol/L (ref 135–145)
Total Bilirubin: 0.7 mg/dL (ref 0.3–1.2)
Total Protein: 7.4 g/dL (ref 6.5–8.1)

## 2022-03-15 LAB — CBC WITH DIFFERENTIAL/PLATELET
Abs Immature Granulocytes: 0.07 10*3/uL (ref 0.00–0.07)
Basophils Absolute: 0 10*3/uL (ref 0.0–0.1)
Basophils Relative: 0 %
Eosinophils Absolute: 0 10*3/uL (ref 0.0–0.5)
Eosinophils Relative: 0 %
HCT: 35.1 % — ABNORMAL LOW (ref 36.0–46.0)
Hemoglobin: 11.8 g/dL — ABNORMAL LOW (ref 12.0–15.0)
Immature Granulocytes: 1 %
Lymphocytes Relative: 25 %
Lymphs Abs: 1.7 10*3/uL (ref 0.7–4.0)
MCH: 29.3 pg (ref 26.0–34.0)
MCHC: 33.6 g/dL (ref 30.0–36.0)
MCV: 87.1 fL (ref 80.0–100.0)
Monocytes Absolute: 0.8 10*3/uL (ref 0.1–1.0)
Monocytes Relative: 11 %
Neutro Abs: 4.3 10*3/uL (ref 1.7–7.7)
Neutrophils Relative %: 63 %
Platelets: 305 10*3/uL (ref 150–400)
RBC: 4.03 MIL/uL (ref 3.87–5.11)
RDW: 13.3 % (ref 11.5–15.5)
WBC: 6.9 10*3/uL (ref 4.0–10.5)
nRBC: 0 % (ref 0.0–0.2)

## 2022-03-15 LAB — BRAIN NATRIURETIC PEPTIDE: B Natriuretic Peptide: 124.6 pg/mL — ABNORMAL HIGH (ref 0.0–100.0)

## 2022-03-15 LAB — TROPONIN I (HIGH SENSITIVITY)
Troponin I (High Sensitivity): 14 ng/L (ref <18)
Troponin I (High Sensitivity): 15 ng/L (ref <18)

## 2022-03-15 LAB — D-DIMER, QUANTITATIVE: D-Dimer, Quant: 3.08 ug/mL-FEU — ABNORMAL HIGH (ref 0.00–0.50)

## 2022-03-15 MED ORDER — IOHEXOL 350 MG/ML SOLN
60.0000 mL | Freq: Once | INTRAVENOUS | Status: AC | PRN
  Administered 2022-03-15: 60 mL via INTRAVENOUS

## 2022-03-15 MED ORDER — DOXYCYCLINE HYCLATE 100 MG PO CAPS: 100.0000 mg | ORAL_CAPSULE | Freq: Two times a day (BID) | ORAL | 0 refills | Status: DC

## 2022-03-15 MED ORDER — ACETAMINOPHEN 500 MG PO TABS
1000.0000 mg | ORAL_TABLET | Freq: Once | ORAL | Status: AC
  Administered 2022-03-15: 1000 mg via ORAL
  Filled 2022-03-15: qty 2

## 2022-03-15 MED ORDER — METHYLPREDNISOLONE SODIUM SUCC 125 MG IJ SOLR
125.0000 mg | Freq: Once | INTRAMUSCULAR | Status: AC
  Administered 2022-03-15: 125 mg via INTRAVENOUS
  Filled 2022-03-15: qty 2

## 2022-03-15 MED ORDER — IPRATROPIUM-ALBUTEROL 0.5-2.5 (3) MG/3ML IN SOLN
3.0000 mL | Freq: Once | RESPIRATORY_TRACT | Status: AC
  Administered 2022-03-15: 3 mL via RESPIRATORY_TRACT
  Filled 2022-03-15: qty 3

## 2022-03-15 MED ORDER — ALBUTEROL SULFATE HFA 108 (90 BASE) MCG/ACT IN AERS: 1.0000 | INHALATION_SPRAY | Freq: Four times a day (QID) | RESPIRATORY_TRACT | 0 refills | Status: DC | PRN

## 2022-03-15 NOTE — ED Triage Notes (Signed)
Pt tested positive for RSV, was admitted, discharged on Saturday Spo2 at home 87-88%, temp 100 SOB with exertion  Not using O2 at home

## 2022-03-15 NOTE — ED Provider Notes (Signed)
[facility] EMERGENCY DEPARTMENT Provider Note   CSN: [id number] Arrival date & time: 03/15/22  1332     History {Add pertinent medical, surgical, social history, OB history to HPI:1} Chief Complaint  Patient presents with   Shortness of Breath    Vanessa Frank is a 76 y.o. female.   Shortness of Breath      Home Medications Prior to Admission medications   Medication Sig Start Date End Date Taking? Authorizing Provider  atorvastatin (LIPITOR) 10 MG tablet Take 10 mg by mouth at bedtime.    [provider]  B Complex Vitamins (VITAMIN B COMPLEX) TABS Take 1 tablet by mouth 3 (three) times a week.    [provider]  carboxymethylcellulose (REFRESH PLUS) 0.5 % SOLN Place 1 drop into both eyes 3 (three) times daily as needed (dry eyes).    [provider]  ELDERBERRY PO Take 1 each by mouth every other day.    [provider]  leflunomide (ARAVA) 20 MG tablet Take 20 mg by mouth daily.    [provider]  lisinopril (PRINIVIL,ZESTRIL) 10 MG tablet Take 10 mg by mouth every morning.    [provider]  melatonin 5 MG TABS Take 5 mg by mouth at bedtime.    [provider]  metFORMIN (GLUCOPHAGE-XR) 500 MG 24 hr tablet Take 500 mg by mouth 2 (two) times daily with a meal.    [provider]  mirabegron ER (MYRBETRIQ) 25 MG TB24 tablet Take 1 tablet (25 mg total) by mouth daily. 02/23/22   Marguerita Beards, MD  Multiple Vitamin (MULTIVITAMIN) tablet Take 1 tablet by mouth 3 (three) times a week.    [provider]  oxyCODONE-acetaminophen (PERCOCET) 10-325 MG tablet Take 1-2 tablets by mouth daily as needed for pain. 06/15/17   [provider]  pioglitazone (ACTOS) 30 MG tablet TAKE 1 TABLET BY MOUTH EVERY MORNING Patient taking differently: Take 30 mg by mouth daily. 03/18/14   [name], [name], DO  sertraline (ZOLOFT) 100 MG tablet Take 100 mg by mouth every morning.    [provider]      Allergies    Adhesive [tape], Codeine, Latex, and Chocolate    Review of Systems   Review of Systems  Respiratory:  Positive for shortness of breath.     Physical Exam Updated Vital Signs BP (!) 143/78 (BP Location: Left Arm)   Pulse (!) 117   Temp 98.9 F (37.2 C)   Resp 20   Ht 5' (1.524 m)   Wt 82.3 kg   SpO2 93%   BMI 35.43 kg/m  Physical Exam  ED Results / Procedures / Treatments   Labs (all labs ordered are listed, but only abnormal results are displayed) Labs Reviewed  CBC WITH DIFFERENTIAL/PLATELET  COMPREHENSIVE METABOLIC PANEL  D-DIMER, QUANTITATIVE  TROPONIN I (HIGH SENSITIVITY)    EKG None  Radiology DG Chest 2 View  Result Date: 03/15/2022 CLINICAL DATA:  SOB, RSV last week EXAM: CHEST - 2 VIEW COMPARISON:  Radiograph 03/11/2022 FINDINGS: Unchanged cardiomediastinal silhouette. No focal airspace consolidation. There is no pleural effusion or evidence of pneumothorax. Bilateral shoulder degenerative changes. Thoracic spondylosis. Partially visualized lumbar spine fusion hardware. IMPRESSION: No evidence of acute cardiopulmonary disease. Electronically Signed   By: Caprice Renshaw M.D.   On: 03/15/2022 14:37    Procedures Procedures  {Document cardiac monitor, telemetry assessment procedure when appropriate:1}  Medications Ordered in ED Medications - No data to display  ED Course/ Medical Decision Making/ A&P                           Medical Decision Making Amount and/or Complexity of Data Reviewed Labs: ordered. Radiology: ordered.   ***  {Document critical care time when appropriate:1} {Document review of labs and clinical decision tools ie heart score, Chads2Vasc2 etc:1}  {Document your independent review of radiology images, and any outside records:1} {Document your discussion with family members, caretakers, and with consultants:1} {Document social determinants of health affecting pt's care:1} {Document your decision  making why or why not admission, treatments were needed:1} Final Clinical Impression(s) / ED Diagnoses Final diagnoses:  None    Rx / DC Orders ED Discharge Orders     None

## 2022-03-15 NOTE — Progress Notes (Signed)
Patient ambulated around the department while on pulse ox.  Initially while ambulating patient's SPO2 was 95%.  Upon returning to the room the patient's had increased to 97% and HR was 120.  Patient is smiling and says she feels better and did not feel short of breath.

## 2022-03-15 NOTE — ED Notes (Signed)
Pt has gone over for CT scan.

## 2022-03-15 NOTE — ED Notes (Signed)
ED Provider/PA at bedside.

## 2022-03-15 NOTE — Discharge Instructions (Addendum)
You were seen in the emergency room today for evaluation of your cough and shortness of breath.  This is likely an exasperation of your RSV/atypical pneumonia.  Because of this, we will send you home on an albuterol inhaler to use to help with your cough.  I am also sending you home with some doxycycline which is an antibiotic for you to take as prescribed.  Please follow-up with your primary care doctor within the next week for reevaluation.  If you have any concerns, new or worsening symptoms, please return to the nearest emergency department for evaluation.  Contact a health care provider if: You have a fever. You have trouble sleeping because you cannot control your cough with cough medicine. Get help right away if: Your shortness of breath becomes worse. Your chest pain increases. Your sickness becomes worse, especially if you are an older adult or have a weak immune system. You cough up blood. These symptoms may be an emergency. Get help right away. Call 911. Do not wait to see if the symptoms will go away. Do not drive yourself to the hospital.

## 2022-03-15 NOTE — ED Notes (Signed)
LS clear in upper lobes, fine crackles noted in bilateral lung bases, no wheezing noted and NAD at this time

## 2022-03-25 DIAGNOSIS — H902 Conductive hearing loss, unspecified: Secondary | ICD-10-CM | POA: Diagnosis not present

## 2022-03-25 DIAGNOSIS — H6690 Otitis media, unspecified, unspecified ear: Secondary | ICD-10-CM | POA: Diagnosis not present

## 2022-03-25 DIAGNOSIS — J210 Acute bronchiolitis due to respiratory syncytial virus: Secondary | ICD-10-CM | POA: Diagnosis not present

## 2022-03-25 DIAGNOSIS — J189 Pneumonia, unspecified organism: Secondary | ICD-10-CM | POA: Diagnosis not present

## 2022-03-30 ENCOUNTER — Ambulatory Visit: Payer: Medicare PPO | Admitting: Obstetrics and Gynecology

## 2022-04-28 DIAGNOSIS — R200 Anesthesia of skin: Secondary | ICD-10-CM | POA: Diagnosis not present

## 2022-05-06 DIAGNOSIS — E1122 Type 2 diabetes mellitus with diabetic chronic kidney disease: Secondary | ICD-10-CM | POA: Diagnosis not present

## 2022-05-06 DIAGNOSIS — N289 Disorder of kidney and ureter, unspecified: Secondary | ICD-10-CM | POA: Diagnosis not present

## 2022-05-06 DIAGNOSIS — Z8619 Personal history of other infectious and parasitic diseases: Secondary | ICD-10-CM | POA: Diagnosis not present

## 2022-05-06 DIAGNOSIS — I10 Essential (primary) hypertension: Secondary | ICD-10-CM | POA: Diagnosis not present

## 2022-05-06 DIAGNOSIS — J309 Allergic rhinitis, unspecified: Secondary | ICD-10-CM | POA: Diagnosis not present

## 2022-05-06 DIAGNOSIS — N1832 Chronic kidney disease, stage 3b: Secondary | ICD-10-CM | POA: Diagnosis not present

## 2022-05-06 DIAGNOSIS — F330 Major depressive disorder, recurrent, mild: Secondary | ICD-10-CM | POA: Diagnosis not present

## 2022-05-13 ENCOUNTER — Ambulatory Visit
Admission: EM | Admit: 2022-05-13 | Discharge: 2022-05-13 | Disposition: A | Discharge status: Home / Self Care | Payer: Medicare PPO | Attending: Urgent Care | Admitting: Urgent Care

## 2022-05-13 ENCOUNTER — Ambulatory Visit (INDEPENDENT_AMBULATORY_CARE_PROVIDER_SITE_OTHER): Payer: Medicare PPO

## 2022-05-13 DIAGNOSIS — R059 Cough, unspecified: Secondary | ICD-10-CM

## 2022-05-13 DIAGNOSIS — R0602 Shortness of breath: Secondary | ICD-10-CM | POA: Diagnosis not present

## 2022-05-13 DIAGNOSIS — U071 COVID-19: Secondary | ICD-10-CM | POA: Diagnosis not present

## 2022-05-13 DIAGNOSIS — R062 Wheezing: Secondary | ICD-10-CM

## 2022-05-13 DIAGNOSIS — R0989 Other specified symptoms and signs involving the circulatory and respiratory systems: Secondary | ICD-10-CM

## 2022-05-13 MED ORDER — PREDNISONE 10 MG PO TABS: 30.0000 mg | ORAL_TABLET | Freq: Every day | ORAL | 0 refills | Status: AC

## 2022-05-13 MED ORDER — PROMETHAZINE-DM 6.25-15 MG/5ML PO SYRP: 2.5000 mL | ORAL_SOLUTION | Freq: Three times a day (TID) | ORAL | 0 refills | Status: AC | PRN

## 2022-05-13 NOTE — Discharge Instructions (Addendum)
Finish your Paxlovid, start the albuterol inhaler tonight. Use the cough medications as needed. Start prednisone tomorrow.    For diabetes or elevated blood sugar, please make sure you are limiting and avoiding starchy, carbohydrate foods like pasta, breads, sweet breads, pastry, rice, potatoes, desserts. These foods can elevate your blood sugar. Also, limit and avoid drinks that contain a lot of sugar such as sodas, sweet teas, fruit juices.  Drinking plain water will be much more helpful, try 64 ounces of water daily.  It is okay to flavor your water naturally by cutting cucumber, lemon, mint or lime, placing it in a picture with water and drinking it over a period of 24-48 hours as long as it remains refrigerated.  For elevated blood pressure, make sure you are monitoring salt in your diet.  Do not eat restaurant foods and limit processed foods at home. I highly recommend you prepare and cook your own foods at home.  Processed foods include things like frozen meals, pre-seasoned meats and dinners, deli meats, canned foods as these foods contain a high amount of sodium/salt.  Make sure you are paying attention to sodium labels on foods you buy at the grocery store. Buy your spices separately such as garlic powder, onion powder, cumin, cayenne, parsley flakes so that you can avoid seasonings that contain salt. However, salt-free seasonings are available and can be used, an example is Mrs. Dash and includes a lot of different mixtures that do not contain salt.  Lastly, when cooking using oils that are healthier for you is important. This includes olive oil, avocado oil, canola oil. We have discussed a lot of foods to avoid but below is a list of foods that can be very healthy to use in your diet whether it is for diabetes, cholesterol, high blood pressure, or in general healthy eating.  Salads - kale, spinach, cabbage, spring mix, arugula Fruits - avocadoes, berries (blueberries, raspberries,  blackberries), apples, oranges, pomegranate, grapefruit, kiwi Vegetables - asparagus, cauliflower, broccoli, green beans, brussel sprouts, bell peppers, beets; stay away from or limit starchy vegetables like potatoes, carrots, peas Other general foods - kidney beans, egg whites, almonds, walnuts, sunflower seeds, pumpkin seeds, fat free yogurt, almond milk, flax seeds, quinoa, oats  Meat - It is better to eat lean meats and limit your red meat including pork to once a week.  Wild caught fish, chicken breast are good options as they tend to be leaner sources of good protein. Still be mindful of the sodium labels for the meats you buy.  DO NOT EAT ANY FOODS ON THIS LIST THAT YOU ARE ALLERGIC TO. For more specific needs, I highly recommend consulting a dietician or nutritionist but this can definitely be a good starting point.

## 2022-05-13 NOTE — ED Triage Notes (Signed)
Pt states she tested +covid -taking paxlovid-c/o cough, SHOB-DOE noted-slow gait with own cane

## 2022-05-13 NOTE — ED Provider Notes (Signed)
[facility]  Note:  This document was prepared using Conservation officer, historic buildings and may include unintentional dictation errors.  MRN: [id number] DOB: 21-Apr-1945  Subjective:   Vanessa Frank is a 77 y.o. female presenting for recheck on persistent coughing, malaise, fatigue, shortness of breath. Tested positive for COVID 19 and has one more day of the medication. Was treated for atypical pneumonia at the end of November as seen through a chest CT scan.  She also had RSV.  No history of congestive heart failure, asthma, respiratory disorders.  However, she does have a history of shortness of breath.  She is also experiencing bilateral ear fullness and decreased hearing.  No current facility-administered medications for this encounter.  Current Outpatient Medications:    albuterol (VENTOLIN HFA) 108 (90 Base) MCG/ACT inhaler, Inhale 1-2 puffs into the lungs every 6 (six) hours as needed for wheezing or shortness of breath., Disp: 1 each, Rfl: 0   atorvastatin (LIPITOR) 10 MG tablet, Take 10 mg by mouth at bedtime., Disp: , Rfl:    B Complex Vitamins (VITAMIN B COMPLEX) TABS, Take 1 tablet by mouth 3 (three) times a week., Disp: , Rfl:    carboxymethylcellulose (REFRESH PLUS) 0.5 % SOLN, Place 1 drop into both eyes 3 (three) times daily as needed (dry eyes)., Disp: , Rfl:    doxycycline (VIBRAMYCIN) 100 MG capsule, Take 1 capsule (100 mg total) by mouth 2 (two) times daily., Disp: 14 capsule, Rfl: 0   ELDERBERRY PO, Take 1 each by mouth every other day., Disp: , Rfl:    leflunomide (ARAVA) 20 MG tablet, Take 20 mg by mouth daily., Disp: , Rfl:    lisinopril (PRINIVIL,ZESTRIL) 10 MG tablet, Take 10 mg by mouth every morning., Disp: , Rfl:    melatonin 5 MG TABS, Take 5 mg by mouth at bedtime., Disp: , Rfl:    metFORMIN (GLUCOPHAGE-XR) 500 MG 24 hr tablet, Take 500 mg by mouth 2 (two) times daily with a meal., Disp: , Rfl:    mirabegron ER (MYRBETRIQ) 25 MG TB24  tablet, Take 1 tablet (25 mg total) by mouth daily., Disp: 30 tablet, Rfl: 5   Multiple Vitamin (MULTIVITAMIN) tablet, Take 1 tablet by mouth 3 (three) times a week., Disp: , Rfl:    oxyCODONE-acetaminophen (PERCOCET) 10-325 MG tablet, Take 1-2 tablets by mouth daily as needed for pain., Disp: , Rfl: 0   pioglitazone (ACTOS) 30 MG tablet, TAKE 1 TABLET BY MOUTH EVERY MORNING (Patient taking differently: Take 30 mg by mouth daily.), Disp: 90 tablet, Rfl: 0   sertraline (ZOLOFT) 100 MG tablet, Take 100 mg by mouth every morning., Disp: , Rfl:    Allergies  Allergen Reactions   Adhesive [Tape] Itching   Codeine Nausea Only    Can tolerate with antinausea meds   Latex Itching   Chocolate Rash    Per allergy Test  - Pt has not experienced any symptoms     Past Medical History:  Diagnosis Date   Anxiety    Arthritis    Diabetes mellitus ORAL MEDS ONLY   Dysrhythmia IRREGULAR  RATE IN HER 20'S   Fibromyalgia    GERD (gastroesophageal reflux disease)    Headache(784.0)    Hypertension    Neuropathy    TOES   Palpitations    OCCASIONAL   Shortness of breath WITH EXERTION   Stress incontinence      Past Surgical History:  Procedure Laterality Date   ABDOMINAL HYSTERECTOMY  80'S   Partial    ANTERIOR LAT LUMBAR FUSION  03/26/2011   Procedure: ANTERIOR LATERAL LUMBAR FUSION 1 LEVEL;  Surgeon: Eustace Moore;  Location: [facility];  Service: Neurosurgery;  Laterality: Left;  Left Lumbar three-four anterolateral retroperitoneal interbody fusion    ARTHROSCOPY KNEE W/ DRILLING  09 LEFT   2012 RIGHT   BARTHOLIN GLAND CYST EXCISION  1983   CHOLECYSTECTOMY  1989   CYSTOSCOPY  80'S   FOOT SURGERY Right    SPINAL FUSION  2012   Lower Back   TOTAL KNEE ARTHROPLASTY Left 05/28/2013   Procedure: LEFT TOTAL KNEE ARTHROPLASTY;  Surgeon: Gearlean Alf, MD;  Location: [facility];  Service: Orthopedics;  Laterality: Left;   TOTAL KNEE ARTHROPLASTY Right 10/29/2013   Procedure: RIGHT TOTAL KNEE  ARTHROPLASTY;  Surgeon: Gearlean Alf, MD;  Location: [facility];  Service: Orthopedics;  Laterality: Right;    Family History  Problem Relation Age of Onset   Depression Mother    Congestive Heart Failure Mother    Allergy (severe) Mother    Heart disease Mother    Alcoholism Mother    Hypertension Mother    Osteoarthritis Mother    Congestive Heart Failure Father    Cancer Father    Depression Maternal Grandmother    Osteoarthritis Maternal Grandmother    Depression Sister    Rheum arthritis Paternal Grandmother    Osteoarthritis Paternal Grandmother    Osteoarthritis Sister    Clotting disorder Sister     Social History   Tobacco Use   Smoking status: Never   Smokeless tobacco: Never  Vaping Use   Vaping Use: Never used  Substance Use Topics   Alcohol use: No   Drug use: No    ROS   Objective:   Vitals: BP (!) 142/93 (BP Location: Left Arm)   Pulse 97   Temp 98.5 F (36.9 C) (Oral)   Resp 18   SpO2 95%   Physical Exam Constitutional:      General: She is not in acute distress.    Appearance: Normal appearance. She is well-developed and normal weight. She is not ill-appearing, toxic-appearing or diaphoretic.  HENT:     Head: Normocephalic and atraumatic.     Right Ear: Tympanic membrane, ear canal and external ear normal. No drainage or tenderness. No middle ear effusion. There is no impacted cerumen. Tympanic membrane is not erythematous or bulging.     Left Ear: Tympanic membrane, ear canal and external ear normal. No drainage or tenderness.  No middle ear effusion. There is no impacted cerumen. Tympanic membrane is not erythematous or bulging.     Nose: Nose normal. No congestion or rhinorrhea.     Mouth/Throat:     Mouth: Mucous membranes are moist. No oral lesions.     Pharynx: No pharyngeal swelling, oropharyngeal exudate, posterior oropharyngeal erythema or uvula swelling.     Tonsils: No tonsillar exudate or tonsillar abscesses.  Eyes:      General: No scleral icterus.       Right eye: No discharge.        Left eye: No discharge.     Extraocular Movements: Extraocular movements intact.     Right eye: Normal extraocular motion.     Left eye: Normal extraocular motion.     Conjunctiva/sclera: Conjunctivae normal.  Cardiovascular:     Rate and Rhythm: Normal rate and regular rhythm.     Heart sounds: Normal heart sounds. No murmur heard.  No friction rub. No gallop.  Pulmonary:     Effort: Pulmonary effort is normal. No respiratory distress.     Breath sounds: No stridor. No wheezing, rhonchi or rales.  Chest:     Chest wall: No tenderness.  Musculoskeletal:     Cervical back: Normal range of motion and neck supple.  Lymphadenopathy:     Cervical: No cervical adenopathy.  Skin:    General: Skin is warm and dry.  Neurological:     General: No focal deficit present.     Mental Status: She is alert and oriented to person, place, and time.  Psychiatric:        Mood and Affect: Mood normal.        Behavior: Behavior normal.    DG Chest 2 View  Result Date: 05/13/2022 CLINICAL DATA:  Cough and wheezing EXAM: CHEST - 2 VIEW COMPARISON:  03/15/2022 FINDINGS: Mild bronchitic changes. No acute airspace disease or effusion. Normal cardiac size. Aortic atherosclerosis. Scoliosis of the spine. No pneumothorax. Hardware in the lumbar spine. IMPRESSION: No active cardiopulmonary disease. Electronically Signed   By: Donavan Foil M.D.   On: 05/13/2022 18:25     CT Angio Chest PE W and/or Wo Contrast  Result Date: 03/15/2022 CLINICAL DATA:  Pulmonary embolism (PE) suspected, low to intermediate prob, positive D-dimer EXAM: CT ANGIOGRAPHY CHEST WITH CONTRAST TECHNIQUE: Multidetector CT imaging of the chest was performed using the standard protocol during bolus administration of intravenous contrast. Multiplanar CT image reconstructions and MIPs were obtained to evaluate the vascular anatomy. RADIATION DOSE REDUCTION: This exam was  performed according to the departmental dose-optimization program which includes automated exposure control, adjustment of the mA and/or kV according to patient size and/or use of iterative reconstruction technique. CONTRAST:  50mL OMNIPAQUE IOHEXOL 350 MG/ML SOLN COMPARISON:  CT heart 08/11/2020, chest x-ray 03/15/2022 FINDINGS: Cardiovascular: Satisfactory opacification of the pulmonary arteries to the segmental level. No evidence of central or proximal segmental pulmonary embolism. Limited evaluation more distally due to artifact. The main pulmonary artery is normal in caliber. Normal heart size. No significant pericardial effusion. The thoracic aorta is normal in caliber. At least mild atherosclerotic plaque of the thoracic aorta. No coronary artery calcifications. Mediastinum/Nodes: No enlarged mediastinal, hilar, or axillary lymph nodes. Thyroid gland, trachea, and esophagus demonstrate no significant findings. Nonspecific patulous distal esophagus. Lungs/Pleura: Expiratory phase of respiration. Diffuse bronchial wall thickening. No focal consolidation. No pulmonary nodule. Bilateral upper lobe and lower lobe vague peribronchovascular tree-in-bud nodularity and ground-glass airspace opacities. No pleural effusion. No pneumothorax. Upper Abdomen: No acute abnormality. Musculoskeletal: No abdominal wall hernia or abnormality. No suspicious lytic or blastic osseous lesions. No acute displaced fracture. Multilevel degenerative changes of the spine. Review of the MIP images confirms the above findings. IMPRESSION: 1. Pulmonary findings suggestive of atypical pneumonia/bronchiolitis. 2. No evidence of central or proximal segmental pulmonary embolism. Limited evaluation more distally due to artifact. 3.  Aortic Atherosclerosis (ICD10-I70.0). Electronically Signed   By: Iven Finn M.D.   On: 03/15/2022 20:18   DG Chest 2 View  Result Date: 03/15/2022 CLINICAL DATA:  SOB, RSV last week EXAM: CHEST - 2 VIEW  COMPARISON:  Radiograph 03/11/2022 FINDINGS: Unchanged cardiomediastinal silhouette. No focal airspace consolidation. There is no pleural effusion or evidence of pneumothorax. Bilateral shoulder degenerative changes. Thoracic spondylosis. Partially visualized lumbar spine fusion hardware. IMPRESSION: No evidence of acute cardiopulmonary disease. Electronically Signed   By: Maurine Simmering M.D.   On: 03/15/2022 14:37   DG Chest [facility]  1 View  Result Date: 03/11/2022 CLINICAL DATA:  Shortness of breath EXAM: PORTABLE CHEST 1 VIEW COMPARISON:  07/04/2020 FINDINGS: The heart size and mediastinal contours are within normal limits. Both lungs are clear. The visualized skeletal structures are unremarkable. IMPRESSION: No acute abnormality of the lungs in AP portable projection. Electronically Signed   By: Jearld Lesch M.D.   On: 03/11/2022 18:51    Assessment and Plan :   PDMP not reviewed this encounter.  1. COVID-19 virus infection   2. Shortness of breath   3. Chest congestion     Patient is about to finish Paxlovid and given her persistent symptoms, negative chest x-ray offered an oral prednisone course at 30 mg for 5 days.  She has an albuterol inhaler which I recommended she restart.  Unremarkable ENT exam.  Will use conservative management for what I suspect is eustachian tube dysfunction.  Recommended starting Flonase, Zyrtec long-term for this.  Use supportive care otherwise.  Counseled patient on potential for adverse effects with medications prescribed/recommended today, ER and return-to-clinic precautions discussed, patient verbalized understanding.    Wallis Bamberg, [place] 05/13/22 1944

## 2022-06-21 DIAGNOSIS — M4690 Unspecified inflammatory spondylopathy, site unspecified: Secondary | ICD-10-CM | POA: Diagnosis not present

## 2022-06-21 DIAGNOSIS — M549 Dorsalgia, unspecified: Secondary | ICD-10-CM | POA: Diagnosis not present

## 2022-06-21 DIAGNOSIS — M797 Fibromyalgia: Secondary | ICD-10-CM | POA: Diagnosis not present

## 2022-06-21 DIAGNOSIS — M2550 Pain in unspecified joint: Secondary | ICD-10-CM | POA: Diagnosis not present

## 2022-06-21 DIAGNOSIS — N1831 Chronic kidney disease, stage 3a: Secondary | ICD-10-CM | POA: Diagnosis not present

## 2022-06-21 DIAGNOSIS — M79643 Pain in unspecified hand: Secondary | ICD-10-CM | POA: Diagnosis not present

## 2022-06-21 DIAGNOSIS — Z79899 Other long term (current) drug therapy: Secondary | ICD-10-CM | POA: Diagnosis not present

## 2022-06-21 DIAGNOSIS — M1990 Unspecified osteoarthritis, unspecified site: Secondary | ICD-10-CM | POA: Diagnosis not present

## 2022-06-23 DIAGNOSIS — J45909 Unspecified asthma, uncomplicated: Secondary | ICD-10-CM | POA: Diagnosis not present

## 2022-06-23 DIAGNOSIS — E1122 Type 2 diabetes mellitus with diabetic chronic kidney disease: Secondary | ICD-10-CM | POA: Diagnosis not present

## 2022-06-23 DIAGNOSIS — Z8619 Personal history of other infectious and parasitic diseases: Secondary | ICD-10-CM | POA: Diagnosis not present

## 2022-06-23 DIAGNOSIS — Z8616 Personal history of COVID-19: Secondary | ICD-10-CM | POA: Diagnosis not present

## 2022-06-28 DIAGNOSIS — H938X3 Other specified disorders of ear, bilateral: Secondary | ICD-10-CM | POA: Diagnosis not present

## 2022-06-28 DIAGNOSIS — H903 Sensorineural hearing loss, bilateral: Secondary | ICD-10-CM | POA: Diagnosis not present

## 2022-07-21 DIAGNOSIS — G894 Chronic pain syndrome: Secondary | ICD-10-CM | POA: Diagnosis not present

## 2022-07-21 DIAGNOSIS — M961 Postlaminectomy syndrome, not elsewhere classified: Secondary | ICD-10-CM | POA: Diagnosis not present

## 2022-07-21 DIAGNOSIS — M48061 Spinal stenosis, lumbar region without neurogenic claudication: Secondary | ICD-10-CM | POA: Diagnosis not present

## 2022-07-21 DIAGNOSIS — M461 Sacroiliitis, not elsewhere classified: Secondary | ICD-10-CM | POA: Diagnosis not present

## 2022-08-19 DIAGNOSIS — L820 Inflamed seborrheic keratosis: Secondary | ICD-10-CM | POA: Diagnosis not present

## 2022-08-19 DIAGNOSIS — L03019 Cellulitis of unspecified finger: Secondary | ICD-10-CM | POA: Diagnosis not present

## 2022-08-19 DIAGNOSIS — L304 Erythema intertrigo: Secondary | ICD-10-CM | POA: Diagnosis not present

## 2022-08-24 DIAGNOSIS — F3341 Major depressive disorder, recurrent, in partial remission: Secondary | ICD-10-CM | POA: Diagnosis not present

## 2022-08-24 DIAGNOSIS — M47817 Spondylosis without myelopathy or radiculopathy, lumbosacral region: Secondary | ICD-10-CM | POA: Diagnosis not present

## 2022-08-24 DIAGNOSIS — I10 Essential (primary) hypertension: Secondary | ICD-10-CM | POA: Diagnosis not present

## 2022-08-24 DIAGNOSIS — G894 Chronic pain syndrome: Secondary | ICD-10-CM | POA: Diagnosis not present

## 2022-08-24 DIAGNOSIS — E119 Type 2 diabetes mellitus without complications: Secondary | ICD-10-CM | POA: Diagnosis not present

## 2022-08-24 DIAGNOSIS — E1169 Type 2 diabetes mellitus with other specified complication: Secondary | ICD-10-CM | POA: Diagnosis not present

## 2022-08-24 DIAGNOSIS — K219 Gastro-esophageal reflux disease without esophagitis: Secondary | ICD-10-CM | POA: Diagnosis not present

## 2022-08-24 DIAGNOSIS — Z0000 Encounter for general adult medical examination without abnormal findings: Secondary | ICD-10-CM | POA: Diagnosis not present

## 2022-08-24 DIAGNOSIS — N1832 Chronic kidney disease, stage 3b: Secondary | ICD-10-CM | POA: Diagnosis not present

## 2022-09-21 DIAGNOSIS — M1990 Unspecified osteoarthritis, unspecified site: Secondary | ICD-10-CM | POA: Diagnosis not present

## 2022-09-21 DIAGNOSIS — N1831 Chronic kidney disease, stage 3a: Secondary | ICD-10-CM | POA: Diagnosis not present

## 2022-09-21 DIAGNOSIS — M4690 Unspecified inflammatory spondylopathy, site unspecified: Secondary | ICD-10-CM | POA: Diagnosis not present

## 2022-09-21 DIAGNOSIS — Z79899 Other long term (current) drug therapy: Secondary | ICD-10-CM | POA: Diagnosis not present

## 2022-09-21 DIAGNOSIS — M2550 Pain in unspecified joint: Secondary | ICD-10-CM | POA: Diagnosis not present

## 2022-09-21 DIAGNOSIS — M79644 Pain in right finger(s): Secondary | ICD-10-CM | POA: Diagnosis not present

## 2022-09-21 DIAGNOSIS — M6530 Trigger finger, unspecified finger: Secondary | ICD-10-CM | POA: Diagnosis not present

## 2022-09-21 DIAGNOSIS — M797 Fibromyalgia: Secondary | ICD-10-CM | POA: Diagnosis not present

## 2022-09-21 DIAGNOSIS — M549 Dorsalgia, unspecified: Secondary | ICD-10-CM | POA: Diagnosis not present

## 2022-09-29 DIAGNOSIS — H04123 Dry eye syndrome of bilateral lacrimal glands: Secondary | ICD-10-CM | POA: Diagnosis not present

## 2022-09-29 DIAGNOSIS — H524 Presbyopia: Secondary | ICD-10-CM | POA: Diagnosis not present

## 2022-09-29 DIAGNOSIS — Z7984 Long term (current) use of oral hypoglycemic drugs: Secondary | ICD-10-CM | POA: Diagnosis not present

## 2022-09-29 DIAGNOSIS — E119 Type 2 diabetes mellitus without complications: Secondary | ICD-10-CM | POA: Diagnosis not present

## 2022-09-29 DIAGNOSIS — H18413 Arcus senilis, bilateral: Secondary | ICD-10-CM | POA: Diagnosis not present

## 2022-09-29 DIAGNOSIS — H5203 Hypermetropia, bilateral: Secondary | ICD-10-CM | POA: Diagnosis not present

## 2022-09-29 DIAGNOSIS — H52203 Unspecified astigmatism, bilateral: Secondary | ICD-10-CM | POA: Diagnosis not present

## 2022-09-29 DIAGNOSIS — H35372 Puckering of macula, left eye: Secondary | ICD-10-CM | POA: Diagnosis not present

## 2022-09-29 DIAGNOSIS — H26492 Other secondary cataract, left eye: Secondary | ICD-10-CM | POA: Diagnosis not present

## 2022-10-04 DIAGNOSIS — L820 Inflamed seborrheic keratosis: Secondary | ICD-10-CM | POA: Diagnosis not present

## 2022-10-06 DIAGNOSIS — M5416 Radiculopathy, lumbar region: Secondary | ICD-10-CM | POA: Diagnosis not present

## 2022-10-06 DIAGNOSIS — M47816 Spondylosis without myelopathy or radiculopathy, lumbar region: Secondary | ICD-10-CM | POA: Diagnosis not present

## 2022-10-06 DIAGNOSIS — G894 Chronic pain syndrome: Secondary | ICD-10-CM | POA: Diagnosis not present

## 2022-10-08 ENCOUNTER — Other Ambulatory Visit: Payer: Self-pay | Admitting: Obstetrics and Gynecology

## 2022-10-08 DIAGNOSIS — N3281 Overactive bladder: Secondary | ICD-10-CM

## 2022-10-18 ENCOUNTER — Other Ambulatory Visit: Payer: Self-pay

## 2022-10-18 DIAGNOSIS — N3281 Overactive bladder: Secondary | ICD-10-CM

## 2022-10-18 MED ORDER — MIRABEGRON ER 25 MG PO TB24: 25.0000 mg | ORAL_TABLET | Freq: Every day | ORAL | 0 refills | Status: DC

## 2022-10-18 NOTE — Progress Notes (Signed)
Vanessa Frank is a 77 y.o. female called in for a refill on her Myrbetriq 25mg .  Pt was upset her prescription was not refilled by the pharmacy. I explained to the patient that she cancelled her 6 week medication f/u appt so her medication was not refilled. Pt did schedule a f/u appt with Waldo Laine, NP for 10/27/22 at 1pm.  A 30day supply of myrbetriq 25 mg has been sent to the pharmacy. Pt was notified if she does not keep her appointment her medication will not be refilled. Pt verbalized understanding.   NP Waldo Laine was notified

## 2022-10-27 ENCOUNTER — Ambulatory Visit: Payer: Medicare PPO | Admitting: Obstetrics and Gynecology

## 2022-10-27 ENCOUNTER — Encounter: Payer: Self-pay | Admitting: Obstetrics and Gynecology

## 2022-10-27 DIAGNOSIS — N3281 Overactive bladder: Secondary | ICD-10-CM

## 2022-10-27 MED ORDER — MIRABEGRON ER 25 MG PO TB24: 25.0000 mg | ORAL_TABLET | Freq: Every day | ORAL | 11 refills | Status: DC

## 2022-10-27 NOTE — Progress Notes (Signed)
Vanessa Frank Return Visit  SUBJECTIVE  History of Present Illness: Vanessa Frank is a 77 y.o. female seen in follow-up for OAB. Plan at last visit was start Myrbetriq 25mg  daily.  She reports the medication has been helpful of her symptoms. Endorses a 7/10 improvement score.      Past Medical History: Patient  has a past medical history of Anxiety, Arthritis, Diabetes mellitus (ORAL MEDS ONLY), Dysrhythmia (IRREGULAR  RATE IN HER 20'S), Fibromyalgia, GERD (gastroesophageal reflux disease), Headache(784.0), Hypertension, Neuropathy, Palpitations, Shortness of breath (WITH EXERTION), and Stress incontinence.   Past Surgical History: She  has a past surgical history that includes Abdominal hysterectomy (80'S); Cholecystectomy (1989); Bartholin gland cyst excision (1983); Arthroscopy knee w/ drilling (09 LEFT   2012 RIGHT); Cystoscopy (80'S); Anterior lat lumbar fusion (03/26/2011); Total knee arthroplasty (Left, 05/28/2013); Spinal fusion (2012); Foot surgery (Right); and Total knee arthroplasty (Right, 10/29/2013).   Medications: She has a current medication list which includes the following prescription(s): albuterol, atorvastatin, vitamin b complex, carboxymethylcellulose, doxycycline, elderberry, leflunomide, lisinopril, melatonin, metformin, multivitamin, oxycodone-acetaminophen, pioglitazone, prednisone, promethazine-dextromethorphan, sertraline, and mirabegron er.   Allergies: Patient is allergic to adhesive [tape], codeine, latex, and chocolate.   Social History: Patient  reports that she has never smoked. She has never used smokeless tobacco. She reports that she does not drink alcohol and does not use drugs.      OBJECTIVE     Physical Exam: Vitals:   10/27/22 1521  BP: 110/72  Pulse: 90   Gen: No apparent distress, A&O x 3.  Detailed Urogynecologic Evaluation:  Deferred.     ASSESSMENT AND PLAN    [name] is a 77 y.o. with:  1. Overactive bladder      Patient reports she is happy with the improvement for 25mg  Myrbetriq, we discussed going up to 50mg  and she reports she would prefer to keep it at 25mg  daily. She would like to remain on this at this time. She is not able to do PT at this time due to travel difficulties.   Recall for 1 year placed for patient.

## 2022-10-27 NOTE — Patient Instructions (Addendum)
Consider using Capsaicin patches for your back. They can be placed on the back at night and be helpful with back pain.   Continue on Myrbetriq 25mg  daily, prescription sent to pharmacy  Plan to follow up in 1 year

## 2022-11-04 DIAGNOSIS — M47816 Spondylosis without myelopathy or radiculopathy, lumbar region: Secondary | ICD-10-CM | POA: Diagnosis not present

## 2022-12-02 DIAGNOSIS — M47816 Spondylosis without myelopathy or radiculopathy, lumbar region: Secondary | ICD-10-CM | POA: Diagnosis not present

## 2022-12-28 DIAGNOSIS — S63601A Unspecified sprain of right thumb, initial encounter: Secondary | ICD-10-CM | POA: Diagnosis not present

## 2022-12-28 DIAGNOSIS — M79641 Pain in right hand: Secondary | ICD-10-CM | POA: Diagnosis not present

## 2022-12-29 DIAGNOSIS — M48061 Spinal stenosis, lumbar region without neurogenic claudication: Secondary | ICD-10-CM | POA: Diagnosis not present

## 2022-12-29 DIAGNOSIS — G894 Chronic pain syndrome: Secondary | ICD-10-CM | POA: Diagnosis not present

## 2022-12-29 DIAGNOSIS — M47816 Spondylosis without myelopathy or radiculopathy, lumbar region: Secondary | ICD-10-CM | POA: Diagnosis not present

## 2022-12-29 DIAGNOSIS — Z6836 Body mass index (BMI) 36.0-36.9, adult: Secondary | ICD-10-CM | POA: Diagnosis not present

## 2023-02-26 ENCOUNTER — Encounter (HOSPITAL_COMMUNITY): Payer: Self-pay

## 2023-02-26 ENCOUNTER — Ambulatory Visit (HOSPITAL_COMMUNITY)
Admission: EM | Admit: 2023-02-26 | Discharge: 2023-02-26 | Disposition: A | Discharge status: Home / Self Care | Payer: Medicare PPO | Attending: Internal Medicine | Admitting: Internal Medicine

## 2023-02-26 ENCOUNTER — Ambulatory Visit (INDEPENDENT_AMBULATORY_CARE_PROVIDER_SITE_OTHER): Payer: Medicare PPO

## 2023-02-26 DIAGNOSIS — R062 Wheezing: Secondary | ICD-10-CM

## 2023-02-26 DIAGNOSIS — J189 Pneumonia, unspecified organism: Secondary | ICD-10-CM | POA: Diagnosis not present

## 2023-02-26 DIAGNOSIS — R918 Other nonspecific abnormal finding of lung field: Secondary | ICD-10-CM | POA: Diagnosis not present

## 2023-02-26 DIAGNOSIS — I700 Atherosclerosis of aorta: Secondary | ICD-10-CM | POA: Diagnosis not present

## 2023-02-26 DIAGNOSIS — R0989 Other specified symptoms and signs involving the circulatory and respiratory systems: Secondary | ICD-10-CM | POA: Diagnosis not present

## 2023-02-26 MED ORDER — DOXYCYCLINE HYCLATE 100 MG PO CAPS: 100.0000 mg | ORAL_CAPSULE | Freq: Two times a day (BID) | ORAL | 0 refills | Status: AC

## 2023-02-26 MED ORDER — ALBUTEROL SULFATE HFA 108 (90 BASE) MCG/ACT IN AERS: 1.0000 | INHALATION_SPRAY | Freq: Four times a day (QID) | RESPIRATORY_TRACT | 0 refills | Status: DC | PRN

## 2023-02-26 NOTE — ED Triage Notes (Addendum)
Pt presents with reported loss of voice, nasal congestion, wheezing, cough, low-grade fever last night (101 F), and coughing up green mucus x 1 week. Pt reports taking Tylenol and using albuterol inhaler for symptoms at home with little improvement, "still lots of congestion in my chest."

## 2023-02-26 NOTE — ED Provider Notes (Signed)
[facility]    CSN: [id number] Arrival date & time: 02/26/23  1725      History   Chief Complaint No chief complaint on file.   HPI Vanessa Frank is a 77 y.o. female.   77 year old female presents to urgent care with complaints of cough, chest congestion, sore throat and loss of voice.  She reports that this started last week with relatively mild symptoms.  She reports on Thursday that her symptoms got much worse and she began running a low-grade fever of around 100.  She reports that the sore throat was much worse in the beginning but she has been gargling with salt water which has helped.  She relates that she has been using her albuterol inhaler that she found from the hospitalization last year and it has helped some as well.  She has had fatigue and poor appetite.  She denies any nausea or vomiting.  She denies any urinary symptoms.    Past Medical History:  Diagnosis Date   Anxiety    Arthritis    Diabetes mellitus ORAL MEDS ONLY   Dysrhythmia IRREGULAR  RATE IN HER 20'S   Fibromyalgia    GERD (gastroesophageal reflux disease)    Headache(784.0)    Hypertension    Neuropathy    TOES   Palpitations    OCCASIONAL   Shortness of breath WITH EXERTION   Stress incontinence     Patient Active Problem List   Diagnosis Date Noted   RSV infection 03/12/2022   Hypokalemia 03/12/2022   Hypocalcemia 03/12/2022   Overactive bladder 03/12/2022   Acute hypoxic respiratory failure (HCC) 03/11/2022   Type II or unspecified type diabetes mellitus without mention of complication, uncontrolled 11/08/2013   Acute blood loss anemia 11/08/2013   Hx of total knee arthroplasty 06/14/2013   Hyperlipidemia 06/01/2013   Hypertension 06/01/2013   Depression 06/01/2013   GERD (gastroesophageal reflux disease) 06/01/2013   Diabetes mellitus (HCC) 06/01/2013   Postoperative anemia due to acute blood loss 05/29/2013   Hyponatremia 05/29/2013   OA (osteoarthritis) of knee  05/28/2013    Past Surgical History:  Procedure Laterality Date   ABDOMINAL HYSTERECTOMY  80'S   Partial    ANTERIOR LAT LUMBAR FUSION  03/26/2011   Procedure: ANTERIOR LATERAL LUMBAR FUSION 1 LEVEL;  Surgeon: Tia Alert;  Location: [facility];  Service: Neurosurgery;  Laterality: Left;  Left Lumbar three-four anterolateral retroperitoneal interbody fusion    ARTHROSCOPY KNEE W/ DRILLING  09 LEFT   2012 RIGHT   BARTHOLIN GLAND CYST EXCISION  1983   CHOLECYSTECTOMY  1989   CYSTOSCOPY  80'S   FOOT SURGERY Right    SPINAL FUSION  2012   Lower Back   TOTAL KNEE ARTHROPLASTY Left 05/28/2013   Procedure: LEFT TOTAL KNEE ARTHROPLASTY;  Surgeon: Loanne Drilling, MD;  Location: [facility];  Service: Orthopedics;  Laterality: Left;   TOTAL KNEE ARTHROPLASTY Right 10/29/2013   Procedure: RIGHT TOTAL KNEE ARTHROPLASTY;  Surgeon: Loanne Drilling, MD;  Location: [facility];  Service: Orthopedics;  Laterality: Right;    OB History     Gravida  2   Para  1   Term  1   Preterm      AB  1   Living  1      SAB  1   IAB      Ectopic      Multiple      Live Births  1  Home Medications    Prior to Admission medications   Medication Sig Start Date End Date Taking? Authorizing Provider  albuterol (VENTOLIN HFA) 108 (90 Base) MCG/ACT inhaler Inhale 1-2 puffs into the lungs every 6 (six) hours as needed for wheezing or shortness of breath. 03/15/22   Achille Rich, PA-C  atorvastatin (LIPITOR) 10 MG tablet Take 10 mg by mouth at bedtime.    [provider]  B Complex Vitamins (VITAMIN B COMPLEX) TABS Take 1 tablet by mouth 3 (three) times a week.    [provider]  carboxymethylcellulose (REFRESH PLUS) 0.5 % SOLN Place 1 drop into both eyes 3 (three) times daily as needed (dry eyes).    [provider]  doxycycline (VIBRAMYCIN) 100 MG capsule Take 1 capsule (100 mg total) by mouth 2 (two) times daily. 03/15/22   Achille Rich, PA-C  ELDERBERRY PO  Take 1 each by mouth every other day.    [provider]  leflunomide (ARAVA) 20 MG tablet Take 20 mg by mouth daily.    [provider]  lisinopril (PRINIVIL,ZESTRIL) 10 MG tablet Take 10 mg by mouth every morning.    [provider]  melatonin 5 MG TABS Take 5 mg by mouth at bedtime.    [provider]  metFORMIN (GLUCOPHAGE-XR) 500 MG 24 hr tablet Take 500 mg by mouth 2 (two) times daily with a meal.    [provider]  mirabegron ER (MYRBETRIQ) 25 MG TB24 tablet Take 1 tablet (25 mg total) by mouth daily. 10/27/22   Selmer Dominion, NP  Multiple Vitamin (MULTIVITAMIN) tablet Take 1 tablet by mouth 3 (three) times a week.    [provider]  oxyCODONE-acetaminophen (PERCOCET) 10-325 MG tablet Take 1-2 tablets by mouth daily as needed for pain. 06/15/17   [provider]  pioglitazone (ACTOS) 30 MG tablet TAKE 1 TABLET BY MOUTH EVERY MORNING Patient taking differently: Take 30 mg by mouth daily. 03/18/14   [name], [name], DO  predniSONE (DELTASONE) 10 MG tablet Take 3 tablets (30 mg total) by mouth daily with breakfast. 05/13/22   Wallis Bamberg, PA-C  promethazine-dextromethorphan (PROMETHAZINE-DM) 6.25-15 MG/5ML syrup Take 2.5 mLs by mouth 3 (three) times daily as needed for cough. 05/13/22   Wallis Bamberg, PA-C  sertraline (ZOLOFT) 100 MG tablet Take 100 mg by mouth every morning.    [provider]    Family History Family History  Problem Relation Age of Onset   Depression Mother    Congestive Heart Failure Mother    Allergy (severe) Mother    Heart disease Mother    Alcoholism Mother    Hypertension Mother    Osteoarthritis Mother    Congestive Heart Failure Father    Cancer Father    Depression Maternal Grandmother    Osteoarthritis Maternal Grandmother    Depression Sister    Rheum arthritis Paternal Grandmother    Osteoarthritis Paternal Grandmother    Osteoarthritis Sister    Clotting disorder Sister      Social History Social History   Tobacco Use   Smoking status: Never   Smokeless tobacco: Never  Vaping Use   Vaping status: Never Used  Substance Use Topics   Alcohol use: No   Drug use: No     Allergies   Adhesive [tape], Codeine, Latex, and Chocolate   Review of Systems Review of Systems  Constitutional:  Positive for activity change, appetite change, chills and fever.  HENT:  Positive for congestion and voice  change. Negative for ear pain and sore throat.   Eyes:  Negative for pain and visual disturbance.  Respiratory:  Positive for chest tightness. Negative for cough and shortness of breath.   Cardiovascular:  Negative for chest pain and palpitations.  Gastrointestinal:  Negative for abdominal pain and vomiting.  Genitourinary:  Negative for dysuria and hematuria.  Musculoskeletal:  Negative for arthralgias and back pain.  Skin:  Negative for color change and rash.  Neurological:  Negative for seizures and syncope.  All other systems reviewed and are negative.    Physical Exam Triage Vital Signs ED Triage Vitals  Encounter Vitals Group     BP      Systolic BP Percentile      Diastolic BP Percentile      Pulse      Resp      Temp      Temp src      SpO2      Weight      Height      Head Circumference      Peak Flow      Pain Score      Pain Loc      Pain Education      Exclude from Growth Chart    No data found.  Updated Vital Signs There were no vitals taken for this visit.  Visual Acuity Right Eye Distance:   Left Eye Distance:   Bilateral Distance:    Right Eye Near:   Left Eye Near:    Bilateral Near:     Physical Exam Vitals and nursing note reviewed.  Constitutional:      General: She is not in acute distress.    Appearance: She is well-developed.  HENT:     Head: Normocephalic and atraumatic.     Right Ear: Tympanic membrane and ear canal normal.     Left Ear: Tympanic membrane and ear canal normal.     Nose: Congestion  present.     Mouth/Throat:     Mouth: Mucous membranes are moist.     Pharynx: Posterior oropharyngeal erythema (mild) present.  Eyes:     Conjunctiva/sclera: Conjunctivae normal.  Cardiovascular:     Rate and Rhythm: Normal rate and regular rhythm.     Heart sounds: No murmur heard. Pulmonary:     Effort: Pulmonary effort is normal. No respiratory distress.     Breath sounds: Normal air entry. Examination of the right-upper field reveals wheezing and rhonchi. Examination of the left-upper field reveals wheezing and rhonchi. Examination of the right-middle field reveals wheezing and rhonchi. Examination of the right-lower field reveals wheezing and rhonchi. Wheezing and rhonchi present. No decreased breath sounds.  Abdominal:     Palpations: Abdomen is soft.     Tenderness: There is no abdominal tenderness.  Musculoskeletal:        General: No swelling.     Cervical back: Neck supple.  Skin:    General: Skin is warm and dry.     Capillary Refill: Capillary refill takes less than 2 seconds.  Neurological:     General: No focal deficit present.     Mental Status: She is alert.  Psychiatric:        Mood and Affect: Mood normal.     UC Treatments / Results  Labs (all labs ordered are listed, but only abnormal results are displayed) Labs Reviewed - No data to display  EKG   Radiology No results found.  Procedures Procedures (including critical  care time)  Medications Ordered in UC Medications - No data to display  Initial Impression / Assessment and Plan / UC Course  I have reviewed the triage vital signs and the nursing notes.  Pertinent labs & imaging results that were available during my care of the patient were reviewed by me and considered in my medical decision making (see chart for details).     Atypical pneumonia   Chest x-ray done today. Likely atypical pneumonia especially given previous course. Will treat with the following:  Doxycycline 100mg  bid for 7  days. Take with food Albuterol inhaler 1-2 puffs every 6 hours as needed for wheezing, chest tightness. Rest and hydrate.  Tylenol for fevers/body aches May continue to use salt water gargles for sore throat Follow up with PCP in 5-7 days for pneumonia follow up. Return to urgent care or PCP if symptoms worsen or fail to resolve.    Final Clinical Impressions(s) / UC Diagnoses   Final diagnoses:  None   Discharge Instructions   None    ED Prescriptions   None    PDMP not reviewed this encounter.   Landis Martins, [place] 02/26/23 1924

## 2023-02-26 NOTE — Discharge Instructions (Addendum)
Chest x-ray done today. Likely atypical pneumonia. Will treat with the following:  Doxycycline 100mg  bid for 7 days. Take with food Albuterol inhaler 1-2 puffs every 6 hours as needed for wheezing, chest tightness. Rest and hydrate.  Tylenol for fevers/body aches May continue to use salt water gargles for sore throat Follow up with PCP in 5-7 days for pneumonia follow up. Return to urgent care or PCP if symptoms worsen or fail to resolve.

## 2023-04-06 DIAGNOSIS — G894 Chronic pain syndrome: Secondary | ICD-10-CM | POA: Diagnosis not present

## 2023-04-06 DIAGNOSIS — M47816 Spondylosis without myelopathy or radiculopathy, lumbar region: Secondary | ICD-10-CM | POA: Diagnosis not present

## 2023-05-07 ENCOUNTER — Ambulatory Visit (HOSPITAL_BASED_OUTPATIENT_CLINIC_OR_DEPARTMENT_OTHER)
Admission: RE | Admit: 2023-05-07 | Discharge: 2023-05-07 | Disposition: A | Discharge status: Home / Self Care | Payer: Medicare PPO | Source: Ambulatory Visit | Attending: Internal Medicine | Admitting: Internal Medicine

## 2023-05-07 ENCOUNTER — Ambulatory Visit
Admission: EM | Admit: 2023-05-07 | Discharge: 2023-05-07 | Disposition: A | Discharge status: Home / Self Care | Payer: Medicare PPO | Attending: Internal Medicine | Admitting: Internal Medicine

## 2023-05-07 ENCOUNTER — Encounter: Payer: Self-pay | Admitting: Emergency Medicine

## 2023-05-07 DIAGNOSIS — R0602 Shortness of breath: Secondary | ICD-10-CM | POA: Diagnosis not present

## 2023-05-07 DIAGNOSIS — R059 Cough, unspecified: Secondary | ICD-10-CM | POA: Diagnosis not present

## 2023-05-07 DIAGNOSIS — J209 Acute bronchitis, unspecified: Secondary | ICD-10-CM | POA: Insufficient documentation

## 2023-05-07 DIAGNOSIS — R0789 Other chest pain: Secondary | ICD-10-CM | POA: Diagnosis present

## 2023-05-07 DIAGNOSIS — R062 Wheezing: Secondary | ICD-10-CM | POA: Diagnosis present

## 2023-05-07 MED ORDER — GUAIFENESIN ER 600 MG PO TB12: 600.0000 mg | ORAL_TABLET | Freq: Two times a day (BID) | ORAL | 0 refills | Status: AC

## 2023-05-07 MED ORDER — ALBUTEROL SULFATE HFA 108 (90 BASE) MCG/ACT IN AERS: 1.0000 | INHALATION_SPRAY | Freq: Four times a day (QID) | RESPIRATORY_TRACT | 0 refills | Status: AC | PRN

## 2023-05-07 MED ORDER — IPRATROPIUM-ALBUTEROL 0.5-2.5 (3) MG/3ML IN SOLN
3.0000 mL | Freq: Once | RESPIRATORY_TRACT | Status: AC
  Administered 2023-05-07: 3 mL via RESPIRATORY_TRACT

## 2023-05-07 MED ORDER — DEXAMETHASONE SODIUM PHOSPHATE 10 MG/ML IJ SOLN
10.0000 mg | Freq: Once | INTRAMUSCULAR | Status: AC
  Administered 2023-05-07: 10 mg via INTRAMUSCULAR

## 2023-05-07 NOTE — ED Provider Notes (Signed)
[facility]    CSN: [id number] Arrival date & time: 05/07/23  1315      History   Chief Complaint Chief Complaint  Patient presents with   Cough    HPI Vanessa Frank is a 78 y.o. female.   Patient presents to urgent care for evaluation of cough, nasal congestion, generalized fatigue, and intermittent shortness of breath associated with cough that started yesterday on May 06, 2023.  Her husband and grandchild have both been sick with similar symptoms for the last couple of weeks (her grandchild is 2 and goes to daycare).  Cough is mostly dry and nonproductive.  She states she began to feel wheezing in her chest this morning prompting concern.  Denies chest pain, palpitations, nausea, vomiting, abdominal pain, rash, and dizziness.  No fevers or chills.  Denies leg swelling and orthopnea.  Hospitalized in November 2024 (3 months ago) for pneumonia.  Denies history of chronic respiratory problems.  Never smoker.  Denies recent antibiotic or steroid use in the last 3 months.  She has not attempted use of any over-the-counter or prescription medications to help with symptoms prior to arrival.   Cough   Past Medical History:  Diagnosis Date   Anxiety    Arthritis    Diabetes mellitus ORAL MEDS ONLY   Dysrhythmia IRREGULAR  RATE IN HER 20'S   Fibromyalgia    GERD (gastroesophageal reflux disease)    Headache(784.0)    Hypertension    Neuropathy    TOES   Palpitations    OCCASIONAL   Shortness of breath WITH EXERTION   Stress incontinence     Patient Active Problem List   Diagnosis Date Noted   RSV infection 03/12/2022   Hypokalemia 03/12/2022   Hypocalcemia 03/12/2022   Overactive bladder 03/12/2022   Acute hypoxic respiratory failure (HCC) 03/11/2022   Type II or unspecified type diabetes mellitus without mention of complication, uncontrolled 11/08/2013   Acute blood loss anemia 11/08/2013   Hx of total knee arthroplasty 06/14/2013   Hyperlipidemia  06/01/2013   Hypertension 06/01/2013   Depression 06/01/2013   GERD (gastroesophageal reflux disease) 06/01/2013   Diabetes mellitus (HCC) 06/01/2013   Postoperative anemia due to acute blood loss 05/29/2013   Hyponatremia 05/29/2013   OA (osteoarthritis) of knee 05/28/2013    Past Surgical History:  Procedure Laterality Date   ABDOMINAL HYSTERECTOMY  80'S   Partial    ANTERIOR LAT LUMBAR FUSION  03/26/2011   Procedure: ANTERIOR LATERAL LUMBAR FUSION 1 LEVEL;  Surgeon: Tia Alert;  Location: [facility];  Service: Neurosurgery;  Laterality: Left;  Left Lumbar three-four anterolateral retroperitoneal interbody fusion    ARTHROSCOPY KNEE W/ DRILLING  09 LEFT   2012 RIGHT   BARTHOLIN GLAND CYST EXCISION  1983   CHOLECYSTECTOMY  1989   CYSTOSCOPY  80'S   FOOT SURGERY Right    SPINAL FUSION  2012   Lower Back   TOTAL KNEE ARTHROPLASTY Left 05/28/2013   Procedure: LEFT TOTAL KNEE ARTHROPLASTY;  Surgeon: Loanne Drilling, MD;  Location: [facility];  Service: Orthopedics;  Laterality: Left;   TOTAL KNEE ARTHROPLASTY Right 10/29/2013   Procedure: RIGHT TOTAL KNEE ARTHROPLASTY;  Surgeon: Loanne Drilling, MD;  Location: [facility];  Service: Orthopedics;  Laterality: Right;    OB History     Gravida  2   Para  1   Term  1   Preterm      AB  1   Living  1  SAB  1   IAB      Ectopic      Multiple      Live Births  1            Home Medications    Prior to Admission medications   Medication Sig Start Date End Date Taking? Authorizing Provider  guaiFENesin (MUCINEX) 600 MG 12 hr tablet Take 1 tablet (600 mg total) by mouth 2 (two) times daily. 05/07/23  Yes Carlisle Beers, FNP  albuterol (VENTOLIN HFA) 108 (90 Base) MCG/ACT inhaler Inhale 1-2 puffs into the lungs every 6 (six) hours as needed for wheezing or shortness of breath. 05/07/23   Carlisle Beers, FNP  atorvastatin (LIPITOR) 10 MG tablet Take 10 mg by mouth at bedtime.    [provider]  B  Complex Vitamins (VITAMIN B COMPLEX) TABS Take 1 tablet by mouth 3 (three) times a week.    [provider]  carboxymethylcellulose (REFRESH PLUS) 0.5 % SOLN Place 1 drop into both eyes 3 (three) times daily as needed (dry eyes).    [provider]  ELDERBERRY PO Take 1 each by mouth every other day.    [provider]  leflunomide (ARAVA) 20 MG tablet Take 20 mg by mouth daily.    [provider]  lisinopril (PRINIVIL,ZESTRIL) 10 MG tablet Take 10 mg by mouth every morning.    [provider]  melatonin 5 MG TABS Take 5 mg by mouth at bedtime.    [provider]  metFORMIN (GLUCOPHAGE-XR) 500 MG 24 hr tablet Take 500 mg by mouth 2 (two) times daily with a meal.    [provider]  mirabegron ER (MYRBETRIQ) 25 MG TB24 tablet Take 1 tablet (25 mg total) by mouth daily. 10/27/22   Selmer Dominion, NP  Multiple Vitamin (MULTIVITAMIN) tablet Take 1 tablet by mouth 3 (three) times a week.    [provider]  oxyCODONE-acetaminophen (PERCOCET) 10-325 MG tablet Take 1-2 tablets by mouth daily as needed for pain. 06/15/17   [provider]  pioglitazone (ACTOS) 30 MG tablet TAKE 1 TABLET BY MOUTH EVERY MORNING Patient taking differently: Take 30 mg by mouth daily. 03/18/14   [name], [name], DO  predniSONE (DELTASONE) 10 MG tablet Take 3 tablets (30 mg total) by mouth daily with breakfast. Patient not taking: Reported on 05/07/2023 05/13/22   Wallis Bamberg, PA-C  promethazine-dextromethorphan (PROMETHAZINE-DM) 6.25-15 MG/5ML syrup Take 2.5 mLs by mouth 3 (three) times daily as needed for cough. Patient not taking: Reported on 05/07/2023 05/13/22   Wallis Bamberg, PA-C  sertraline (ZOLOFT) 100 MG tablet Take 100 mg by mouth every morning.    [provider]    Family History Family History  Problem Relation Age of Onset   Depression Mother    Congestive Heart Failure Mother    Allergy (severe) Mother    Heart disease  Mother    Alcoholism Mother    Hypertension Mother    Osteoarthritis Mother    Congestive Heart Failure Father    Cancer Father    Depression Maternal Grandmother    Osteoarthritis Maternal Grandmother    Depression Sister    Rheum arthritis Paternal Grandmother    Osteoarthritis Paternal Grandmother    Osteoarthritis Sister    Clotting disorder Sister     Social History Social History   Tobacco Use   Smoking status: Never   Smokeless tobacco: Never  Vaping Use   Vaping status: Never Used  Substance Use Topics   Alcohol use: No   Drug use: No     Allergies   Adhesive [tape], Codeine, Latex, and Chocolate   Review of Systems Review of Systems  Respiratory:  Positive for cough.   Per HPI   Physical Exam Triage Vital Signs ED Triage Vitals  Encounter Vitals Group     BP 05/07/23 1409 (!) 143/85     Systolic BP Percentile --      Diastolic BP Percentile --      Pulse Rate 05/07/23 1409 95     Resp 05/07/23 1409 17     Temp 05/07/23 1409 98.3 F (36.8 C)     Temp Source 05/07/23 1409 Oral     SpO2 05/07/23 1409 94 %     Weight --      Height --      Head Circumference --      Peak Flow --      Pain Score 05/07/23 1406 4     Pain Loc --      Pain Education --      Exclude from Growth Chart --    No data found.  Updated Vital Signs BP (!) 143/85 (BP Location: Right Arm)   Pulse 95   Temp 98.3 F (36.8 C) (Oral)   Resp 17   SpO2 94%   Visual Acuity Right Eye Distance:   Left Eye Distance:   Bilateral Distance:    Right Eye Near:   Left Eye Near:    Bilateral Near:     Physical Exam Vitals and nursing note reviewed.  Constitutional:      Appearance: She is not ill-appearing or toxic-appearing.  HENT:     Head: Normocephalic and atraumatic.     Right Ear: Hearing, tympanic membrane, ear canal and external ear normal.     Left Ear: Hearing, tympanic membrane, ear canal and external ear normal.     Nose: Congestion present.      Mouth/Throat:     Lips: Pink.     Mouth: Mucous membranes are moist. No injury or oral lesions.     Dentition: Normal dentition.     Tongue: No lesions.     Pharynx: Oropharynx is clear. Uvula midline. Posterior oropharyngeal erythema present. No pharyngeal swelling, oropharyngeal exudate, uvula swelling or postnasal drip.     Tonsils: No tonsillar exudate.     Comments: Mild erythema to posterior oropharynx with small amount of clear postnasal drainage visualized. Eyes:     General: Lids are normal. Vision grossly intact. Gaze aligned appropriately.     Extraocular Movements: Extraocular movements intact.     Conjunctiva/sclera: Conjunctivae normal.  Neck:     Trachea: Trachea and phonation normal.  Cardiovascular:     Rate and Rhythm: Normal rate and regular rhythm.     Heart sounds: Normal heart sounds, S1 normal and S2 normal.  Pulmonary:     Effort: Pulmonary effort is normal. No respiratory distress.     Breath sounds: Normal breath sounds and air entry.  Musculoskeletal:     Cervical back: Neck supple.     Right lower leg: No edema.     Left lower leg: No edema.  Lymphadenopathy:     Cervical: No cervical adenopathy.  Skin:    General: Skin is warm and dry.     Capillary Refill: Capillary refill takes less than 2 seconds.     Findings: No rash.  Neurological:     General: No focal  deficit present.     Mental Status: She is alert and oriented to person, place, and time. Mental status is at baseline.     Cranial Nerves: No dysarthria or facial asymmetry.  Psychiatric:        Mood and Affect: Mood normal.        Speech: Speech normal.        Behavior: Behavior normal.        Thought Content: Thought content normal.        Judgment: Judgment normal.      UC Treatments / Results  Labs (all labs ordered are listed, but only abnormal results are displayed) Labs Reviewed  SARS CORONAVIRUS 2 (TAT 6-24 HRS)    EKG   Radiology No results  found.  Procedures Procedures (including critical care time)  Medications Ordered in UC Medications  ipratropium-albuterol (DUONEB) 0.5-2.5 (3) MG/3ML nebulizer solution 3 mL (3 mLs Nebulization Given 05/07/23 1518)  dexamethasone (DECADRON) injection 10 mg (10 mg Intramuscular Given 05/07/23 1515)    Initial Impression / Assessment and Plan / UC Course  I have reviewed the triage vital signs and the nursing notes.  Pertinent labs & imaging results that were available during my care of the patient were reviewed by me and considered in my medical decision making (see chart for details).   1. Acute bronchitis, shortness of breath Evaluation suggests viral bronchitis, however chest x-ray ordered given clinical concern for underlying focal consolidation/pneumonia. Staff will call if chest x-ray is abnormal or if results require change in current treatment plan.   Interventions in clinic: Duoneb breathing treatment administered with improvement in lung sounds on reassessment. Dexamethasone 10mg  IM given. Last HA1C on file in 2023 (1 year ago) was 6.1. Recent blood sugars on CMPs have been in the 150s-200s. I'd like to avoid spikes in blood sugars, therefore will defer prednisone burst at this time and treat only with dexamethasone IM in clinic.   Strep/viral testing: COVID PCR testing pending. She is a candidate for antiviral therapy if positive.   Recommend treatment with steroid, bronchodilator, cough suppressants for symptomatic relief, and expectorants (mucinex) as needed- see AVS.    Counseled patient on potential for adverse effects with medications prescribed/recommended today, strict ER and return-to-clinic precautions discussed, patient verbalized understanding.    Final Clinical Impressions(s) / UC Diagnoses   Final diagnoses:  Acute bronchitis, unspecified organism  Shortness of breath     Discharge Instructions      Please go to med [facility] and have x-ray  performed. Do not check in to the ER. Go to imaging department, get images performed, then go home. You will receive a phone call if the x-ray shows any abnormal results requiring further treatment. If the x-ray results do not change our treatment plan, you will not receive a phone call.  Also see these results on MyChart.  Med [facility] [address] [place], [place]   You have bronchitis which is inflammation of the upper airways in your lungs due to a virus. The following medicines will help with your symptoms.   COVID testing will come back in the next 12 to 24 hours and staff will call you if it is positive.  You will also see result on MyChart.  -I gave you a steroid shot in the clinic to help with the wheezing. - You may use albuterol inhaler 1 to 2 puffs every 4-6 hours as needed for cough, shortness of breath, and wheezing. -  Take cough medicines as needed. - Use mucinex to break up congestion in nose/chest (guaifenesin 600mg  every 12 hours as needed).   If you develop any new or worsening symptoms or do not improve in the next 2 to 3 days, please return.  If your symptoms are severe, please go to the emergency room. Follow-up with PCP as needed.     ED Prescriptions     Medication Sig Dispense Auth. Provider   albuterol (VENTOLIN HFA) 108 (90 Base) MCG/ACT inhaler Inhale 1-2 puffs into the lungs every 6 (six) hours as needed for wheezing or shortness of breath. 1 each Carlisle Beers, FNP   guaiFENesin (MUCINEX) 600 MG 12 hr tablet Take 1 tablet (600 mg total) by mouth 2 (two) times daily. 30 tablet Carlisle Beers, FNP      PDMP not reviewed this encounter.   Carlisle Beers, [place] 05/07/23 1541

## 2023-05-07 NOTE — Discharge Instructions (Addendum)
Please go to med [facility] and have x-ray performed. Do not check in to the ER. Go to imaging department, get images performed, then go home. You will receive a phone call if the x-ray shows any abnormal results requiring further treatment. If the x-ray results do not change our treatment plan, you will not receive a phone call.  Also see these results on MyChart.  Med [facility] [address] [place], [place]   You have bronchitis which is inflammation of the upper airways in your lungs due to a virus. The following medicines will help with your symptoms.   COVID testing will come back in the next 12 to 24 hours and staff will call you if it is positive.  You will also see result on MyChart.  -I gave you a steroid shot in the clinic to help with the wheezing. - You may use albuterol inhaler 1 to 2 puffs every 4-6 hours as needed for cough, shortness of breath, and wheezing. - Take cough medicines as needed. - Use mucinex to break up congestion in nose/chest (guaifenesin 600mg  every 12 hours as needed).   If you develop any new or worsening symptoms or do not improve in the next 2 to 3 days, please return.  If your symptoms are severe, please go to the emergency room. Follow-up with PCP as needed.

## 2023-05-07 NOTE — ED Triage Notes (Signed)
Pt c/o cough, chest tightness, pain between shoulder blades, and wheezing since Friday

## 2023-05-07 NOTE — ED Notes (Signed)
PT seen at [facility] urgent care. She is to go to [facility] for X-ray.

## 2023-05-08 LAB — SARS CORONAVIRUS 2 (TAT 6-24 HRS): SARS Coronavirus 2: NEGATIVE

## 2023-05-12 ENCOUNTER — Other Ambulatory Visit: Payer: Self-pay

## 2023-05-12 ENCOUNTER — Encounter (HOSPITAL_BASED_OUTPATIENT_CLINIC_OR_DEPARTMENT_OTHER): Payer: Self-pay | Admitting: *Deleted

## 2023-05-12 ENCOUNTER — Emergency Department (HOSPITAL_BASED_OUTPATIENT_CLINIC_OR_DEPARTMENT_OTHER)
Admission: EM | Admit: 2023-05-12 | Discharge: 2023-05-13 | Disposition: A | Discharge status: Home / Self Care | Payer: Medicare PPO | Attending: Emergency Medicine | Admitting: Emergency Medicine

## 2023-05-12 ENCOUNTER — Emergency Department (HOSPITAL_BASED_OUTPATIENT_CLINIC_OR_DEPARTMENT_OTHER): Payer: Medicare PPO

## 2023-05-12 DIAGNOSIS — Z79899 Other long term (current) drug therapy: Secondary | ICD-10-CM | POA: Insufficient documentation

## 2023-05-12 DIAGNOSIS — I700 Atherosclerosis of aorta: Secondary | ICD-10-CM | POA: Diagnosis not present

## 2023-05-12 DIAGNOSIS — E119 Type 2 diabetes mellitus without complications: Secondary | ICD-10-CM | POA: Insufficient documentation

## 2023-05-12 DIAGNOSIS — R0602 Shortness of breath: Secondary | ICD-10-CM | POA: Diagnosis not present

## 2023-05-12 DIAGNOSIS — R058 Other specified cough: Secondary | ICD-10-CM | POA: Diagnosis not present

## 2023-05-12 DIAGNOSIS — Z7984 Long term (current) use of oral hypoglycemic drugs: Secondary | ICD-10-CM | POA: Diagnosis not present

## 2023-05-12 DIAGNOSIS — J189 Pneumonia, unspecified organism: Secondary | ICD-10-CM | POA: Diagnosis not present

## 2023-05-12 DIAGNOSIS — Z91040 Latex allergy status: Secondary | ICD-10-CM | POA: Insufficient documentation

## 2023-05-12 DIAGNOSIS — R059 Cough, unspecified: Secondary | ICD-10-CM | POA: Diagnosis not present

## 2023-05-12 DIAGNOSIS — Z20822 Contact with and (suspected) exposure to covid-19: Secondary | ICD-10-CM | POA: Insufficient documentation

## 2023-05-12 DIAGNOSIS — I2510 Atherosclerotic heart disease of native coronary artery without angina pectoris: Secondary | ICD-10-CM | POA: Diagnosis not present

## 2023-05-12 DIAGNOSIS — R918 Other nonspecific abnormal finding of lung field: Secondary | ICD-10-CM | POA: Diagnosis not present

## 2023-05-12 DIAGNOSIS — I10 Essential (primary) hypertension: Secondary | ICD-10-CM | POA: Diagnosis not present

## 2023-05-12 DIAGNOSIS — J181 Lobar pneumonia, unspecified organism: Secondary | ICD-10-CM | POA: Diagnosis not present

## 2023-05-12 LAB — BASIC METABOLIC PANEL
Anion gap: 13 (ref 5–15)
BUN: 24 mg/dL — ABNORMAL HIGH (ref 8–23)
CO2: 19 mmol/L — ABNORMAL LOW (ref 22–32)
Calcium: 9.2 mg/dL (ref 8.9–10.3)
Chloride: 102 mmol/L (ref 98–111)
Creatinine, Ser: 1.12 mg/dL — ABNORMAL HIGH (ref 0.44–1.00)
GFR, Estimated: 50 mL/min — ABNORMAL LOW (ref >60)
Glucose, Bld: 129 mg/dL — ABNORMAL HIGH (ref 70–99)
Potassium: 4 mmol/L (ref 3.5–5.1)
Sodium: 134 mmol/L — ABNORMAL LOW (ref 135–145)

## 2023-05-12 LAB — CBC
HCT: 37.6 % (ref 36.0–46.0)
Hemoglobin: 12.6 g/dL (ref 12.0–15.0)
MCH: 28.6 pg (ref 26.0–34.0)
MCHC: 33.5 g/dL (ref 30.0–36.0)
MCV: 85.5 fL (ref 80.0–100.0)
Platelets: 278 10*3/uL (ref 150–400)
RBC: 4.4 MIL/uL (ref 3.87–5.11)
RDW: 13.4 % (ref 11.5–15.5)
WBC: 8.7 10*3/uL (ref 4.0–10.5)
nRBC: 0 % (ref 0.0–0.2)

## 2023-05-12 LAB — RESP PANEL BY RT-PCR (RSV, FLU A&B, COVID)  RVPGX2
Influenza A by PCR: NEGATIVE
Influenza B by PCR: NEGATIVE
Resp Syncytial Virus by PCR: NEGATIVE
SARS Coronavirus 2 by RT PCR: NEGATIVE

## 2023-05-12 MED ORDER — ALBUTEROL SULFATE HFA 108 (90 BASE) MCG/ACT IN AERS
2.0000 | INHALATION_SPRAY | RESPIRATORY_TRACT | Status: DC | PRN
  Administered 2023-05-12: 2 via RESPIRATORY_TRACT
  Filled 2023-05-12: qty 6.7

## 2023-05-12 NOTE — ED Triage Notes (Signed)
Pt is here for evaluation of cough x 1 week.  She has been having sob and fatigue.  Cough has been productive.

## 2023-05-13 ENCOUNTER — Emergency Department (HOSPITAL_BASED_OUTPATIENT_CLINIC_OR_DEPARTMENT_OTHER): Payer: Medicare PPO

## 2023-05-13 DIAGNOSIS — I2510 Atherosclerotic heart disease of native coronary artery without angina pectoris: Secondary | ICD-10-CM | POA: Diagnosis not present

## 2023-05-13 DIAGNOSIS — R918 Other nonspecific abnormal finding of lung field: Secondary | ICD-10-CM | POA: Diagnosis not present

## 2023-05-13 DIAGNOSIS — R058 Other specified cough: Secondary | ICD-10-CM | POA: Diagnosis not present

## 2023-05-13 DIAGNOSIS — R0602 Shortness of breath: Secondary | ICD-10-CM | POA: Diagnosis not present

## 2023-05-13 LAB — BRAIN NATRIURETIC PEPTIDE: B Natriuretic Peptide: 75.4 pg/mL (ref 0.0–100.0)

## 2023-05-13 LAB — TROPONIN I (HIGH SENSITIVITY): Troponin I (High Sensitivity): 7 ng/L (ref <18)

## 2023-05-13 LAB — D-DIMER, QUANTITATIVE: D-Dimer, Quant: 0.8 ug{FEU}/mL — ABNORMAL HIGH (ref 0.00–0.50)

## 2023-05-13 MED ORDER — AZITHROMYCIN 250 MG PO TABS
500.0000 mg | ORAL_TABLET | Freq: Once | ORAL | Status: AC
Start: 2023-05-13 — End: 2023-05-13
  Administered 2023-05-13: 500 mg via ORAL
  Filled 2023-05-13: qty 2

## 2023-05-13 MED ORDER — IOHEXOL 350 MG/ML SOLN
75.0000 mL | Freq: Once | INTRAVENOUS | Status: AC | PRN
  Administered 2023-05-13: 75 mL via INTRAVENOUS

## 2023-05-13 MED ORDER — CEFTRIAXONE SODIUM 1 G IJ SOLR
1.0000 g | Freq: Once | INTRAMUSCULAR | Status: AC
  Administered 2023-05-13: 1 g via INTRAVENOUS
  Filled 2023-05-13: qty 10

## 2023-05-13 MED ORDER — BENZONATATE 100 MG PO CAPS: 100.0000 mg | ORAL_CAPSULE | Freq: Three times a day (TID) | ORAL | 0 refills | Status: AC | PRN

## 2023-05-13 MED ORDER — AMOXICILLIN-POT CLAVULANATE 875-125 MG PO TABS
1.0000 | ORAL_TABLET | Freq: Two times a day (BID) | ORAL | 0 refills | Status: AC
Start: 2023-05-13 — End: ?

## 2023-05-13 MED ORDER — AZITHROMYCIN 250 MG PO TABS: 250.0000 mg | ORAL_TABLET | Freq: Every day | ORAL | 0 refills | Status: AC

## 2023-05-13 NOTE — ED Notes (Signed)
ED Provider at bedside.

## 2023-05-13 NOTE — ED Provider Notes (Signed)
[facility] Provider Note   CSN: [id number] Arrival date & time: 05/12/23  1809     History  Chief Complaint  Patient presents with   Cough    Vanessa Frank is a 78 y.o. female.  The history is provided by the patient, a relative and medical records.  Cough Vanessa Frank is a 78 y.o. female who presents to the Emergency Department complaining of cough.  She presents to the ED accompanied by her daughter in law for evaluation of 1 week of cough.  She has associated pain between her shoulders and central chest pain.  She also reports intermittent bilateral lower extremity edema for several months.  She recently had a temp to 99.  Cough is productive of green sputum.  Her heart rate has been running higher than baseline.  Baseline heart rate is 60-70 and over the last week it has been running over 100.  2 weeks ago her husband was admitted to the hospital for pneumonia.  Hx/o DM, HTN No hx/o CHF, COPD No hx/o DVT/PE.     Home Medications Prior to Admission medications   Medication Sig Start Date End Date Taking? Authorizing Provider  amoxicillin-clavulanate (AUGMENTIN) 875-125 MG tablet Take 1 tablet by mouth every 12 (twelve) hours. 05/13/23  Yes Tilden Fossa, MD  azithromycin (ZITHROMAX) 250 MG tablet Take 1 tablet (250 mg total) by mouth daily. 05/13/23  Yes Tilden Fossa, MD  benzonatate (TESSALON) 100 MG capsule Take 1 capsule (100 mg total) by mouth 3 (three) times daily as needed. 05/13/23  Yes Tilden Fossa, MD  albuterol (VENTOLIN HFA) 108 (90 Base) MCG/ACT inhaler Inhale 1-2 puffs into the lungs every 6 (six) hours as needed for wheezing or shortness of breath. 05/07/23   Carlisle Beers, FNP  atorvastatin (LIPITOR) 10 MG tablet Take 10 mg by mouth at bedtime.    [provider]  B Complex Vitamins (VITAMIN B COMPLEX) TABS Take 1 tablet by mouth 3 (three) times a week.    [provider]   carboxymethylcellulose (REFRESH PLUS) 0.5 % SOLN Place 1 drop into both eyes 3 (three) times daily as needed (dry eyes).    [provider]  ELDERBERRY PO Take 1 each by mouth every other day.    [provider]  guaiFENesin (MUCINEX) 600 MG 12 hr tablet Take 1 tablet (600 mg total) by mouth 2 (two) times daily. 05/07/23   Carlisle Beers, FNP  leflunomide (ARAVA) 20 MG tablet Take 20 mg by mouth daily.    [provider]  lisinopril (PRINIVIL,ZESTRIL) 10 MG tablet Take 10 mg by mouth every morning.    [provider]  melatonin 5 MG TABS Take 5 mg by mouth at bedtime.    [provider]  metFORMIN (GLUCOPHAGE-XR) 500 MG 24 hr tablet Take 500 mg by mouth 2 (two) times daily with a meal.    [provider]  mirabegron ER (MYRBETRIQ) 25 MG TB24 tablet Take 1 tablet (25 mg total) by mouth daily. 10/27/22   Selmer Dominion, NP  Multiple Vitamin (MULTIVITAMIN) tablet Take 1 tablet by mouth 3 (three) times a week.    [provider]  oxyCODONE-acetaminophen (PERCOCET) 10-325 MG tablet Take 1-2 tablets by mouth daily as needed for pain. 06/15/17   [provider]  pioglitazone (ACTOS) 30 MG tablet TAKE 1 TABLET BY MOUTH EVERY MORNING Patient taking differently: Take 30 mg by mouth daily. 03/18/14   Renato Gails,  [name], DO  predniSONE (DELTASONE) 10 MG tablet Take 3 tablets (30 mg total) by mouth daily with breakfast. Patient not taking: Reported on 05/07/2023 05/13/22   Wallis Bamberg, PA-C  promethazine-dextromethorphan (PROMETHAZINE-DM) 6.25-15 MG/5ML syrup Take 2.5 mLs by mouth 3 (three) times daily as needed for cough. Patient not taking: Reported on 05/07/2023 05/13/22   Wallis Bamberg, PA-C  sertraline (ZOLOFT) 100 MG tablet Take 100 mg by mouth every morning.    [provider]      Allergies    Adhesive [tape], Codeine, Latex, and Chocolate    Review of Systems   Review of Systems  Respiratory:  Positive for cough.    All other systems reviewed and are negative.   Physical Exam Updated Vital Signs BP 123/74   Pulse (!) 103   Temp 99 F (37.2 C)   Resp 18   SpO2 95%  Physical Exam Vitals and nursing note reviewed.  Constitutional:      Appearance: She is well-developed.  HENT:     Head: Normocephalic and atraumatic.  Cardiovascular:     Rate and Rhythm: Regular rhythm. Tachycardia present.     Heart sounds: No murmur heard. Pulmonary:     Effort: Pulmonary effort is normal. No respiratory distress.     Comments: Diffuse rhonchi Abdominal:     Palpations: Abdomen is soft.     Tenderness: There is no abdominal tenderness. There is no guarding or rebound.  Musculoskeletal:        General: No tenderness.     Comments: Nonpitting edema to BLE  Skin:    General: Skin is warm and dry.  Neurological:     Mental Status: She is alert and oriented to person, place, and time.  Psychiatric:        Behavior: Behavior normal.     ED Results / Procedures / Treatments   Labs (all labs ordered are listed, but only abnormal results are displayed) Labs Reviewed  BASIC METABOLIC PANEL - Abnormal; Notable for the following components:      Result Value   Sodium 134 (*)    CO2 19 (*)    Glucose, Bld 129 (*)    BUN 24 (*)    Creatinine, Ser 1.12 (*)    GFR, Estimated 50 (*)    All other components within normal limits  D-DIMER, QUANTITATIVE - Abnormal; Notable for the following components:   D-Dimer, Quant 0.80 (*)    All other components within normal limits  RESP PANEL BY RT-PCR (RSV, FLU A&B, COVID)  RVPGX2  CBC  BRAIN NATRIURETIC PEPTIDE  TROPONIN I (HIGH SENSITIVITY)    EKG None  Radiology CT Angio Chest PE W/Cm &/Or Wo Cm Result Date: 05/13/2023 CLINICAL DATA:  Productive cough, shortness of breath, fatigue. Positive D-dimer. EXAM: CT ANGIOGRAPHY CHEST WITH CONTRAST TECHNIQUE: Multidetector CT imaging of the chest was performed using the standard protocol during bolus administration  of intravenous contrast. Multiplanar CT image reconstructions and MIPs were obtained to evaluate the vascular anatomy. RADIATION DOSE REDUCTION: This exam was performed according to the departmental dose-optimization program which includes automated exposure control, adjustment of the mA and/or kV according to patient size and/or use of iterative reconstruction technique. CONTRAST:  75mL OMNIPAQUE IOHEXOL 350 MG/ML SOLN COMPARISON:  03/15/2022 FINDINGS: Cardiovascular: Heart is normal size. Aorta is normal caliber. Coronary artery and aortic atherosclerosis. No filling defects in the pulmonary arteries to suggest pulmonary emboli. Mediastinum/Nodes: No mediastinal, hilar, or axillary adenopathy. Trachea and esophagus are unremarkable.  Thyroid unremarkable. Lungs/Pleura: Mild peribronchial thickening in the lower lobes. Ground-glass nodular opacities in the right lung base could reflect atelectasis or early infiltrate. No effusions. Upper Abdomen: Calcifications in the liver and spleen compatible with old granulomatous disease. No acute findings. Musculoskeletal: Chest wall soft tissues are unremarkable. No acute bony abnormality. Review of the MIP images confirms the above findings. IMPRESSION: No evidence of pulmonary embolus. Coronary artery disease. Airway thickening in the lower lobes. Ground-glass nodular opacities in the right lung base could reflect atelectasis or early infiltrates. Aortic Atherosclerosis (ICD10-I70.0). Electronically Signed   By: Charlett Nose M.D.   On: 05/13/2023 01:27   DG Chest 2 View Result Date: 05/12/2023 CLINICAL DATA:  Cough and shortness of breath. Cough for 1 week. Shortness of breath and fatigue. EXAM: CHEST - 2 VIEW COMPARISON:  05/07/2023 FINDINGS: Heart size and pulmonary vascularity are normal. Peribronchial thickening may indicate acute or chronic bronchitis. No airspace disease or consolidation in the lungs. No pleural effusions. No pneumothorax. Mediastinal contours  appear intact. Calcification of the aorta. Degenerative changes in the spine and shoulders. Mild thoracic scoliosis convex towards the right. Postoperative changes in the lumbar spine. Surgical clips in the right upper quadrant. IMPRESSION: Bilateral peribronchial thickening may represent acute or chronic bronchitis. Electronically Signed   By: Burman Nieves M.D.   On: 05/12/2023 18:46    Procedures Procedures    Medications Ordered in ED Medications  albuterol (VENTOLIN HFA) 108 (90 Base) MCG/ACT inhaler 2 puff (2 puffs Inhalation Given 05/12/23 2327)  iohexol (OMNIPAQUE) 350 MG/ML injection 75 mL (75 mLs Intravenous Contrast Given 05/13/23 0117)  cefTRIAXone (ROCEPHIN) 1 g in sodium chloride 0.9 % 100 mL IVPB (0 g Intravenous Stopped 05/13/23 0329)  azithromycin (ZITHROMAX) tablet 500 mg (500 mg Oral Given 05/13/23 0258)    ED Course/ Medical Decision Making/ A&P                                 Medical Decision Making Amount and/or Complexity of Data Reviewed Labs: ordered. Radiology: ordered.  Risk Prescription drug management.   Patient here for evaluation of cough for the last week.  She has mild tachycardia on evaluation with occasional rhonchi on examination.  There is no respiratory distress.  Chest x-ray with bronchitic changes she is negative for COVID, flu, RSV.  D-dimer was mildly elevated.  CTA with possible developing pneumonia.  Will start on antibiotics given her symptoms.  BNP is within normal limits, doubt acute heart failure.  No evidence of sepsis at this time.  Presentation is not consistent with ACS.  Feel patient is stable for discharge home with outpatient follow-up and return precautions.        Final Clinical Impression(s) / ED Diagnoses Final diagnoses:  Community acquired pneumonia of right lower lobe of lung    Rx / DC Orders ED Discharge Orders          Ordered    azithromycin (ZITHROMAX) 250 MG tablet  Daily        05/13/23 0226     benzonatate (TESSALON) 100 MG capsule  3 times daily PRN        05/13/23 0226    amoxicillin-clavulanate (AUGMENTIN) 875-125 MG tablet  Every 12 hours        05/13/23 0226              Tilden Fossa, MD 05/13/23 [phone]

## 2023-05-13 NOTE — ED Notes (Signed)
Patient transported to CT

## 2023-05-16 DIAGNOSIS — I2510 Atherosclerotic heart disease of native coronary artery without angina pectoris: Secondary | ICD-10-CM | POA: Diagnosis not present

## 2023-05-16 DIAGNOSIS — E1169 Type 2 diabetes mellitus with other specified complication: Secondary | ICD-10-CM | POA: Diagnosis not present

## 2023-05-16 DIAGNOSIS — J189 Pneumonia, unspecified organism: Secondary | ICD-10-CM | POA: Diagnosis not present

## 2023-05-16 DIAGNOSIS — I10 Essential (primary) hypertension: Secondary | ICD-10-CM | POA: Diagnosis not present

## 2023-05-31 DIAGNOSIS — M47816 Spondylosis without myelopathy or radiculopathy, lumbar region: Secondary | ICD-10-CM | POA: Diagnosis not present

## 2023-07-13 DIAGNOSIS — M542 Cervicalgia: Secondary | ICD-10-CM | POA: Diagnosis not present

## 2023-07-13 DIAGNOSIS — G894 Chronic pain syndrome: Secondary | ICD-10-CM | POA: Diagnosis not present

## 2023-07-13 DIAGNOSIS — M47816 Spondylosis without myelopathy or radiculopathy, lumbar region: Secondary | ICD-10-CM | POA: Diagnosis not present

## 2023-07-19 ENCOUNTER — Ambulatory Visit: Attending: Internal Medicine | Admitting: Internal Medicine

## 2023-07-19 ENCOUNTER — Encounter: Payer: Self-pay | Admitting: Internal Medicine

## 2023-07-19 VITALS — BP 118/62 | HR 89 | Ht 62.0 in | Wt 196.0 lb

## 2023-07-19 DIAGNOSIS — E119 Type 2 diabetes mellitus without complications: Secondary | ICD-10-CM | POA: Diagnosis not present

## 2023-07-19 DIAGNOSIS — E785 Hyperlipidemia, unspecified: Secondary | ICD-10-CM | POA: Diagnosis not present

## 2023-07-19 DIAGNOSIS — I2510 Atherosclerotic heart disease of native coronary artery without angina pectoris: Secondary | ICD-10-CM | POA: Diagnosis not present

## 2023-07-19 MED ORDER — ATORVASTATIN CALCIUM 40 MG PO TABS: 40.0000 mg | ORAL_TABLET | Freq: Every day | ORAL | 3 refills | Status: DC

## 2023-07-19 NOTE — Patient Instructions (Signed)
 Medication Instructions:  INCREASE atorvastatin to 40mg  once daily  *If you need a refill on your cardiac medications before your next appointment, please call your pharmacy*  Lab Work: FASTING lab work in 3-4 months  If you have labs (blood work) drawn today and your tests are completely normal, you will receive your results only by: [employer] (if you have MyChart) OR A paper copy in the mail If you have any lab test that is abnormal or we need to change your treatment, we will call you to review the results.   Follow-Up: At [facility], you and your health needs are our priority.  As part of our continuing mission to provide you with exceptional heart care, our providers are all part of one team.  This team includes your primary Cardiologist (physician) and Advanced Practice Providers or APPs (Physician Assistants and Nurse Practitioners) who all work together to provide you with the care you need, when you need it.  Your next appointment:   3-4 months with Eligha Bridegroom NP - lipid clinic [address] [place], [place] [address]   We recommend signing up for the patient portal called "MyChart".  Sign up information is provided on this After Visit Summary.  MyChart is used to connect with patients for Virtual Visits (Telemedicine).  Patients are able to view lab/test results, encounter notes, upcoming appointments, etc.  Non-urgent messages can be sent to your provider as well.   To learn more about what you can do with MyChart, go to [email or web address].     Dr. Blanchie Dessert new office building as of 08/15/23      1st Floor: - Lobby - Registration  - Pharmacy  - Lab - Cafe  2nd Floor: - PV Lab - Diagnostic Testing (echo, CT, nuclear med)  3rd Floor: - Vacant  4th Floor: - TCTS (cardiothoracic surgery) - AFib Clinic - Structural Heart Clinic - Vascular Surgery  - Vascular Ultrasound  5th Floor: - [facility] (general and  EP) - Clinical Pharmacy for coumadin, hypertension, lipid, weight-loss medications, and med management appointments    Valet parking services will be available as well.

## 2023-07-19 NOTE — Progress Notes (Signed)
 OFFICE CONSULT NOTE  Chief Complaint:  Follow-up   Primary Care Physician: Lorenda Ishihara, MD  HPI:  Vanessa Frank is a 78 y.o. female who is being seen today for the evaluation of chest pain at the request of Lorenda Ishihara,*.  This is a pleasant 78 year old female kindly referred for evaluation management of chest pain.  She was just seen in the ER on March 19 for a chest pain equivalent.  She actually describes it as chest pressure but more across her back.  The symptoms were fairly constant.  She does say somewhat worse with exertion and.  She does have a history of fibromyalgia and chronic pain as well as type 2 diabetes and hypertension and there is family history of heart disease in her mother who had angina.  She apparently had work-up for similar symptoms about 5 or 6 years ago at [facility] cardiology.  After some discussion we determined she saw Dr. Carolanne Grumbling and had stress testing.  Those results are not in the epic charting system.  Apparently everything was negative.  Rule out for MI.  She had an EKG which showed septal infarct however this just indicates Q waves in V1 and V2 but I explained to them that she would need involvement of lead V3 for true septal infarct and I doubt that she has had prior infarct.  The pain across her back is somewhat concerning for possibly aortic pathology although blood pressure seems to be well controlled.  She did have a chest x-ray which showed no evidence of widened mediastinum or any cardiopulmonary disease.  A CT was not performed.  Troponin was negative x2.  D-dimer was also negative  08/28/2020  [name] returns today for follow-up.  She underwent a coronary CT angiogram which showed a calcium score 351, 80th percentile for age and sex matched controls.  She did was noted to have some minimal coronary artery disease in the left main, ramus and RCA vessels.  Overall this does not describe a reason for her chest pain.  Maximal  medical therapy was recommended.  She does have good blood pressure control.  Recent lipids in April showed total cholesterol 166, HDL 83, LDL 61 and triglycerides 131 on low-dose atorvastatin 10 mg nightly.  She does report significant fatigue.  We talked about possible options including a sleep disorder.  She had an Epworth Sleepiness Scale performed which scored a 9.  She reports some nonrestorative sleep at night as well as snoring.  07/19/2023  [name] returns today for follow-up.  It has been a few years since I last saw her.  She had coronary CT angiography which showed a high calcium score but nonobstructive coronary disease.  She is only been on 10 mg atorvastatin however her LDL still remains high.  More recently LDL was 103 with total cholesterol 193, HDL 64 and triglycerides 147.  Unfortunately sounds like she was sick for most of the months of January and February with multiple respiratory viruses including a double pneumonia, RSV, COVID and bronchitis.  She seems to have now recovered.  Throughout this she has not had any cardiac chest pain symptoms.  She gets occasional palpitations.  She was supposed to have a home sleep study however that failed to work correctly and she became frustrated and decided not to follow through with an in-house sleep study.  She reports continued fatigue.  PMHx:  Past Medical History:  Diagnosis Date   Anxiety    Arthritis  Diabetes mellitus ORAL MEDS ONLY   Dysrhythmia IRREGULAR  RATE IN HER 20'S   Fibromyalgia    GERD (gastroesophageal reflux disease)    Headache(784.0)    Hypertension    Neuropathy    TOES   Palpitations    OCCASIONAL   Shortness of breath WITH EXERTION   Stress incontinence     Past Surgical History:  Procedure Laterality Date   ABDOMINAL HYSTERECTOMY  80'S   Partial    ANTERIOR LAT LUMBAR FUSION  03/26/2011   Procedure: ANTERIOR LATERAL LUMBAR FUSION 1 LEVEL;  Surgeon: Tia Alert;  Location: [facility];  Service:  Neurosurgery;  Laterality: Left;  Left Lumbar three-four anterolateral retroperitoneal interbody fusion    ARTHROSCOPY KNEE W/ DRILLING  09 LEFT   2012 RIGHT   BARTHOLIN GLAND CYST EXCISION  1983   CHOLECYSTECTOMY  1989   CYSTOSCOPY  80'S   FOOT SURGERY Right    SPINAL FUSION  2012   Lower Back   TOTAL KNEE ARTHROPLASTY Left 05/28/2013   Procedure: LEFT TOTAL KNEE ARTHROPLASTY;  Surgeon: Loanne Drilling, MD;  Location: [facility];  Service: Orthopedics;  Laterality: Left;   TOTAL KNEE ARTHROPLASTY Right 10/29/2013   Procedure: RIGHT TOTAL KNEE ARTHROPLASTY;  Surgeon: Loanne Drilling, MD;  Location: [facility];  Service: Orthopedics;  Laterality: Right;    FAMHx:  Family History  Problem Relation Age of Onset   Depression Mother    Congestive Heart Failure Mother    Allergy (severe) Mother    Heart disease Mother    Alcoholism Mother    Hypertension Mother    Osteoarthritis Mother    Congestive Heart Failure Father    Cancer Father    Depression Maternal Grandmother    Osteoarthritis Maternal Grandmother    Depression Sister    Rheum arthritis Paternal Grandmother    Osteoarthritis Paternal Grandmother    Osteoarthritis Sister    Clotting disorder Sister     SOCHx:   reports that she has never smoked. She has never used smokeless tobacco. She reports that she does not drink alcohol and does not use drugs.  ALLERGIES:  Allergies  Allergen Reactions   Adhesive [Tape] Itching   Codeine Nausea Only    Can tolerate with antinausea meds   Latex Itching   Chocolate Rash    Per allergy Test  - Pt has not experienced any symptoms     ROS: Pertinent items noted in HPI and remainder of comprehensive ROS otherwise negative.  HOME MEDS: Current Outpatient Medications on File Prior to Visit  Medication Sig Dispense Refill   atorvastatin (LIPITOR) 10 MG tablet Take 10 mg by mouth at bedtime.     B Complex Vitamins (VITAMIN B COMPLEX) TABS Take 1 tablet by mouth 3 (three) times a week.      carboxymethylcellulose (REFRESH PLUS) 0.5 % SOLN Place 1 drop into both eyes 3 (three) times daily as needed (dry eyes).     ELDERBERRY PO Take 1 each by mouth every other day.     leflunomide (ARAVA) 20 MG tablet Take 20 mg by mouth daily.     lisinopril (ZESTRIL) 20 MG tablet Take 20 mg by mouth daily.     melatonin 5 MG TABS Take 5 mg by mouth at bedtime.     metFORMIN (GLUCOPHAGE-XR) 500 MG 24 hr tablet Take 500 mg by mouth 2 (two) times daily with a meal.     mirabegron ER (MYRBETRIQ) 25 MG TB24 tablet Take 1  tablet (25 mg total) by mouth daily. 30 tablet 11   Multiple Vitamin (MULTIVITAMIN) tablet Take 1 tablet by mouth 3 (three) times a week.     oxyCODONE-acetaminophen (PERCOCET) 10-325 MG tablet Take 1-2 tablets by mouth daily as needed for pain.  0   pioglitazone (ACTOS) 30 MG tablet TAKE 1 TABLET BY MOUTH EVERY MORNING (Patient taking differently: Take 30 mg by mouth daily.) 90 tablet 0   sertraline (ZOLOFT) 100 MG tablet Take 100 mg by mouth every morning.     albuterol (VENTOLIN HFA) 108 (90 Base) MCG/ACT inhaler Inhale 1-2 puffs into the lungs every 6 (six) hours as needed for wheezing or shortness of breath. (Patient not taking: Reported on 07/19/2023) 1 each 0   amoxicillin-clavulanate (AUGMENTIN) 875-125 MG tablet Take 1 tablet by mouth every 12 (twelve) hours. (Patient not taking: Reported on 07/19/2023) 14 tablet 0   azithromycin (ZITHROMAX) 250 MG tablet Take 1 tablet (250 mg total) by mouth daily. (Patient not taking: Reported on 07/19/2023) 4 tablet 0   benzonatate (TESSALON) 100 MG capsule Take 1 capsule (100 mg total) by mouth 3 (three) times daily as needed. (Patient not taking: Reported on 07/19/2023) 12 capsule 0   guaiFENesin (MUCINEX) 600 MG 12 hr tablet Take 1 tablet (600 mg total) by mouth 2 (two) times daily. (Patient not taking: Reported on 07/19/2023) 30 tablet 0   JARDIANCE 10 MG TABS tablet Take 10 mg by mouth daily. (Patient not taking: Reported on 07/19/2023)      lisinopril (PRINIVIL,ZESTRIL) 10 MG tablet Take 10 mg by mouth every morning. (Patient not taking: Reported on 07/19/2023)     predniSONE (DELTASONE) 10 MG tablet Take 3 tablets (30 mg total) by mouth daily with breakfast. (Patient not taking: Reported on 07/19/2023) 15 tablet 0   promethazine-dextromethorphan (PROMETHAZINE-DM) 6.25-15 MG/5ML syrup Take 2.5 mLs by mouth 3 (three) times daily as needed for cough. (Patient not taking: Reported on 07/19/2023) 100 mL 0   No current facility-administered medications on file prior to visit.    LABS/IMAGING: No results found for this or any previous visit (from the past 48 hours). No results found.  LIPID PANEL: No results found for: "CHOL", "TRIG", "HDL", "CHOLHDL", "VLDL", "LDLCALC", "LDLDIRECT"  WEIGHTS: Wt Readings from Last 3 Encounters:  07/19/23 196 lb (88.9 kg)  03/15/22 181 lb 6.4 oz (82.3 kg)  03/12/22 181 lb 6.4 oz (82.3 kg)    VITALS: BP 118/62 (BP Location: Left Arm, Patient Position: Sitting, Cuff Size: Normal)   Pulse 89   Ht 5\' 2"  (1.575 m)   Wt 196 lb (88.9 kg)   SpO2 96%   BMI 35.85 kg/m   EXAM: General appearance: alert and no distress Neck: no carotid bruit, no JVD, and thyroid not enlarged, symmetric, no tenderness/mass/nodules Lungs: clear to auscultation bilaterally Heart: regular rate and rhythm, S1, S2 normal, no murmur, click, rub or gallop Abdomen: soft, non-tender; bowel sounds normal; no masses,  no organomegaly Extremities: extremities normal, atraumatic, no cyanosis or edema Pulses: 2+ and symmetric Skin: Skin color, texture, turgor normal. No rashes or lesions Neurologic: Grossly normal Psych: Pleasant  EKG: Deferred  ASSESSMENT: Chest and upper back pain -mild nonobstructive coronary disease by cardiac CT, calcium score 351, 80th percentile (07/2020) Type 2 diabetes Hypertension Dyslipidemia Family history of CAD in mom Fatigue/nonrestorative sleep  PLAN: 1.   Ms. Brull seems to be doing better  unfortunately had multiple respiratory infections recently.  No clear anginal chest pain.  Her lipids however not  as well-controlled.  LDL now over 100 with a target less than 70.  She should be on high intensity statin therapy per guidelines and I advise increasing her atorvastatin to 40 mg at night.  Will prescribe that today and plan repeat lipids including NMR and LPA in 3 to 4 months at that time she can follow-up then.  She did say that she would consider an in-house sleep study at some point and can contact us if she wants to move forward with it.  Chrystie Nose, MD, [facility], FACP  [place]  [facility]  Medical Director of the [facility]  Cardiovascular Risk Reduction [facility] Diplomate of the American Board of Clinical Lipidology Attending Cardiologist  Direct Dial: [phone]  Fax: [phone]  Website:  [email or web address]  Chrystie Nose 07/19/2023, 9:19 AM

## 2023-08-01 ENCOUNTER — Telehealth: Payer: Self-pay | Admitting: Internal Medicine

## 2023-08-01 NOTE — Telephone Encounter (Signed)
  Per staff message, left three messages for patient to schedule 3-4 month f/u with Hilty or Swinyer. Mailed recall letter today since patient has not responded to our attempts to schedule

## 2023-08-22 ENCOUNTER — Other Ambulatory Visit: Payer: Self-pay

## 2023-08-22 ENCOUNTER — Emergency Department (HOSPITAL_BASED_OUTPATIENT_CLINIC_OR_DEPARTMENT_OTHER)
Admission: EM | Admit: 2023-08-22 | Discharge: 2023-08-22 | Disposition: A | Discharge status: Home / Self Care | Attending: Emergency Medicine | Admitting: Emergency Medicine

## 2023-08-22 ENCOUNTER — Encounter (HOSPITAL_BASED_OUTPATIENT_CLINIC_OR_DEPARTMENT_OTHER): Payer: Self-pay | Admitting: Emergency Medicine

## 2023-08-22 ENCOUNTER — Emergency Department (HOSPITAL_BASED_OUTPATIENT_CLINIC_OR_DEPARTMENT_OTHER)

## 2023-08-22 DIAGNOSIS — R109 Unspecified abdominal pain: Secondary | ICD-10-CM | POA: Diagnosis not present

## 2023-08-22 DIAGNOSIS — Z91040 Latex allergy status: Secondary | ICD-10-CM | POA: Diagnosis not present

## 2023-08-22 DIAGNOSIS — I10 Essential (primary) hypertension: Secondary | ICD-10-CM | POA: Diagnosis not present

## 2023-08-22 DIAGNOSIS — K5792 Diverticulitis of intestine, part unspecified, without perforation or abscess without bleeding: Secondary | ICD-10-CM | POA: Insufficient documentation

## 2023-08-22 DIAGNOSIS — E119 Type 2 diabetes mellitus without complications: Secondary | ICD-10-CM | POA: Diagnosis not present

## 2023-08-22 DIAGNOSIS — K5730 Diverticulosis of large intestine without perforation or abscess without bleeding: Secondary | ICD-10-CM | POA: Diagnosis not present

## 2023-08-22 DIAGNOSIS — Z79899 Other long term (current) drug therapy: Secondary | ICD-10-CM | POA: Insufficient documentation

## 2023-08-22 DIAGNOSIS — Z7984 Long term (current) use of oral hypoglycemic drugs: Secondary | ICD-10-CM | POA: Diagnosis not present

## 2023-08-22 DIAGNOSIS — R1032 Left lower quadrant pain: Secondary | ICD-10-CM | POA: Diagnosis present

## 2023-08-22 LAB — CBC
HCT: 37.4 % (ref 36.0–46.0)
Hemoglobin: 12.8 g/dL (ref 12.0–15.0)
MCH: 29.4 pg (ref 26.0–34.0)
MCHC: 34.2 g/dL (ref 30.0–36.0)
MCV: 86 fL (ref 80.0–100.0)
Platelets: 272 10*3/uL (ref 150–400)
RBC: 4.35 MIL/uL (ref 3.87–5.11)
RDW: 13.3 % (ref 11.5–15.5)
WBC: 6.7 10*3/uL (ref 4.0–10.5)
nRBC: 0 % (ref 0.0–0.2)

## 2023-08-22 LAB — URINALYSIS, MICROSCOPIC (REFLEX)
RBC / HPF: NONE SEEN RBC/hpf (ref 0–5)
WBC, UA: NONE SEEN WBC/hpf (ref 0–5)

## 2023-08-22 LAB — URINALYSIS, ROUTINE W REFLEX MICROSCOPIC
Bilirubin Urine: NEGATIVE
Glucose, UA: >=500 mg/dL — AB
Hgb urine dipstick: NEGATIVE
Ketones, ur: NEGATIVE mg/dL
Leukocytes,Ua: NEGATIVE
Nitrite: NEGATIVE
Protein, ur: NEGATIVE mg/dL
Specific Gravity, Urine: 1.015 (ref 1.005–1.030)
pH: 5.5 (ref 5.0–8.0)

## 2023-08-22 LAB — COMPREHENSIVE METABOLIC PANEL WITH GFR
ALT: 13 U/L (ref 0–44)
AST: 19 U/L (ref 15–41)
Albumin: 4.4 g/dL (ref 3.5–5.0)
Alkaline Phosphatase: 73 U/L (ref 38–126)
Anion gap: 13 (ref 5–15)
BUN: 32 mg/dL — ABNORMAL HIGH (ref 8–23)
CO2: 22 mmol/L (ref 22–32)
Calcium: 9.8 mg/dL (ref 8.9–10.3)
Chloride: 99 mmol/L (ref 98–111)
Creatinine, Ser: 1.27 mg/dL — ABNORMAL HIGH (ref 0.44–1.00)
GFR, Estimated: 43 mL/min — ABNORMAL LOW (ref >60)
Glucose, Bld: 169 mg/dL — ABNORMAL HIGH (ref 70–99)
Potassium: 4.4 mmol/L (ref 3.5–5.1)
Sodium: 135 mmol/L (ref 135–145)
Total Bilirubin: 0.4 mg/dL (ref 0.0–1.2)
Total Protein: 7.1 g/dL (ref 6.5–8.1)

## 2023-08-22 LAB — LIPASE, BLOOD: Lipase: 39 U/L (ref 11–51)

## 2023-08-22 NOTE — Discharge Instructions (Addendum)
 Thank you for coming to [facility] Emergency Department. You were seen for abdominal pain. We did an exam, labs, and imaging, and these showed diverticulitis, or inflammation of the diverticula, and outpouching in the colon.  Please adhere to a liquid diet with bowel rest -low fiber foods -for the next 2 to 3 days to allow your intestines time to heal.  You can take Tylenol  1000 mg every 8 hours for pain. Please follow up with your primary care provider tomorrow as previously scheduled.   Do not hesitate to return to the ED or call 911 if you experience: -Worsening symptoms -Nausea vomiting so severe you cannot eat or drink anything -Lightheadedness, passing out -Fevers/chills -Anything else that concerns you

## 2023-08-22 NOTE — ED Triage Notes (Signed)
 Pt POV slow gait- c/o L flank and abd pain, worse with bending and movement, since Friday.  Pt also reports mild ShOB since side pain began, also feels bloated.   Denies n/v/d, urinary sx.  LBM today, normal.

## 2023-08-22 NOTE — ED Provider Notes (Signed)
 [facility] Provider Note   CSN: [id number] Arrival date & time: 08/22/23  1745     History  Chief Complaint  Patient presents with   Flank Pain    Vanessa Frank is a 78 y.o. female with PMH as listed below who presents with  L lower quadrant abdominal pain and left side pain, worse with bending and movement, since Friday.  Denies chest pain, shortness of breath.  Reports chronic lower back pain that is the same as it always is. Denies n/v/d, urinary sx.  LBM today, normal.    Past Medical History:  Diagnosis Date   Anxiety    Arthritis    Diabetes mellitus ORAL MEDS ONLY   Dysrhythmia IRREGULAR  RATE IN HER 20'S   Fibromyalgia    GERD (gastroesophageal reflux disease)    Headache(784.0)    Hypertension    Neuropathy    TOES   Palpitations    OCCASIONAL   Shortness of breath WITH EXERTION   Stress incontinence        Home Medications Prior to Admission medications   Medication Sig Start Date End Date Taking? Authorizing Provider  albuterol  (VENTOLIN  HFA) 108 (90 Base) MCG/ACT inhaler Inhale 1-2 puffs into the lungs every 6 (six) hours as needed for wheezing or shortness of breath. Patient not taking: Reported on 07/19/2023 05/07/23   Starlene Eaton, FNP  amoxicillin -clavulanate (AUGMENTIN ) 875-125 MG tablet Take 1 tablet by mouth every 12 (twelve) hours. Patient not taking: Reported on 07/19/2023 05/13/23   Kelsey Patricia, MD  atorvastatin  (LIPITOR) 40 MG tablet Take 1 tablet (40 mg total) by mouth at bedtime. For cholesterol 07/19/23   Hilty, Aviva Lemmings, MD  azithromycin  (ZITHROMAX ) 250 MG tablet Take 1 tablet (250 mg total) by mouth daily. Patient not taking: Reported on 07/19/2023 05/13/23   Kelsey Patricia, MD  B Complex Vitamins (VITAMIN B COMPLEX) TABS Take 1 tablet by mouth 3 (three) times a week.    [provider]  benzonatate  (TESSALON ) 100 MG capsule Take 1 capsule (100 mg total) by mouth 3 (three) times  daily as needed. Patient not taking: Reported on 07/19/2023 05/13/23   Kelsey Patricia, MD  carboxymethylcellulose (REFRESH PLUS) 0.5 % SOLN Place 1 drop into both eyes 3 (three) times daily as needed (dry eyes).    [provider]  ELDERBERRY PO Take 1 each by mouth every other day.    [provider]  guaiFENesin  (MUCINEX ) 600 MG 12 hr tablet Take 1 tablet (600 mg total) by mouth 2 (two) times daily. Patient not taking: Reported on 07/19/2023 05/07/23   Starlene Eaton, FNP  JARDIANCE 10 MG TABS tablet Take 10 mg by mouth daily. Patient not taking: Reported on 07/19/2023 07/16/23   [provider]  leflunomide (ARAVA) 20 MG tablet Take 20 mg by mouth daily.    [provider]  lisinopril  (PRINIVIL ,ZESTRIL ) 10 MG tablet Take 10 mg by mouth every morning. Patient not taking: Reported on 07/19/2023    [provider]  lisinopril  (ZESTRIL ) 20 MG tablet Take 20 mg by mouth daily. 05/10/23   [provider]  melatonin 5 MG TABS Take 5 mg by mouth at bedtime.    [provider]  metFORMIN  (GLUCOPHAGE -XR) 500 MG 24 hr tablet Take 500 mg by mouth 2 (two) times daily with a meal.    [provider]  mirabegron  ER (MYRBETRIQ ) 25 MG TB24 tablet Take 1 tablet (25 mg total) by  mouth daily. 10/27/22   Zuleta, Kaitlin G, NP  Multiple Vitamin (MULTIVITAMIN) tablet Take 1 tablet by mouth 3 (three) times a week.    [provider]  oxyCODONE -acetaminophen  (PERCOCET) 10-325 MG tablet Take 1-2 tablets by mouth daily as needed for pain. 06/15/17   [provider]  pioglitazone  (ACTOS ) 30 MG tablet TAKE 1 TABLET BY MOUTH EVERY MORNING Patient taking differently: Take 30 mg by mouth daily. 03/18/14   [name], [name], DO  predniSONE  (DELTASONE ) 10 MG tablet Take 3 tablets (30 mg total) by mouth daily with breakfast. Patient not taking: Reported on 07/19/2023 05/13/22   Adolph Hoop, PA-C  promethazine -dextromethorphan  (PROMETHAZINE -DM)  6.25-15 MG/5ML syrup Take 2.5 mLs by mouth 3 (three) times daily as needed for cough. Patient not taking: Reported on 07/19/2023 05/13/22   Adolph Hoop, PA-C  sertraline  (ZOLOFT ) 100 MG tablet Take 100 mg by mouth every morning.    [provider]      Allergies    Adhesive [tape], Codeine, Latex, and Chocolate    Review of Systems   Review of Systems A 10 point review of systems was performed and is negative unless otherwise reported in HPI.  Physical Exam Updated Vital Signs BP (!) 121/101   Pulse 81   Temp 97.6 F (36.4 C)   Resp 18   Ht 5\' 1"  (1.549 m)   Wt 88.9 kg   SpO2 99%   BMI 37.03 kg/m  Physical Exam General: Normal appearing female, lying in bed.  HEENT: PERRLA, Sclera anicteric, MMM, trachea midline.  Cardiology: RRR, no murmurs/rubs/gallops. Resp: Normal respiratory rate and effort. CTAB, no wheezes, rhonchi, crackles.  Abd: Soft, +LLQ abd TTP, non-distended. No rebound tenderness or guarding.  GU: Deferred. MSK: No peripheral edema or signs of trauma. Skin: warm, dry.  Back: No CVA tenderness Neuro: A&Ox4, CNs II-XII grossly intact. MAEs. Sensation grossly intact.  Psych: Normal mood and affect.   ED Results / Procedures / Treatments   Labs (all labs ordered are listed, but only abnormal results are displayed) Labs Reviewed  COMPREHENSIVE METABOLIC PANEL WITH GFR - Abnormal; Notable for the following components:      Result Value   Glucose, Bld 169 (*)    BUN 32 (*)    Creatinine, Ser 1.27 (*)    GFR, Estimated 43 (*)    All other components within normal limits  URINALYSIS, ROUTINE W REFLEX MICROSCOPIC - Abnormal; Notable for the following components:   Glucose, UA >=500 (*)    All other components within normal limits  URINALYSIS, MICROSCOPIC (REFLEX) - Abnormal; Notable for the following components:   Bacteria, UA RARE (*)    All other components within normal limits  LIPASE, BLOOD  CBC    EKG None  Radiology CT Renal Stone  Study Result Date: 08/22/2023 CLINICAL DATA:  Abdominal pain, left flank pain EXAM: CT ABDOMEN AND PELVIS WITHOUT CONTRAST TECHNIQUE: Multidetector CT imaging of the abdomen and pelvis was performed following the standard protocol without IV contrast. RADIATION DOSE REDUCTION: This exam was performed according to the departmental dose-optimization program which includes automated exposure control, adjustment of the mA and/or kV according to patient size and/or use of iterative reconstruction technique. COMPARISON:  07/26/2017 FINDINGS: Lower chest: No acute findings Hepatobiliary: Scattered calcifications in the liver, likely old granulomatous disease. Prior cholecystectomy. No biliary ductal dilatation or suspicious focal hepatic abnormality. Pancreas: No focal abnormality or ductal dilatation. Spleen: Normal size.  Scattered calcifications. Adrenals/Urinary Tract: No adrenal abnormality. No focal renal  abnormality. No stones or hydronephrosis. Urinary bladder is unremarkable. Stomach/Bowel: Left colonic diverticulosis. Slight inflammatory stranding around the mid to distal descending colon, possible early active diverticulitis. Stomach and small bowel decompressed. Vascular/Lymphatic: Aortic atherosclerosis. No evidence of aneurysm or adenopathy. Reproductive: Prior hysterectomy.  No adnexal masses. Other: No free fluid or free air. Musculoskeletal: Postoperative and degenerative changes in the lumbar spine. No acute bony abnormality. IMPRESSION: No renal or ureteral stones.  No hydronephrosis. Left colonic diverticulosis. Slight inflammatory stranding around the mid to distal descending colon could reflect early active diverticulitis. Old granulomatous disease. Aortic atherosclerosis. Electronically Signed   By: Janeece Mechanic M.D.   On: 08/22/2023 19:22    Procedures Procedures    Medications Ordered in ED Medications - No data to display  ED Course/ Medical Decision Making/ A&P                           Medical Decision Making Amount and/or Complexity of Data Reviewed Labs: ordered. Decision-making details documented in ED Course. Radiology: ordered. Decision-making details documented in ED Course.    This patient presents to the ED for concern of LLQ abd pain, this involves an extensive number of treatment options, and is a complaint that carries with it a high risk of complications and morbidity.  I considered the following differential and admission for this acute, potentially life threatening condition.  Patient is hemodynamically stable and very well-appearing.  MDM:    For DDX for abdominal pain includes but is not limited to:  Abdominal exam without peritoneal signs. No evidence of acute abdomen at this time. Low suspicion for acute hepatobiliary disease (including acute cholecystitis or cholangitis), acute pancreatitis (neg lipase),acute appendicitis, vascular catastrophe, bowel obstruction.  Patient's CT demonstrates uncomplicated diverticulitis.  She has no nausea or vomiting.  No fever or chills, no leukocytosis to indicate sepsis or life-threatening bacterial infection.  Patient will be discharged with instructions for liquid diet and bowel rest.  She already has follow-up with her primary care physician scheduled for tomorrow.   Clinical Course as of 08/22/23 2016  Mon Aug 22, 2023  1947 CT Renal Stone Study No renal or ureteral stones.  No hydronephrosis.  Left colonic diverticulosis. Slight inflammatory stranding around the mid to distal descending colon could reflect early active diverticulitis.  Old granulomatous disease.   [HN]  2002 Urinalysis, Routine w reflex microscopic -Urine, Clean Catch(!) No UTI [HN]    Clinical Course User Index [HN] Merdis Stalling, MD    Labs: I Ordered, and personally interpreted labs.  The pertinent results include: Those listed above, and creatinine near baseline,  no significant electrolyte derangements.  Imaging Studies  ordered: I ordered imaging studies including CT renal stone study I independently visualized and interpreted imaging. I agree with the radiologist interpretation  Additional history obtained from chart review.  Cardiac Monitoring: The patient was maintained on a cardiac monitor.  I personally viewed and interpreted the cardiac monitored which showed an underlying rhythm of: Normal sinus rhythm  Social Determinants of Health:  lives independently  Disposition:  DC w/ discharge instructions/return precautions. All questions answered to patient's satisfaction.    Co morbidities that complicate the patient evaluation  Past Medical History:  Diagnosis Date   Anxiety    Arthritis    Diabetes mellitus ORAL MEDS ONLY   Dysrhythmia IRREGULAR  RATE IN HER 20'S   Fibromyalgia    GERD (gastroesophageal reflux disease)    Headache(784.0)  Hypertension    Neuropathy    TOES   Palpitations    OCCASIONAL   Shortness of breath WITH EXERTION   Stress incontinence      Medicines No orders of the defined types were placed in this encounter.   I have reviewed the patients home medicines and have made adjustments as needed  Problem List / ED Course: Problem List Items Addressed This Visit   None Visit Diagnoses       Diverticulitis    -  Primary                   This note was created using dictation software, which may contain spelling or grammatical errors.    Merdis Stalling, MD 08/22/23 2018

## 2023-08-23 DIAGNOSIS — E1169 Type 2 diabetes mellitus with other specified complication: Secondary | ICD-10-CM | POA: Diagnosis not present

## 2023-08-23 DIAGNOSIS — N1832 Chronic kidney disease, stage 3b: Secondary | ICD-10-CM | POA: Diagnosis not present

## 2023-08-23 DIAGNOSIS — K5792 Diverticulitis of intestine, part unspecified, without perforation or abscess without bleeding: Secondary | ICD-10-CM | POA: Diagnosis not present

## 2023-08-23 DIAGNOSIS — G894 Chronic pain syndrome: Secondary | ICD-10-CM | POA: Diagnosis not present

## 2023-09-28 DIAGNOSIS — M47816 Spondylosis without myelopathy or radiculopathy, lumbar region: Secondary | ICD-10-CM | POA: Diagnosis not present

## 2023-09-28 DIAGNOSIS — M48061 Spinal stenosis, lumbar region without neurogenic claudication: Secondary | ICD-10-CM | POA: Diagnosis not present

## 2023-09-28 DIAGNOSIS — G894 Chronic pain syndrome: Secondary | ICD-10-CM | POA: Diagnosis not present

## 2023-10-05 DIAGNOSIS — H04123 Dry eye syndrome of bilateral lacrimal glands: Secondary | ICD-10-CM | POA: Diagnosis not present

## 2023-10-05 DIAGNOSIS — H26492 Other secondary cataract, left eye: Secondary | ICD-10-CM | POA: Diagnosis not present

## 2023-10-05 DIAGNOSIS — H524 Presbyopia: Secondary | ICD-10-CM | POA: Diagnosis not present

## 2023-10-05 DIAGNOSIS — H35372 Puckering of macula, left eye: Secondary | ICD-10-CM | POA: Diagnosis not present

## 2023-10-05 DIAGNOSIS — H5203 Hypermetropia, bilateral: Secondary | ICD-10-CM | POA: Diagnosis not present

## 2023-10-05 DIAGNOSIS — H18413 Arcus senilis, bilateral: Secondary | ICD-10-CM | POA: Diagnosis not present

## 2023-10-05 DIAGNOSIS — Z961 Presence of intraocular lens: Secondary | ICD-10-CM | POA: Diagnosis not present

## 2023-10-05 DIAGNOSIS — H52203 Unspecified astigmatism, bilateral: Secondary | ICD-10-CM | POA: Diagnosis not present

## 2023-10-05 DIAGNOSIS — E119 Type 2 diabetes mellitus without complications: Secondary | ICD-10-CM | POA: Diagnosis not present

## 2023-10-12 DIAGNOSIS — N1832 Chronic kidney disease, stage 3b: Secondary | ICD-10-CM | POA: Diagnosis not present

## 2023-10-12 DIAGNOSIS — R262 Difficulty in walking, not elsewhere classified: Secondary | ICD-10-CM | POA: Diagnosis not present

## 2023-10-12 DIAGNOSIS — Z1331 Encounter for screening for depression: Secondary | ICD-10-CM | POA: Diagnosis not present

## 2023-10-12 DIAGNOSIS — N3946 Mixed incontinence: Secondary | ICD-10-CM | POA: Diagnosis not present

## 2023-10-12 DIAGNOSIS — E1169 Type 2 diabetes mellitus with other specified complication: Secondary | ICD-10-CM | POA: Diagnosis not present

## 2023-10-12 DIAGNOSIS — G894 Chronic pain syndrome: Secondary | ICD-10-CM | POA: Diagnosis not present

## 2023-10-12 DIAGNOSIS — I10 Essential (primary) hypertension: Secondary | ICD-10-CM | POA: Diagnosis not present

## 2023-10-12 DIAGNOSIS — F330 Major depressive disorder, recurrent, mild: Secondary | ICD-10-CM | POA: Diagnosis not present

## 2023-10-12 DIAGNOSIS — Z23 Encounter for immunization: Secondary | ICD-10-CM | POA: Diagnosis not present

## 2023-10-12 DIAGNOSIS — Z0000 Encounter for general adult medical examination without abnormal findings: Secondary | ICD-10-CM | POA: Diagnosis not present

## 2023-11-20 ENCOUNTER — Other Ambulatory Visit: Payer: Self-pay | Admitting: Obstetrics and Gynecology

## 2023-11-20 DIAGNOSIS — N3281 Overactive bladder: Secondary | ICD-10-CM

## 2023-11-23 NOTE — Telephone Encounter (Signed)
 Pt needs a yearly follow up for continued refills.

## 2023-12-07 DIAGNOSIS — M533 Sacrococcygeal disorders, not elsewhere classified: Secondary | ICD-10-CM | POA: Diagnosis not present

## 2023-12-07 DIAGNOSIS — M47816 Spondylosis without myelopathy or radiculopathy, lumbar region: Secondary | ICD-10-CM | POA: Diagnosis not present

## 2023-12-07 DIAGNOSIS — G894 Chronic pain syndrome: Secondary | ICD-10-CM | POA: Diagnosis not present

## 2024-02-14 DIAGNOSIS — W19XXXA Unspecified fall, initial encounter: Secondary | ICD-10-CM | POA: Diagnosis not present

## 2024-02-14 DIAGNOSIS — R262 Difficulty in walking, not elsewhere classified: Secondary | ICD-10-CM | POA: Diagnosis not present

## 2024-02-14 DIAGNOSIS — N189 Chronic kidney disease, unspecified: Secondary | ICD-10-CM | POA: Diagnosis not present

## 2024-02-14 DIAGNOSIS — I10 Essential (primary) hypertension: Secondary | ICD-10-CM | POA: Diagnosis not present

## 2024-02-14 DIAGNOSIS — E1169 Type 2 diabetes mellitus with other specified complication: Secondary | ICD-10-CM | POA: Diagnosis not present

## 2024-02-14 DIAGNOSIS — G894 Chronic pain syndrome: Secondary | ICD-10-CM | POA: Diagnosis not present

## 2024-02-14 DIAGNOSIS — Z23 Encounter for immunization: Secondary | ICD-10-CM | POA: Diagnosis not present

## 2024-02-14 DIAGNOSIS — N1832 Chronic kidney disease, stage 3b: Secondary | ICD-10-CM | POA: Diagnosis not present

## 2024-02-17 ENCOUNTER — Other Ambulatory Visit: Payer: Self-pay | Admitting: Obstetrics and Gynecology

## 2024-02-17 DIAGNOSIS — N3281 Overactive bladder: Secondary | ICD-10-CM

## 2024-03-19 ENCOUNTER — Other Ambulatory Visit: Payer: Self-pay | Admitting: Obstetrics and Gynecology

## 2024-03-19 DIAGNOSIS — N3281 Overactive bladder: Secondary | ICD-10-CM

## 2024-04-01 ENCOUNTER — Other Ambulatory Visit: Payer: Self-pay | Admitting: Obstetrics and Gynecology

## 2024-04-01 DIAGNOSIS — N3281 Overactive bladder: Secondary | ICD-10-CM

## 2024-04-16 ENCOUNTER — Encounter: Payer: Self-pay | Admitting: Nurse Practitioner

## 2024-04-23 ENCOUNTER — Other Ambulatory Visit: Payer: Self-pay | Admitting: Obstetrics and Gynecology

## 2024-04-23 ENCOUNTER — Other Ambulatory Visit: Payer: Self-pay | Admitting: Internal Medicine

## 2024-04-23 DIAGNOSIS — N3281 Overactive bladder: Secondary | ICD-10-CM

## 2024-04-30 ENCOUNTER — Encounter: Payer: Self-pay | Admitting: *Deleted
